# Patient Record
Sex: Male | Born: 1950 | Race: White | Hispanic: No | Marital: Married | Smoking: Never smoker
Health system: Southern US, Community
[De-identification: ages and names within clinical notes are randomized; demographics above are authoritative.]

## PROBLEM LIST (undated history)

## (undated) DIAGNOSIS — Z8719 Personal history of other diseases of the digestive system: Secondary | ICD-10-CM

## (undated) DIAGNOSIS — F329 Major depressive disorder, single episode, unspecified: Secondary | ICD-10-CM

## (undated) DIAGNOSIS — G4730 Sleep apnea, unspecified: Secondary | ICD-10-CM

## (undated) DIAGNOSIS — I10 Essential (primary) hypertension: Secondary | ICD-10-CM

## (undated) DIAGNOSIS — I77810 Thoracic aortic ectasia: Secondary | ICD-10-CM

## (undated) DIAGNOSIS — E785 Hyperlipidemia, unspecified: Secondary | ICD-10-CM

## (undated) DIAGNOSIS — R739 Hyperglycemia, unspecified: Secondary | ICD-10-CM

## (undated) DIAGNOSIS — I219 Acute myocardial infarction, unspecified: Secondary | ICD-10-CM

## (undated) DIAGNOSIS — F419 Anxiety disorder, unspecified: Secondary | ICD-10-CM

## (undated) DIAGNOSIS — R42 Dizziness and giddiness: Secondary | ICD-10-CM

## (undated) DIAGNOSIS — IMO0001 Reserved for inherently not codable concepts without codable children: Secondary | ICD-10-CM

## (undated) DIAGNOSIS — N529 Male erectile dysfunction, unspecified: Secondary | ICD-10-CM

## (undated) DIAGNOSIS — R011 Cardiac murmur, unspecified: Secondary | ICD-10-CM

## (undated) DIAGNOSIS — I712 Thoracic aortic aneurysm, without rupture: Secondary | ICD-10-CM

## (undated) DIAGNOSIS — I2510 Atherosclerotic heart disease of native coronary artery without angina pectoris: Secondary | ICD-10-CM

## (undated) DIAGNOSIS — F32A Depression, unspecified: Secondary | ICD-10-CM

## (undated) DIAGNOSIS — K219 Gastro-esophageal reflux disease without esophagitis: Secondary | ICD-10-CM

## (undated) DIAGNOSIS — E7800 Pure hypercholesterolemia, unspecified: Secondary | ICD-10-CM

## (undated) DIAGNOSIS — Q231 Congenital insufficiency of aortic valve: Secondary | ICD-10-CM

## (undated) DIAGNOSIS — I7121 Aneurysm of the ascending aorta, without rupture: Secondary | ICD-10-CM

## (undated) DIAGNOSIS — I7090 Unspecified atherosclerosis: Secondary | ICD-10-CM

## (undated) DIAGNOSIS — C801 Malignant (primary) neoplasm, unspecified: Secondary | ICD-10-CM

## (undated) HISTORY — DX: Hyperlipidemia, unspecified: E78.5

## (undated) HISTORY — DX: Congenital insufficiency of aortic valve: Q23.1

## (undated) HISTORY — DX: Dizziness and giddiness: R42

## (undated) HISTORY — DX: Unspecified atherosclerosis: I70.90

## (undated) HISTORY — DX: Hyperglycemia, unspecified: R73.9

## (undated) HISTORY — DX: Cardiac murmur, unspecified: R01.1

## (undated) HISTORY — DX: Thoracic aortic ectasia: I77.810

## (undated) HISTORY — DX: Male erectile dysfunction, unspecified: N52.9

## (undated) HISTORY — PX: CORONARY ANGIOPLASTY WITH STENT PLACEMENT: SHX49

## (undated) HISTORY — DX: Essential (primary) hypertension: I10

---

## 1997-12-07 ENCOUNTER — Other Ambulatory Visit: Admission: RE | Admit: 1997-12-07 | Discharge: 1997-12-07 | Payer: Self-pay | Admitting: Internal Medicine

## 1998-01-14 ENCOUNTER — Other Ambulatory Visit: Admission: RE | Admit: 1998-01-14 | Discharge: 1998-01-14 | Payer: Self-pay | Admitting: Gastroenterology

## 2002-06-05 ENCOUNTER — Encounter: Payer: Self-pay | Admitting: Podiatry

## 2002-06-08 ENCOUNTER — Ambulatory Visit (HOSPITAL_COMMUNITY): Admission: RE | Admit: 2002-06-08 | Discharge: 2002-06-08 | Payer: Self-pay | Admitting: Podiatry

## 2005-09-30 ENCOUNTER — Inpatient Hospital Stay (HOSPITAL_COMMUNITY): Admission: AD | Admit: 2005-09-30 | Discharge: 2005-10-03 | Payer: Self-pay | Admitting: Cardiology

## 2013-04-20 ENCOUNTER — Encounter (HOSPITAL_COMMUNITY): Payer: Self-pay | Admitting: Emergency Medicine

## 2013-04-20 ENCOUNTER — Emergency Department (HOSPITAL_COMMUNITY): Payer: BC Managed Care – PPO

## 2013-04-20 ENCOUNTER — Encounter (HOSPITAL_COMMUNITY)
Admission: EM | Disposition: A | Discharge status: Home / Self Care | Payer: BC Managed Care – PPO | Source: Home / Self Care | Attending: Cardiology

## 2013-04-20 ENCOUNTER — Inpatient Hospital Stay (HOSPITAL_COMMUNITY)
Admission: EM | Admit: 2013-04-20 | Discharge: 2013-04-21 | DRG: 246 | Disposition: A | Discharge status: Home / Self Care | Payer: BC Managed Care – PPO | Attending: Cardiology | Admitting: Cardiology

## 2013-04-20 DIAGNOSIS — F329 Major depressive disorder, single episode, unspecified: Secondary | ICD-10-CM | POA: Diagnosis present

## 2013-04-20 DIAGNOSIS — K219 Gastro-esophageal reflux disease without esophagitis: Secondary | ICD-10-CM | POA: Diagnosis present

## 2013-04-20 DIAGNOSIS — I252 Old myocardial infarction: Secondary | ICD-10-CM

## 2013-04-20 DIAGNOSIS — I2510 Atherosclerotic heart disease of native coronary artery without angina pectoris: Secondary | ICD-10-CM | POA: Diagnosis present

## 2013-04-20 DIAGNOSIS — I214 Non-ST elevation (NSTEMI) myocardial infarction: Secondary | ICD-10-CM | POA: Diagnosis present

## 2013-04-20 DIAGNOSIS — I10 Essential (primary) hypertension: Secondary | ICD-10-CM | POA: Diagnosis present

## 2013-04-20 DIAGNOSIS — F411 Generalized anxiety disorder: Secondary | ICD-10-CM | POA: Diagnosis present

## 2013-04-20 DIAGNOSIS — E785 Hyperlipidemia, unspecified: Secondary | ICD-10-CM | POA: Diagnosis present

## 2013-04-20 DIAGNOSIS — Y838 Other surgical procedures as the cause of abnormal reaction of the patient, or of later complication, without mention of misadventure at the time of the procedure: Secondary | ICD-10-CM | POA: Diagnosis present

## 2013-04-20 DIAGNOSIS — E7800 Pure hypercholesterolemia, unspecified: Secondary | ICD-10-CM | POA: Diagnosis present

## 2013-04-20 DIAGNOSIS — T82897A Other specified complication of cardiac prosthetic devices, implants and grafts, initial encounter: Principal | ICD-10-CM | POA: Diagnosis present

## 2013-04-20 DIAGNOSIS — I200 Unstable angina: Secondary | ICD-10-CM

## 2013-04-20 DIAGNOSIS — R739 Hyperglycemia, unspecified: Secondary | ICD-10-CM

## 2013-04-20 DIAGNOSIS — Z7982 Long term (current) use of aspirin: Secondary | ICD-10-CM

## 2013-04-20 DIAGNOSIS — Y92009 Unspecified place in unspecified non-institutional (private) residence as the place of occurrence of the external cause: Secondary | ICD-10-CM

## 2013-04-20 DIAGNOSIS — F3289 Other specified depressive episodes: Secondary | ICD-10-CM | POA: Diagnosis present

## 2013-04-20 HISTORY — DX: Acute myocardial infarction, unspecified: I21.9

## 2013-04-20 HISTORY — DX: Depression, unspecified: F32.A

## 2013-04-20 HISTORY — DX: Major depressive disorder, single episode, unspecified: F32.9

## 2013-04-20 HISTORY — PX: LEFT HEART CATHETERIZATION WITH CORONARY ANGIOGRAM: SHX5451

## 2013-04-20 HISTORY — DX: Reserved for inherently not codable concepts without codable children: IMO0001

## 2013-04-20 HISTORY — DX: Pure hypercholesterolemia, unspecified: E78.00

## 2013-04-20 HISTORY — DX: Anxiety disorder, unspecified: F41.9

## 2013-04-20 HISTORY — DX: Gastro-esophageal reflux disease without esophagitis: K21.9

## 2013-04-20 LAB — CBC WITH DIFFERENTIAL/PLATELET
Basophils Absolute: 0 10*3/uL (ref 0.0–0.1)
Eosinophils Relative: 1 % (ref 0–5)
HCT: 41.1 % (ref 39.0–52.0)
Hemoglobin: 14.6 g/dL (ref 13.0–17.0)
Lymphocytes Relative: 22 % (ref 12–46)
MCV: 95.1 fL (ref 78.0–100.0)
Monocytes Absolute: 0.5 10*3/uL (ref 0.1–1.0)
Monocytes Relative: 9 % (ref 3–12)
Neutro Abs: 4 10*3/uL (ref 1.7–7.7)
RDW: 13.2 % (ref 11.5–15.5)
WBC: 5.8 10*3/uL (ref 4.0–10.5)

## 2013-04-20 LAB — BASIC METABOLIC PANEL
BUN: 19 mg/dL (ref 6–23)
CO2: 24 mEq/L (ref 19–32)
Calcium: 9.1 mg/dL (ref 8.4–10.5)
Chloride: 105 mEq/L (ref 96–112)
Creatinine, Ser: 1.08 mg/dL (ref 0.50–1.35)
GFR calc non Af Amer: 73 mL/min — ABNORMAL LOW (ref >90)
Glucose, Bld: 118 mg/dL — ABNORMAL HIGH (ref 70–99)

## 2013-04-20 LAB — TSH: TSH: 1.893 u[IU]/mL (ref 0.350–4.500)

## 2013-04-20 LAB — CK TOTAL AND CKMB (NOT AT ARMC): Total CK: 130 U/L (ref 7–232)

## 2013-04-20 LAB — HEMOGLOBIN A1C
Hgb A1c MFr Bld: 5.8 % — ABNORMAL HIGH (ref <5.7)
Mean Plasma Glucose: 120 mg/dL — ABNORMAL HIGH (ref <117)

## 2013-04-20 LAB — TROPONIN I
Troponin I: 0.75 ng/mL (ref <0.30)
Troponin I: 2.52 ng/mL (ref <0.30)

## 2013-04-20 LAB — MRSA PCR SCREENING: MRSA by PCR: NEGATIVE

## 2013-04-20 SURGERY — LEFT HEART CATHETERIZATION WITH CORONARY ANGIOGRAM
Anesthesia: LOCAL

## 2013-04-20 MED ORDER — CARVEDILOL 3.125 MG PO TABS
3.1250 mg | ORAL_TABLET | Freq: Two times a day (BID) | ORAL | Status: DC
  Administered 2013-04-20 – 2013-04-21 (×2): 3.125 mg via ORAL
  Filled 2013-04-20 (×5): qty 1

## 2013-04-20 MED ORDER — ASPIRIN EC 81 MG PO TBEC
81.0000 mg | DELAYED_RELEASE_TABLET | Freq: Every day | ORAL | Status: DC
  Administered 2013-04-21: 81 mg via ORAL
  Filled 2013-04-20 (×2): qty 1

## 2013-04-20 MED ORDER — SODIUM CHLORIDE 0.9 % IV SOLN: 1.0000 mL/kg/h | INTRAVENOUS | Status: AC

## 2013-04-20 MED ORDER — PANTOPRAZOLE SODIUM 40 MG PO TBEC
40.0000 mg | DELAYED_RELEASE_TABLET | Freq: Every day | ORAL | Status: DC
  Administered 2013-04-21: 09:00:00 40 mg via ORAL
  Filled 2013-04-20: qty 1

## 2013-04-20 MED ORDER — FENTANYL CITRATE 0.05 MG/ML IJ SOLN: 50.0000 ug | Freq: Four times a day (QID) | INTRAMUSCULAR | Status: DC | PRN

## 2013-04-20 MED ORDER — BIVALIRUDIN 250 MG IV SOLR
INTRAVENOUS | Status: AC
  Filled 2013-04-20: qty 250

## 2013-04-20 MED ORDER — VERAPAMIL HCL 2.5 MG/ML IV SOLN
INTRAVENOUS | Status: AC
  Filled 2013-04-20: qty 2

## 2013-04-20 MED ORDER — HEPARIN BOLUS VIA INFUSION
4000.0000 [IU] | Freq: Once | INTRAVENOUS | Status: AC
  Administered 2013-04-20: 4000 [IU] via INTRAVENOUS

## 2013-04-20 MED ORDER — TICAGRELOR 90 MG PO TABS
ORAL_TABLET | ORAL | Status: AC
  Filled 2013-04-20: qty 2

## 2013-04-20 MED ORDER — ZOLPIDEM TARTRATE 5 MG PO TABS: 5.0000 mg | ORAL_TABLET | Freq: Every evening | ORAL | Status: DC | PRN

## 2013-04-20 MED ORDER — SODIUM CHLORIDE 0.9 % IV BOLUS (SEPSIS)
500.0000 mL | Freq: Once | INTRAVENOUS | Status: AC
  Administered 2013-04-20: 500 mL via INTRAVENOUS

## 2013-04-20 MED ORDER — ENALAPRILAT 1.25 MG/ML IV SOLN
INTRAVENOUS | Status: AC
  Filled 2013-04-20: qty 1

## 2013-04-20 MED ORDER — SODIUM CHLORIDE 0.9 % IV SOLN
INTRAVENOUS | Status: DC
  Administered 2013-04-20: 16:00:00 via INTRAVENOUS

## 2013-04-20 MED ORDER — SERTRALINE HCL 50 MG PO TABS
50.0000 mg | ORAL_TABLET | Freq: Every day | ORAL | Status: DC
  Administered 2013-04-20: 50 mg via ORAL
  Filled 2013-04-20 (×2): qty 1

## 2013-04-20 MED ORDER — SIMVASTATIN 40 MG PO TABS
40.0000 mg | ORAL_TABLET | Freq: Every day | ORAL | Status: DC
  Administered 2013-04-21: 40 mg via ORAL
  Filled 2013-04-20 (×2): qty 1

## 2013-04-20 MED ORDER — NITROGLYCERIN 0.2 MG/ML ON CALL CATH LAB
INTRAVENOUS | Status: AC
  Filled 2013-04-20: qty 1

## 2013-04-20 MED ORDER — MIDAZOLAM HCL 2 MG/2ML IJ SOLN
INTRAMUSCULAR | Status: AC
  Filled 2013-04-20: qty 2

## 2013-04-20 MED ORDER — LIDOCAINE HCL (PF) 1 % IJ SOLN
INTRAMUSCULAR | Status: AC
  Filled 2013-04-20: qty 30

## 2013-04-20 MED ORDER — ONDANSETRON HCL 4 MG/2ML IJ SOLN: 4.0000 mg | Freq: Four times a day (QID) | INTRAMUSCULAR | Status: DC | PRN

## 2013-04-20 MED ORDER — HEPARIN (PORCINE) IN NACL 2-0.9 UNIT/ML-% IJ SOLN
INTRAMUSCULAR | Status: AC
  Filled 2013-04-20: qty 1000

## 2013-04-20 MED ORDER — HYDROMORPHONE HCL PF 2 MG/ML IJ SOLN
INTRAMUSCULAR | Status: AC
  Filled 2013-04-20: qty 1

## 2013-04-20 MED ORDER — PNEUMOCOCCAL VAC POLYVALENT 25 MCG/0.5ML IJ INJ
0.5000 mL | INJECTION | INTRAMUSCULAR | Status: AC
  Administered 2013-04-21: 0.5 mL via INTRAMUSCULAR
  Filled 2013-04-20: qty 0.5

## 2013-04-20 MED ORDER — HEPARIN (PORCINE) IN NACL 100-0.45 UNIT/ML-% IJ SOLN
1100.0000 [IU]/h | INTRAMUSCULAR | Status: DC
  Administered 2013-04-20: 1100 [IU]/h via INTRAVENOUS
  Filled 2013-04-20: qty 250

## 2013-04-20 MED ORDER — ASPIRIN 81 MG PO CHEW
324.0000 mg | CHEWABLE_TABLET | Freq: Once | ORAL | Status: AC
  Administered 2013-04-20: 324 mg via ORAL
  Filled 2013-04-20: qty 4

## 2013-04-20 MED ORDER — SODIUM CHLORIDE 0.9 % IJ SOLN: 3.0000 mL | INTRAMUSCULAR | Status: DC | PRN

## 2013-04-20 MED ORDER — MORPHINE SULFATE 2 MG/ML IJ SOLN
2.0000 mg | Freq: Once | INTRAMUSCULAR | Status: AC
  Administered 2013-04-20: 2 mg via INTRAVENOUS
  Filled 2013-04-20: qty 1

## 2013-04-20 MED ORDER — NITROGLYCERIN 0.4 MG SL SUBL
0.4000 mg | SUBLINGUAL_TABLET | SUBLINGUAL | Status: DC | PRN
  Administered 2013-04-20 (×2): 0.4 mg via SUBLINGUAL
  Filled 2013-04-20: qty 25

## 2013-04-20 MED ORDER — NITROGLYCERIN 2 % TD OINT
1.0000 [in_us] | TOPICAL_OINTMENT | Freq: Once | TRANSDERMAL | Status: AC
  Administered 2013-04-20: 1 [in_us] via TOPICAL
  Filled 2013-04-20: qty 1

## 2013-04-20 MED ORDER — ACETAMINOPHEN 325 MG PO TABS: 650.0000 mg | ORAL_TABLET | ORAL | Status: DC | PRN

## 2013-04-20 MED ORDER — TICAGRELOR 90 MG PO TABS
90.0000 mg | ORAL_TABLET | Freq: Two times a day (BID) | ORAL | Status: DC
  Administered 2013-04-21: 09:00:00 90 mg via ORAL
  Filled 2013-04-20 (×3): qty 1

## 2013-04-20 MED ORDER — ALPRAZOLAM 0.25 MG PO TABS
0.2500 mg | ORAL_TABLET | Freq: Two times a day (BID) | ORAL | Status: DC | PRN
  Administered 2013-04-21: 01:00:00 0.25 mg via ORAL
  Filled 2013-04-20: qty 1

## 2013-04-20 MED ORDER — NITROGLYCERIN IN D5W 200-5 MCG/ML-% IV SOLN
3.0000 ug/min | INTRAVENOUS | Status: DC
  Administered 2013-04-20: 10 ug/min via INTRAVENOUS

## 2013-04-20 MED ORDER — SODIUM CHLORIDE 0.9 % IJ SOLN: 3.0000 mL | Freq: Two times a day (BID) | INTRAMUSCULAR | Status: DC

## 2013-04-20 MED ORDER — SODIUM CHLORIDE 0.9 % IV SOLN: 250.0000 mL | INTRAVENOUS | Status: DC | PRN

## 2013-04-20 NOTE — ED Notes (Signed)
Pt states intermittent center chest pain began ~2 mo ago. States sometimes shoulders and arms will feel different. Pain described as "squeezing". Pt has taken nitro sl x 4 throughout  the night. Pain began again this morning at 0100.

## 2013-04-20 NOTE — CV Procedure (Signed)
Procedure performed:  Coronary angiogram, Selective right and left coronary arteriography. PTCA and stenting of the proximal LAD with implantation of a drug-eluting 4.0 x 28 mm promos Premier stent for late stent thrombosis, PTCA and stenting of the proximal circumflex coronary artery with implantation of a 4.0 x 20 mm promos drug-eluting stent for ulcerated high-grade 90% stenosis.  Indication: Patient is a 62 year-old male with history of hypertension,  hyperlipidemia, known coronary artery disease and he had presented with what appears second non-ST elevation myocardial infarction in 2007 and had undergone angioplasty to his proximal and mid LAD. He had been doing well until 3-4 months ago had started to notice exertional angina pectoris, but last 3 days he's been having rest pain. He was admitted for unstable angina pectoris. He was then brought directly to the coronary angiography suite due to ongoing chest discomfort.   Hemodynamic data: Aortic pressure was 91/73 with a mean of 80 mm mercury.   Left ventricle: Not Performed.  Right coronary artery: The vessel is smooth,  Dominant, gives origin to his RV marginal branch, the RV branch has a high-grade 95% stenosis unchanged from prior coronary angiography in 2007. The RCA has mild luminal irregularity.  Left main coronary artery is large and normal.  Circumflex coronary artery: A large vessel giving origin to a small OM1, a moderate limp 2 and cut this is a large OM 3. The proximal segment has a ulcerated 90% stenosis.  Ramus intermediate: It is a moderate to large caliber vessel, with mild luminal irregularity.  LAD:  LAD gives origin to a large diagonal-1.  The previously placed mid LAD stent is occluded. The D1 has a ostial 30-40% stenosis and proximal mild luminal irregularity. Distal LAD has mild diffuse luminal irregularity.   Impression: I suspect the circumflex coronary artery to be the culprit, however LAD appears to be freshly  occluded and there were no collaterals. Hence we'll proceed with intervention to both LAD and circumflex coronary artery.  Interventional data: Successful PTCA and stenting of the proximal LAD with implantation of a 4.0 x 28 mm promos Premier drug-eluting stent. Stenosis reduced from 100% to 0% with improvement in TIMI flow from TIMI 0 to TIMI 3 flow.  Successful PTCA and stenting of the proximal circumflex coronary artery implantation of a 4.0 x 20 mm promos Premier drug-eluting stent. Stenosis reduced from ulcerated 90% to 0% with improvement in TIMI flow from TIMI 2 to TIMI-3 at the end of the procedure. Will need Dual antiplatelet therapy with BRILINTA and ASA 81 mg for at least one year or more.   Recommendation: Patient has large descending aorta, and this was noted previously also, I had mild difficulty in crossing the LV, and I gave up the attempt to cross the should proceed with coronary intervention on an urgent basis. 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  Technique of diagnostic cardiac catheterization:  Under sterile precautions using a 6 French right radial  arterial access, a 6 French sheath was introduced into the right radial artery. A 5 [place] Tig 4 catheter was advanced into the ascending aorta selective, left coronary artery was cannulated and angiography was performed in multiple views. The catheter was pulled back Out of the body over exchange length J-wire. A 5 French Judkins right 4 diagnostic was utilized in the right coronary artery. Then I attempted to cross the aortic valve with a 5 French pigtail catheter,  this procedure was abandoned.  Catheter exchanged out of the body over J-Wire. NO immediate complications noted.   Technique of intervention:  Using a 6 [place] XB 4 guide catheter the left main  coronary  was selected and cannulated. Using Angiomax for anticoagulation, I utilized a  cougar guidewire and across the occluded LAD coronary artery without any difficulty. I placed the tip of the wire into the distal  coronary artery. Angiography was performed.   Then I utilized a 2.5 x 15 mm splinter Legend balloon , I performed balloon angioplasty at peak of 14 atmospheric pressure for 40 seconds, multiple balloon inflations at 10 4 in 14 were also performed for 40 seconds each. TIMI-3 flow was established in the LAD. I proceeded with implantation of  drug-eluting stent into the proximal LAD coronary artery. The stent was deployed at 11 atmospheric pressure for 55  seconds. The stent was then post dilated with a 4.0 x 20 mm [place]  Trek balloon 14 atmospheric pressure for 45 seconds. Post-balloon angioplasty results were excellent with 0% residual stenoses and TIMI-3 flow was maintained. There was no evidence of edge dissection. The guidewire was withdrawn out of the body and the guide catheter was engaged and pulled out of the body over the J-wire, I could not cannulate and via the circumflex coronary artery with this guide, hence I utilized a  32F Voda Left 4 guide catheter was utilized and using the same cougar wire was able to then cannulate the circumflex coronary artery, I then predilated with a 2.5 x 15 mm splinter Legend at 8 atmospheric pressure for 45 seconds. This was followed by implantation of a drug-eluting 4.0 x 20 mm promos Premier stent which was deployed at 12 atmospheric pressure for 60 seconds followed by angiography. Patient received intracoronary nitroglycerin during the procedure. Excellent result was evident. The wire was withdrawn, angiography repeated guide catheter disengaged and pulled out of the body. This was done over J-wire. There was no immediate complication. Hemostasis achieved with TR band. Patient tolerated the procedure well. A total of 250 cc of contrast was utilized for diagnostic and interventional procedure.

## 2013-04-20 NOTE — ED Notes (Signed)
Pt pale/diaphoretic and c/o dizziness. bp-85/64, hr-45 and increased to 60. edp notifed. iv access obtained and fluid bolus started.pt awake, alert and oriented. nad noted.

## 2013-04-20 NOTE — ED Provider Notes (Signed)
CSN: [id number]     Arrival date & time 04/20/13  1000 History  This chart was scribed for Joya Gaskins, MD by Bennett Scrape, ED Scribe. This patient was seen in room APA18/APA18 and the patient's care was started at 10:27 AM.   Chief Complaint  Patient presents with  . Chest Pain    Patient is a 62 y.o. male presenting with chest pain. The history is provided by the patient. No language interpreter was used.  Chest Pain Pain location:  Substernal area Pain quality comment:  Squeezing  Pain radiates to:  Does not radiate Pain radiates to the back: no   Onset quality:  Gradual Duration:  3 days Timing:  Constant Relieved by:  Nitroglycerin and aspirin Worsened by:  Movement and exertion Associated symptoms: shortness of breath (baseline, no changes) and weakness (one episode that resolved.)   Associated symptoms: no cough, no fever, no nausea and not vomiting     HPI Comments: MITCHELLE GOERNER is a 62 y.o. male who presents to the Emergency Department complaining of 3 days of constant substernal CP. He denies radiation and reports that the pain is waxing and waning. He states that CP is aggravated mildly by exertion and movement. He denies any changes with deep breathing. He states that he has been taking NTG with improvement. He also has been taking 81 mg ASA daily with improvement. He states that he feels SOB at baseline with exertion and denies any changes. He reports one episode of weakness during the initial onset that resolved with rest. He denies any further episodes. He denies any syncope or nausea. He has a h/o prior MI and reports similarities. He denies taking an ASA today. He states that his pain has been controlled for the past few years after MI. He admits that he has traveled around 90 miles in the past week but denies any leg pain or swelling.  Past Medical History  Diagnosis Date  . MI (myocardial infarction)   . Hypercholesterolemia   . Reflux   . Depression    . Anxiety    Past Surgical History  Procedure Laterality Date  . Coronary angioplasty with stent placement     No family history on file. History  Substance Use Topics  . Smoking status: Never Smoker   . Smokeless tobacco: Not on file  . Alcohol Use: No    Review of Systems  Constitutional: Negative for fever.  Respiratory: Positive for shortness of breath (baseline, no changes). Negative for cough.   Cardiovascular: Positive for chest pain.  Gastrointestinal: Negative for nausea and vomiting.  Neurological: Positive for weakness (one episode that resolved.). Negative for syncope.  All other systems reviewed and are negative.    Allergies  Review of patient's allergies indicates no known allergies.  Home Medications   Current Outpatient Rx  Name  Route  Sig  Dispense  Refill  . aspirin EC 81 MG tablet   Oral   Take 81 mg by mouth at bedtime.         . carvedilol (COREG) 6.25 MG tablet   Oral   Take 6.25 mg by mouth 2 (two) times daily with a meal.         . lovastatin (MEVACOR) 40 MG tablet   Oral   Take 40 mg by mouth at bedtime.         Marland Kitchen omeprazole (PRILOSEC) 40 MG capsule   Oral   Take 40 mg by mouth daily.         Marland Kitchen  sertraline (ZOLOFT) 50 MG tablet   Oral   Take 50 mg by mouth at bedtime.           Triage Vitals: BP 123/66  Pulse 70  Temp(Src) 98.1 F (36.7 C) (Oral)  Resp 17  Ht 6\' 1"  (1.854 m)  Wt 250 lb (113.399 kg)  BMI 32.99 kg/m2  SpO2 96% BP 127/86  Pulse 61  Temp(Src) 98.1 F (36.7 C) (Oral)  Resp 18  Ht 6\' 1"  (1.854 m)  Wt 250 lb (113.399 kg)  BMI 32.99 kg/m2  SpO2 96%   Physical Exam  Nursing note and vitals reviewed.  CONSTITUTIONAL: Well developed/well nourished HEAD: Normocephalic/atraumatic EYES: EOMI/PERRL ENMT: Mucous membranes moist NECK: supple no meningeal signs SPINE:entire spine nontender CV: S1/S2 noted, no murmurs/rubs/gallops noted LUNGS: Lungs are clear to auscultation bilaterally, no apparent  distress ABDOMEN: soft, nontender, no rebound or guarding GU:no cva tenderness NEURO: Pt is awake/alert, moves all extremitiesx4 EXTREMITIES: pulses normal, full ROM SKIN: warm, color normal PSYCH: no abnormalities of mood noted   ED Course  Procedures (including critical care time) CRITICAL CARE Performed by: Joya Gaskins Total critical care time: 33 Critical care time was exclusive of separately billable procedures and treating other patients. Critical care was necessary to treat or prevent imminent or life-threatening deterioration. Critical care was time spent personally by me on the following activities: development of treatment plan with patient and/or surrogate as well as nursing, discussions with consultants, evaluation of patient's response to treatment, examination of patient, obtaining history from patient or surrogate, ordering and performing treatments and interventions, ordering and review of laboratory studies, ordering and review of radiographic studies, pulse oximetry and re-evaluation of patient's condition.   Medications  nitroGLYCERIN (NITROSTAT) SL tablet 0.4 mg (0.4 mg Sublingual Given 04/20/13 1055)  heparin bolus via infusion 4,000 Units (0 Units Intravenous Stopped 04/20/13 1247)    Followed by  heparin ADULT infusion 100 units/mL (25000 units/250 mL) (1,100 Units/hr Intravenous New Bag/Given 04/20/13 1243)  morphine 2 MG/ML injection 2 mg (not administered)  aspirin chewable tablet 324 mg (324 mg Oral Given 04/20/13 1049)  sodium chloride 0.9 % bolus 500 mL (0 mLs Intravenous Stopped 04/20/13 1213)    DIAGNOSTIC STUDIES: Oxygen Saturation is 96% on room air, adequate by my interpretation.    COORDINATION OF CARE: 10:32 AM-Discussed treatment plan which includes medications, CXR, CBC panel and BMP with pt at bedside and pt agreed to plan.    1:05 PM Pt now pain free (had brief hypotension following NTG, none now) Given h/o CAD and increasing  symptoms will admit D/w dr Jacinto Halim at [facility] who has managed patient previously Recommend starting heparin Pt denies any recent issues with GI bleed Pt has been given NTG and heparin/ASA Will admit for unstable angina Labs Review Labs Reviewed  BASIC METABOLIC PANEL - Abnormal; Notable for the following:    Glucose, Bld 118 (*)    GFR calc non Af Amer 73 (*)    GFR calc Af Amer 84 (*)    All other components within normal limits  CBC WITH DIFFERENTIAL  HEPARIN LEVEL (UNFRACTIONATED)  POCT I-STAT TROPONIN I   Imaging Review Dg Chest Portable 1 View  04/20/2013   CLINICAL DATA:  Chest pain.  EXAM: PORTABLE CHEST - 1 VIEW  COMPARISON:  10/02/2005.  FINDINGS: Trachea is midline. Heart size stable and accentuated by AP technique. Slight accentuation of the pulmonary markings may be technical in etiology. No pleural fluid.  IMPRESSION: Slight accentuation of the  pulmonary markings may be technical in etiology. No definite acute findings.   Electronically Signed   By: Leanna Battles M.D.   On: 04/20/2013 11:02    EKG Interpretation    Date/Time:    Ventricular Rate:  72 PR Interval:  182 QRS Duration: 84 QT Interval:  400 QTC Calculation: 438 R Axis:     Text Interpretation:  Normal sinus rhythm Nonspecific T wave abnormality Abnormal ECG When compared with ECG of 02-Oct-2005 05:43, Nonspecific T wave abnormality has replaced inverted T waves in Anterior leads            MDM   1. Unstable angina    Nursing notes including past medical history and social history reviewed and considered in documentation ,xrays reviewed and considered Labs/vital reviewed and considered   I personally performed the services described in this documentation, which was scribed in my presence. The recorded information has been reviewed and is accurate.      Joya Gaskins, MD 04/20/13 [phone]

## 2013-04-20 NOTE — Interval H&P Note (Signed)
History and Physical Interval Note:  04/20/2013 5:37 PM  Devon Sanchez  has presented today for surgery, with the diagnosis of [place]  The various methods of treatment have been discussed with the patient and family. After consideration of risks, benefits and other options for treatment, the patient has consented to  Procedure(s): LEFT HEART CATHETERIZATION WITH CORONARY ANGIOGRAM (N/A) and possible PCI as a surgical intervention .  The patient's history has been reviewed, patient examined, no change in status, stable for surgery.  I have reviewed the patient's chart and labs.  Questions were answered to the patient's satisfaction.   Cath Lab Visit (complete for each Cath Lab visit)  Clinical Evaluation Leading to the Procedure:   ACS: yes  Non-ACS:    Anginal Classification: CCS IV  Anti-ischemic medical therapy: Maximal Therapy (2 or more classes of medications)  Non-Invasive Test Results: No non-invasive testing performed  Prior CABG: No previous CABG         [facility]

## 2013-04-20 NOTE — H&P (Signed)
Devon Sanchez is an 62 y.o. male.   Chief Complaint: Chest pain HPI: Patient is a 62 year old Caucasian male with history of hypertension, hyperlipidemia and history of myocardial infarction in 2007, history of coronary angioplasty, details of which amenable to extract from the medical chart, were been doing well until September 2013, started experiencing heaviness in the middle of the chest similar to his angina pectoris. Over the past one month symptoms are worse and more frequent. For the past 3 days he''s been having rest angina and has taken sublingual nitroglycerin with partial relief of chest pain. He presented to emergency room with chest pain 2 and he then hospital day he was then transferred to [facility] for further evaluation.  On IV nitroglycerin and heparin, his chest pain improved from 6-7 down to 1-2. He is also noticed mild dyspnea on exertion. No other associated symptoms. No recent weight changes. Is not a diabetic. He's been taking all his medications as usual.  Past Medical History  Diagnosis Date  . MI (myocardial infarction)   . Hypercholesterolemia   . Reflux   . Depression   . Anxiety     Past Surgical History  Procedure Laterality Date  . Coronary angioplasty with stent placement      No family history on file. Social History:  reports that he has never smoked. He does not have any smokeless tobacco history on file. He reports that he does not drink alcohol. His drug history is not on file.  Allergies: No Known Allergies  Medications Prior to Admission  Medication Sig Dispense Refill  . aspirin EC 81 MG tablet Take 81 mg by mouth at bedtime.      . carvedilol (COREG) 6.25 MG tablet Take 6.25 mg by mouth 2 (two) times daily with a meal.      . lovastatin (MEVACOR) 40 MG tablet Take 40 mg by mouth at bedtime.      Marland Kitchen omeprazole (PRILOSEC) 40 MG capsule Take 40 mg by mouth daily.      . sertraline (ZOLOFT) 50 MG tablet Take 50 mg by mouth at bedtime.           Review of Systems - Negative except Recent chest pain and dyspnea. Denies symptoms of claudication. Denies symptoms of TIA.  Blood pressure 102/74, pulse 66, temperature 97.5 F (36.4 C), temperature source Oral, resp. rate 14, height 6\' 1"  (1.854 m), weight 112.1 kg (247 lb 2.2 oz), SpO2 98.00%. General appearance: alert, cooperative, appears stated age, no distress and mildly obese Eyes: negative findings: lids and lashes normal Neck: no adenopathy, no carotid bruit, no JVD, supple, symmetrical, trachea midline and thyroid not enlarged, symmetric, no tenderness/mass/nodules Neck: JVP - normal, carotids 2+= without bruits Resp: clear to auscultation bilaterally Chest wall: no tenderness Cardio: S1 and S2 is normal there is a 1-2/6 early systolic murmur in the aortic area. There is no gallop. GI: soft, non-tender; bowel sounds normal; no masses,  no organomegaly Extremities: extremities normal, atraumatic, no cyanosis or edema Pulses: 2+ and symmetric Skin: Skin color, texture, turgor normal. No rashes or lesions Neurologic: Grossly normal  Results for orders placed during the hospital encounter of 04/20/13 (from the past 48 hour(s))  CBC WITH DIFFERENTIAL     Status: None   Collection Time    04/20/13 10:43 AM      Result Value Range   WBC 5.8  4.0 - 10.5 K/uL   RBC 4.32  4.22 - 5.81 MIL/uL   Hemoglobin  14.6  13.0 - 17.0 g/dL   HCT 96.0  45.4 - 09.8 %   MCV 95.1  78.0 - 100.0 fL   MCH 33.8  26.0 - 34.0 pg   MCHC 35.5  30.0 - 36.0 g/dL   RDW 11.9  14.7 - 82.9 %   Platelets 204  150 - 400 K/uL   Neutrophils Relative % 68  43 - 77 %   Neutro Abs 4.0  1.7 - 7.7 K/uL   Lymphocytes Relative 22  12 - 46 %   Lymphs Abs 1.3  0.7 - 4.0 K/uL   Monocytes Relative 9  3 - 12 %   Monocytes Absolute 0.5  0.1 - 1.0 K/uL   Eosinophils Relative 1  0 - 5 %   Eosinophils Absolute 0.1  0.0 - 0.7 K/uL   Basophils Relative 0  0 - 1 %   Basophils Absolute 0.0  0.0 - 0.1 K/uL  BASIC  METABOLIC PANEL     Status: Abnormal   Collection Time    04/20/13 10:43 AM      Result Value Range   Sodium 139  135 - 145 mEq/L   Potassium 4.0  3.5 - 5.1 mEq/L   Chloride 105  96 - 112 mEq/L   CO2 24  19 - 32 mEq/L   Glucose, Bld 118 (*) 70 - 99 mg/dL   BUN 19  6 - 23 mg/dL   Creatinine, Ser 5.62  0.50 - 1.35 mg/dL   Calcium 9.1  8.4 - 13.0 mg/dL   GFR calc non Af Amer 73 (*) >90 mL/min   GFR calc Af Amer 84 (*) >90 mL/min   Comment: (NOTE)     The eGFR has been calculated using the CKD EPI equation.     This calculation has not been validated in all clinical situations.     eGFR's persistently <90 mL/min signify possible Chronic Kidney     Disease.  POCT I-STAT TROPONIN I     Status: None   Collection Time    04/20/13 10:47 AM      Result Value Range   Troponin i, poc 0.04  0.00 - 0.08 ng/mL   Comment 3            Comment: Due to the release kinetics of cTnI,     a negative result within the first hours     of the onset of symptoms does not rule out     myocardial infarction with certainty.     If myocardial infarction is still suspected,     repeat the test at appropriate intervals.  MRSA PCR SCREENING     Status: None   Collection Time    04/20/13  2:29 PM      Result Value Range   MRSA by PCR NEGATIVE  NEGATIVE   Comment:            The GeneXpert MRSA Assay (FDA     approved for NASAL specimens     only), is one component of a     comprehensive MRSA colonization     surveillance program. It is not     intended to diagnose MRSA     infection nor to guide or     monitor treatment for     MRSA infections.   Dg Chest Portable 1 View  04/20/2013   CLINICAL DATA:  Chest pain.  EXAM: PORTABLE CHEST - 1 VIEW  COMPARISON:  10/02/2005.  FINDINGS: Trachea is  midline. Heart size stable and accentuated by AP technique. Slight accentuation of the pulmonary markings may be technical in etiology. No pleural fluid.  IMPRESSION: Slight accentuation of the pulmonary markings may  be technical in etiology. No definite acute findings.   Electronically Signed   By: Leanna Battles M.D.   On: 04/20/2013 11:02    Labs:   Lab Results  Component Value Date   WBC 5.8 04/20/2013   HGB 14.6 04/20/2013   HCT 41.1 04/20/2013   MCV 95.1 04/20/2013   PLT 204 04/20/2013    Recent Labs Lab 04/20/13 1043  NA 139  K 4.0  CL 105  CO2 24  BUN 19  CREATININE 1.08  CALCIUM 9.1  GLUCOSE 118*   Cardiac Panel (last 3 results) No results found for this basename: CKTOTAL, CKMB, TROPONINI, RELINDX,  in the last 72 hours  EKG: Normal sinus rhythm at a rate of 72 beats a minute, inferior infarct, old cannot exclude true posterior infarct, old nonspecific T. abnormality, cannot exclude ischemia.  Assessment/Plan 1. Unstable angina pectoris 2. Coronary artery disease and history of myocardial infarction, history of PTCA in 2007. No further cardiac followup. 3. Hypertension 4. Hyperlipidemia  Recommendation: Patient symptoms are very similar to his angina pectoris prior to his coronary angioplasty in 2007. Hence given his ongoing chest discomfort we decided to proceed with coronary angiography. Patient understands to less than 1% risk of that, stroke, MI, need for CABG but not limited to these as major competition. Further conditions will follow following coronary angiography.  Pamella Pert, MD 04/20/2013, 5:30 PM [facility] Pager: [phone] Office: [phone] If no answer: Cell:  [phone]

## 2013-04-20 NOTE — Progress Notes (Signed)
ANTICOAGULATION CONSULT NOTE - Initial Consult  Pharmacy Consult for Heparin Indication: chest pain/ACS  No Known Allergies  Patient Measurements: Height: 6\' 1"  (185.4 cm) Weight: 250 lb (113.399 kg) IBW/kg (Calculated) : 79.9 Heparin Dosing Weight: 105 kg  Vital Signs: Temp: 98.1 F (36.7 C) (11/17 1012) Temp src: Oral (11/17 1012) BP: 113/85 mmHg (11/17 1200) Pulse Rate: 56 (11/17 1200)  Labs:  Recent Labs  04/20/13 1043  HGB 14.6  HCT 41.1  PLT 204  CREATININE 1.08    Estimated Creatinine Clearance: 96 ml/min (by C-G formula based on Cr of 1.08).   Medical History: Past Medical History  Diagnosis Date  . MI (myocardial infarction)   . Hypercholesterolemia   . Reflux   . Depression   . Anxiety     Medications:  Scheduled:    Assessment: 62 yo M with chest pain.  CBC reviewed.   Goal of Therapy:  Heparin level 0.3-0.7 units/ml Monitor platelets by anticoagulation protocol: Yes   Plan:  Give 4000 units bolus x 1 Start heparin infusion at 1100 units/hr Check anti-Xa level in 6 hours and daily while on heparin Continue to monitor H&H and platelets  Cristabel Bicknell, [facility] 04/20/2013,12:26 PM

## 2013-04-21 DIAGNOSIS — I214 Non-ST elevation (NSTEMI) myocardial infarction: Secondary | ICD-10-CM

## 2013-04-21 DIAGNOSIS — R739 Hyperglycemia, unspecified: Secondary | ICD-10-CM

## 2013-04-21 DIAGNOSIS — E785 Hyperlipidemia, unspecified: Secondary | ICD-10-CM

## 2013-04-21 DIAGNOSIS — I2510 Atherosclerotic heart disease of native coronary artery without angina pectoris: Secondary | ICD-10-CM

## 2013-04-21 LAB — BASIC METABOLIC PANEL
BUN: 12 mg/dL (ref 6–23)
CO2: 22 mEq/L (ref 19–32)
Chloride: 104 mEq/L (ref 96–112)
Creatinine, Ser: 1.02 mg/dL (ref 0.50–1.35)
GFR calc non Af Amer: 78 mL/min — ABNORMAL LOW (ref >90)
Potassium: 3.8 mEq/L (ref 3.5–5.1)

## 2013-04-21 LAB — CBC
HCT: 38.9 % — ABNORMAL LOW (ref 39.0–52.0)
MCV: 95.1 fL (ref 78.0–100.0)
Platelets: 179 10*3/uL (ref 150–400)
RBC: 4.09 MIL/uL — ABNORMAL LOW (ref 4.22–5.81)
WBC: 8 10*3/uL (ref 4.0–10.5)

## 2013-04-21 MED ORDER — ATORVASTATIN CALCIUM 40 MG PO TABS: 40.0000 mg | ORAL_TABLET | Freq: Every day | ORAL | Status: DC

## 2013-04-21 MED ORDER — NITROGLYCERIN 0.4 MG SL SUBL: 0.4000 mg | SUBLINGUAL_TABLET | SUBLINGUAL | Status: DC | PRN

## 2013-04-21 MED ORDER — RAMIPRIL 5 MG PO CAPS: 5.0000 mg | ORAL_CAPSULE | Freq: Every day | ORAL | Status: DC

## 2013-04-21 MED ORDER — RAMIPRIL 5 MG PO CAPS
5.0000 mg | ORAL_CAPSULE | Freq: Every day | ORAL | Status: DC
  Administered 2013-04-21: 14:00:00 5 mg via ORAL
  Filled 2013-04-21: qty 1

## 2013-04-21 MED ORDER — TICAGRELOR 90 MG PO TABS: 90.0000 mg | ORAL_TABLET | Freq: Two times a day (BID) | ORAL | Status: DC

## 2013-04-21 MED ORDER — CARVEDILOL 6.25 MG PO TABS
6.2500 mg | ORAL_TABLET | Freq: Two times a day (BID) | ORAL | Status: DC
  Filled 2013-04-21 (×2): qty 1

## 2013-04-21 MED FILL — Sodium Chloride IV Soln 0.9%: INTRAVENOUS | Qty: 50 | Status: AC

## 2013-04-21 NOTE — Progress Notes (Signed)
Utilization Review Completed Raegyn Renda J. Almedia Cordell, RN, BSN, NCM [phone]  

## 2013-04-21 NOTE — Care Management Note (Signed)
    Page 1 of 1   04/21/2013     11:35:58 AM   CARE MANAGEMENT NOTE 04/21/2013  Patient:  Devon Sanchez, Devon Sanchez   Account Number:  [id number]  Date Initiated:  04/21/2013  Documentation initiated by:  Oletta Cohn  Subjective/Objective Assessment:   62 y.o. male.   Chief Complaint: Chest pain// home alone     Action/Plan:   heart cath with PCI//return home, self care; benefits check for Brilinta   Anticipated DC Date:  04/21/2013   Anticipated DC Plan:  HOME/SELF CARE      DC Planning Services  CM consult      Choice offered to / List presented to:             Status of service:   Medicare Important Message given?   (If response is "NO", the following Medicare IM given date fields will be blank) Date Medicare IM given:   Date Additional Medicare IM given:    Discharge Disposition:    Per UR Regulation:    If discussed at Long Length of Stay Meetings, dates discussed:    Comments:  04/17/14 1110 Oletta Cohn, RN, BSN, [employer] [phone] Spoke with pt at bedside regarding benefits check for Brilinta 90mg  BID.  Pt has brochure with 30 day free card and refill assistance card intact.  Pt utilizes [facility] in [place] for prescription needs.  NCM called pharmacy to confirm availability of medication. Information relayed to pt.  Pt verbalizes importance of filling medication upon discharge.

## 2013-04-21 NOTE — Progress Notes (Signed)
TR BAND REMOVAL  LOCATION:    right radial  DEFLATED PER PROTOCOL:    yes  TIME BAND OFF / DRESSING APPLIED:    23:00   SITE UPON ARRIVAL:    Level 0  SITE AFTER BAND REMOVAL:    Level 0  REVERSE ALLEN'S TEST:     positive  CIRCULATION SENSATION AND MOVEMENT:    Within Normal Limits   yes  COMMENTS:    

## 2013-04-21 NOTE — Discharge Summary (Signed)
Physician Discharge Summary  Patient ID: Devon Sanchez MRN: [id number] DOB/AGE: Jan 13, 1951 62 y.o.  Admit date: 04/20/2013 Discharge date: 04/21/2013  Primary Discharge Diagnosis 1. Non-ST elevation myocardial infarction,  PTCA and stenting of the proximal LAD with implantation of a 4.0 x 28 mm promos Premier drug-eluting stent. Successful PTCA and stenting of the proximal circumflex coronary artery implantation of a 4.0 x 20 mm promos Premier drug-eluting stent. 2. Coronary artery disease of the native vessels  Secondary Discharge Diagnosis 3. Hyperglycemia 4. Hyperlipidemia  Significant Diagnostic Studies: 04/20/2013: Left ventricle: Not Performed.  Right coronary artery: The vessel is smooth, Dominant, gives origin to his RV marginal branch, the RV branch has a high-grade 95% stenosis unchanged from prior coronary angiography in 2007. The RCA has mild luminal irregularity.  Left main coronary artery is large and normal.  Circumflex coronary artery: A large vessel giving origin to a small OM1, a moderate limp 2 and cut this is a large OM 3. The proximal segment has a ulcerated 90% stenosis.  Ramus intermediate: It is a moderate to large caliber vessel, with mild luminal irregularity.  LAD: LAD gives origin to a large diagonal-1. The previously placed mid LAD stent is occluded. The D1 has a ostial 30-40% stenosis and proximal mild luminal irregularity. Distal LAD has mild diffuse luminal irregularity.  Impression: I suspect the circumflex coronary artery to be the culprit, however LAD appears to be freshly occluded and there were no collaterals. Hence we'll proceed with intervention to both LAD and circumflex coronary artery.  Interventional data: Successful PTCA and stenting of the proximal LAD with implantation of a 4.0 x 28 mm promos Premier drug-eluting stent. Stenosis reduced from 100% to 0% with improvement in TIMI flow from TIMI 0 to TIMI 3 flow.  Successful PTCA and stenting of the  proximal circumflex coronary artery implantation of a 4.0 x 20 mm promos Premier drug-eluting stent. Stenosis reduced from ulcerated 90% to 0% with improvement in TIMI flow from TIMI 2 to TIMI-3 at the end of the procedure.   Consults:   Hospital Course: Patient is a 62 year old Caucasian male with history of hypertension, hyperlipidemia and history of myocardial infarction in 2007, history of coronary angioplasty, details of which amenable to extract from the medical chart, were been doing well until September 2013, started experiencing heaviness in the middle of the chest similar to his angina pectoris. Over the past one month symptoms are worse and more frequent. He presented with  3 days of rest angina and has taken sublingual nitroglycerin with partial relief of chest pain. He presented to emergency room with chest pain and due to his ongoing chest discomfort he underwent coronary angiography the same day as hospital admission. He was found to have severe 2 vessel coronary artery disease with occluded LAD at the previously stented site in 2007, and a high-grade ulcerated proximal circumflex coronary artery. He underwent successful angioplasty. He did rule in for myocardial infarction. The following morning he was walking with the help of cardiac rehabilitation without any recurrence of chest pain or shortness of breath. He was felt stable for discharge.  Recommendations on discharge: He will need aspirin and BRILINTA for long-term at least for a period of a year. He'll be referred to cardiac rehabilitation.  Discharge Exam: Blood pressure 142/97, pulse 73, temperature 98.5 F (36.9 C), temperature source Oral, resp. rate 20, height 6\' 1"  (1.854 m), weight 112.7 kg (248 lb 7.3 oz), SpO2 97.00%.   General appearance: alert, cooperative, appears  stated age, no distress and mildly obese  Eyes: negative findings: lids and lashes normal  Neck: no adenopathy, no carotid bruit, no JVD, supple,  symmetrical, trachea midline and thyroid not enlarged, symmetric, no tenderness/mass/nodules  Neck: JVP - normal, carotids 2+= without bruits  Resp: clear to auscultation bilaterally  Chest wall: no tenderness  Cardio: S1 and S2 is normal there is a 1-2/6 early systolic murmur in the aortic area. There is no gallop.  GI: soft, non-tender; bowel sounds normal; no masses, no organomegaly  Extremities: extremities normal, atraumatic, no cyanosis or edema  Pulses: 2+ and symmetric, right radial arterial access site without any complication of hematoma with good pulse. Skin: Skin color, texture, turgor normal. No rashes or lesions  Neurologic: Grossly normal   Labs:   Lab Results  Component Value Date   WBC 8.0 04/21/2013   HGB 13.9 04/21/2013   HCT 38.9* 04/21/2013   MCV 95.1 04/21/2013   PLT 179 04/21/2013    Recent Labs Lab 04/21/13 0558  NA 135  K 3.8  CL 104  CO2 22  BUN 12  CREATININE 1.02  CALCIUM 8.5  GLUCOSE 118*    Cardiac Panel (last 3 results)  Recent Labs  04/20/13 1505 04/20/13 1630 04/20/13 2100 04/21/13 0558  CKTOTAL  --  130  --   --   CKMB  --  9.5*  --   --   TROPONINI 0.75*  --  2.52* 4.58*  RELINDX  --  7.3*  --   --    HbA1c 04/20/2013: 5.8%. TSH normal.  EKG: 04/21/2013: Normal sinus rhythm, inferior infarct, old compared to EKG on 04/20/2013, anterior T wave inversion no longer present.   Radiology: Dg Chest Portable 1 View  04/20/2013   CLINICAL DATA:  Chest pain.  EXAM: PORTABLE CHEST - 1 VIEW  COMPARISON:  10/02/2005.  FINDINGS: Trachea is midline. Heart size stable and accentuated by AP technique. Slight accentuation of the pulmonary markings may be technical in etiology. No pleural fluid.  IMPRESSION: Slight accentuation of the pulmonary markings may be technical in etiology. No definite acute findings.   Electronically Signed   By: Leanna Battles M.D.   On: 04/20/2013 11:02    FOLLOW UP PLANS AND APPOINTMENTS Discharge Orders    Future Orders Complete By Expires   Amb Referral to Cardiac Rehabilitation  As directed    Comments:     To [facility]       Medication List    STOP taking these medications       lovastatin 40 MG tablet  Commonly known as:  MEVACOR      TAKE these medications       aspirin EC 81 MG tablet  Take 81 mg by mouth at bedtime.     atorvastatin 40 MG tablet  Commonly known as:  LIPITOR  Take 1 tablet (40 mg total) by mouth daily.     carvedilol 6.25 MG tablet  Commonly known as:  COREG  Take 6.25 mg by mouth 2 (two) times daily with a meal.     nitroGLYCERIN 0.4 MG SL tablet  Commonly known as:  NITROSTAT  Place 1 tablet (0.4 mg total) under the tongue every 5 (five) minutes as needed for chest pain (CP or SOB).     omeprazole 40 MG capsule  Commonly known as:  PRILOSEC  Take 40 mg by mouth daily.     ramipril 5 MG capsule  Commonly known as:  ALTACE  Take 1  capsule (5 mg total) by mouth daily.     sertraline 50 MG tablet  Commonly known as:  ZOLOFT  Take 50 mg by mouth at bedtime.     Ticagrelor 90 MG Tabs tablet  Commonly known as:  BRILINTA  Take 1 tablet (90 mg total) by mouth 2 (two) times daily.           Follow-up Information   Call Pamella Pert, MD. (To be seen in 10 days to 2 weeks)    Specialty:  Cardiology   Contact information:   [address] [place] [address] [phone]      Pamella Pert, MD 04/21/2013, 6:15 PM  Pager: [phone] Office: [phone] If no answer: [phone]

## 2013-04-21 NOTE — Progress Notes (Signed)
CARDIAC REHAB PHASE I   PRE:  Rate/Rhythm: 74 SR    BP: sitting 128/102    SaO2:   MODE:  Ambulation: 1000 ft   POST:  Rate/Rhythm: 91 SR    BP: sitting 150/113, 142/97 15 min later    SaO2:    Pt BP elevated this am. Walked without problems but BP higher after walk. Pt does c/o HA this am, entire time I was with him. Ed completed. Pt c/o one episode of right upper arm aching. Went away after a few minutes. Noted troponin higher this am. Interested in [facility] and will send referral to [facility] [employer] [employer] [facility] where he works. [phone]  Elissa Lovett Mina CES, ACSM 04/21/2013 8:58 AM

## 2014-05-13 ENCOUNTER — Encounter (HOSPITAL_COMMUNITY): Payer: Self-pay | Admitting: Cardiology

## 2014-08-09 ENCOUNTER — Encounter (INDEPENDENT_AMBULATORY_CARE_PROVIDER_SITE_OTHER): Payer: Self-pay | Admitting: *Deleted

## 2014-08-18 ENCOUNTER — Encounter (INDEPENDENT_AMBULATORY_CARE_PROVIDER_SITE_OTHER): Payer: Self-pay | Admitting: *Deleted

## 2014-08-18 ENCOUNTER — Other Ambulatory Visit (INDEPENDENT_AMBULATORY_CARE_PROVIDER_SITE_OTHER): Payer: Self-pay | Admitting: *Deleted

## 2014-08-18 DIAGNOSIS — Z86010 Personal history of colonic polyps: Secondary | ICD-10-CM

## 2014-08-18 DIAGNOSIS — Z1211 Encounter for screening for malignant neoplasm of colon: Secondary | ICD-10-CM

## 2014-08-18 DIAGNOSIS — Z800 Family history of malignant neoplasm of digestive organs: Secondary | ICD-10-CM

## 2014-09-08 ENCOUNTER — Telehealth (INDEPENDENT_AMBULATORY_CARE_PROVIDER_SITE_OTHER): Payer: Self-pay | Admitting: *Deleted

## 2014-09-08 DIAGNOSIS — Z1211 Encounter for screening for malignant neoplasm of colon: Secondary | ICD-10-CM

## 2014-09-08 NOTE — Telephone Encounter (Signed)
Patient needs trilyte 

## 2014-09-09 MED ORDER — PEG 3350-KCL-NA BICARB-NACL 420 G PO SOLR: 4000.0000 mL | Freq: Once | ORAL | Status: DC

## 2014-09-22 ENCOUNTER — Encounter (INDEPENDENT_AMBULATORY_CARE_PROVIDER_SITE_OTHER): Payer: Self-pay | Admitting: *Deleted

## 2014-09-28 ENCOUNTER — Telehealth (INDEPENDENT_AMBULATORY_CARE_PROVIDER_SITE_OTHER): Payer: Self-pay | Admitting: *Deleted

## 2014-09-28 NOTE — Telephone Encounter (Signed)
Referring MD/PCP: hall   Procedure: tcs  Reason/Indication:  Screening, hx polyps, fam hx colon ca  Has patient had this procedure before?  Yes, Devon Sanchez 2012 normal, 2008 normal and 2004 4 adenomatous polyps per patient  If so, when, by whom and where?    Is there a family history of colon cancer?  Yes, uncle  Who?  What age when diagnosed?    Is patient diabetic?   no      Does patient have prosthetic heart valve?  no  Do you have a pacemaker?  no  Has patient ever had endocarditis? no  Has patient had joint replacement within last 12 months?  no  Does patient tend to be constipated or take laxatives? no  Is patient on Coumadin, Plavix and/or Aspirin? yes  Medications: asa 81 mg daily, Crestor 20 mg daily, carvedilol 6.25 mg daily, fish oil, nitro tabs prn, sildenafil 50 mg prn  Allergies: nklda  Medication Adjustment: asa 2 days  Procedure date & time: 10/28/14 at 830

## 2014-09-28 NOTE — Telephone Encounter (Signed)
agree

## 2014-10-11 ENCOUNTER — Ambulatory Visit (HOSPITAL_COMMUNITY)
Admission: RE | Admit: 2014-10-11 | Discharge: 2014-10-11 | Disposition: A | Discharge status: Home / Self Care | Payer: BLUE CROSS/BLUE SHIELD | Source: Ambulatory Visit | Attending: Internal Medicine | Admitting: Internal Medicine

## 2014-10-11 ENCOUNTER — Other Ambulatory Visit (HOSPITAL_COMMUNITY): Payer: Self-pay | Admitting: Internal Medicine

## 2014-10-11 DIAGNOSIS — M25511 Pain in right shoulder: Secondary | ICD-10-CM | POA: Diagnosis not present

## 2014-10-11 DIAGNOSIS — R937 Abnormal findings on diagnostic imaging of other parts of musculoskeletal system: Secondary | ICD-10-CM | POA: Insufficient documentation

## 2014-10-12 ENCOUNTER — Encounter: Payer: Self-pay | Admitting: Orthopedic Surgery

## 2014-10-12 ENCOUNTER — Ambulatory Visit (INDEPENDENT_AMBULATORY_CARE_PROVIDER_SITE_OTHER): Payer: BLUE CROSS/BLUE SHIELD | Admitting: Orthopedic Surgery

## 2014-10-12 VITALS — BP 136/95 | Ht 73.0 in | Wt 243.0 lb

## 2014-10-12 DIAGNOSIS — M25511 Pain in right shoulder: Secondary | ICD-10-CM | POA: Diagnosis not present

## 2014-10-12 DIAGNOSIS — M75102 Unspecified rotator cuff tear or rupture of left shoulder, not specified as traumatic: Secondary | ICD-10-CM

## 2014-10-12 DIAGNOSIS — M25512 Pain in left shoulder: Secondary | ICD-10-CM | POA: Diagnosis not present

## 2014-10-12 DIAGNOSIS — M75101 Unspecified rotator cuff tear or rupture of right shoulder, not specified as traumatic: Secondary | ICD-10-CM | POA: Diagnosis not present

## 2014-10-12 NOTE — Patient Instructions (Signed)
Call [facility] therapy dept to schedule therapy  Joint Injection Care After Refer to this sheet in the next few days. These instructions provide you with information on caring for yourself after you have had a joint injection. Your caregiver also may give you more specific instructions. Your treatment has been planned according to current medical practices, but problems sometimes occur. Call your caregiver if you have any problems or questions after your procedure. After any type of joint injection, it is not uncommon to experience:  Soreness, swelling, or bruising around the injection site.  Mild numbness, tingling, or weakness around the injection site caused by the numbing medicine used before or with the injection. It also is possible to experience the following effects associated with the specific agent after injection:  Iodine-based contrast agents:  Allergic reaction (itching, hives, widespread redness, and swelling beyond the injection site).  Corticosteroids (These effects are rare.):  Allergic reaction.  Increased blood sugar levels (If you have diabetes and you notice that your blood sugar levels have increased, notify your caregiver).  Increased blood pressure levels.  Mood swings.  Hyaluronic acid in the use of viscosupplementation.  Temporary heat or redness.  Temporary rash and itching.  Increased fluid accumulation in the injected joint. These effects all should resolve within a day after your procedure.  HOME CARE INSTRUCTIONS  Limit yourself to light activity the day of your procedure. Avoid lifting heavy objects, bending, stooping, or twisting.  Take prescription or over-the-counter pain medication as directed by your caregiver.  You may apply ice to your injection site to reduce pain and swelling the day of your procedure. Ice may be applied 03-04 times:  Put ice in a plastic bag.  Place a towel between your skin and the bag.  Leave the ice on for no longer  than 15-20 minutes each time. SEEK IMMEDIATE MEDICAL CARE IF:   Pain and swelling get worse rather than better or extend beyond the injection site.  Numbness does not go away.  Blood or fluid continues to leak from the injection site.  You have chest pain.  You have swelling of your face or tongue.  You have trouble breathing or you become dizzy.  You develop a fever, chills, or severe tenderness at the injection site that last longer than 1 day. MAKE SURE YOU:  Understand these instructions.  Watch your condition.  Get help right away if you are not doing well or if you get worse. Document Released: 02/01/2011 Document Revised: 08/13/2011 Document Reviewed: 02/01/2011 [facility] Patient Information 2015 [place], [place]. This information is not intended to replace advice given to you by your health care provider. Make sure you discuss any questions you have with your health care provider.
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
## 2014-10-14 ENCOUNTER — Ambulatory Visit (HOSPITAL_COMMUNITY): Payer: BLUE CROSS/BLUE SHIELD | Attending: Orthopedic Surgery | Admitting: Occupational Therapy

## 2014-10-14 ENCOUNTER — Encounter (HOSPITAL_COMMUNITY): Payer: Self-pay | Admitting: Occupational Therapy

## 2014-10-14 DIAGNOSIS — M25619 Stiffness of unspecified shoulder, not elsewhere classified: Secondary | ICD-10-CM

## 2014-10-14 DIAGNOSIS — M25512 Pain in left shoulder: Secondary | ICD-10-CM | POA: Insufficient documentation

## 2014-10-14 DIAGNOSIS — M25511 Pain in right shoulder: Secondary | ICD-10-CM | POA: Insufficient documentation

## 2014-10-14 DIAGNOSIS — M6289 Other specified disorders of muscle: Secondary | ICD-10-CM | POA: Insufficient documentation

## 2014-10-14 DIAGNOSIS — M6281 Muscle weakness (generalized): Secondary | ICD-10-CM | POA: Insufficient documentation

## 2014-10-14 DIAGNOSIS — M629 Disorder of muscle, unspecified: Secondary | ICD-10-CM

## 2014-10-14 DIAGNOSIS — R531 Weakness: Secondary | ICD-10-CM

## 2014-10-14 NOTE — Patient Instructions (Signed)
SHOULDER: Flexion On Table   Place hands on table, elbows straight. Move hips away from body. Press hands down into table.  __10_ reps per set, _1__ sets per day  Abduction (Passive)   With arm out to side, resting on table, lower head toward arm, keeping trunk away from table.  Repeat __10__ times. Do __2__ sessions per day.  Copyright  VHI. All rights reserved.     Internal Rotation (Assistive)   Seated with elbow bent at right angle and held against side, slide arm on table surface in an inward arc. Repeat __10__ times. Do __2__ sessions per day. Activity: Use this motion to brush crumbs off the table.  Copyright  VHI. All rights reserved.
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
## 2014-10-26 ENCOUNTER — Ambulatory Visit (HOSPITAL_COMMUNITY): Payer: BLUE CROSS/BLUE SHIELD | Admitting: Occupational Therapy

## 2014-10-26 ENCOUNTER — Encounter (HOSPITAL_COMMUNITY): Payer: Self-pay | Admitting: Occupational Therapy

## 2014-10-26 DIAGNOSIS — M629 Disorder of muscle, unspecified: Secondary | ICD-10-CM

## 2014-10-26 DIAGNOSIS — M25511 Pain in right shoulder: Secondary | ICD-10-CM

## 2014-10-26 DIAGNOSIS — R531 Weakness: Secondary | ICD-10-CM

## 2014-10-26 DIAGNOSIS — M25512 Pain in left shoulder: Secondary | ICD-10-CM

## 2014-10-26 DIAGNOSIS — M6289 Other specified disorders of muscle: Secondary | ICD-10-CM

## 2014-10-26 DIAGNOSIS — M25619 Stiffness of unspecified shoulder, not elsewhere classified: Secondary | ICD-10-CM
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
## 2014-10-27 ENCOUNTER — Ambulatory Visit (HOSPITAL_COMMUNITY): Payer: BLUE CROSS/BLUE SHIELD

## 2014-10-27 DIAGNOSIS — M25511 Pain in right shoulder: Secondary | ICD-10-CM | POA: Diagnosis not present

## 2014-10-27 DIAGNOSIS — M629 Disorder of muscle, unspecified: Secondary | ICD-10-CM

## 2014-10-27 DIAGNOSIS — M25512 Pain in left shoulder: Secondary | ICD-10-CM

## 2014-10-27 DIAGNOSIS — M6289 Other specified disorders of muscle: Secondary | ICD-10-CM

## 2014-10-27 DIAGNOSIS — R531 Weakness: Secondary | ICD-10-CM

## 2014-10-27 DIAGNOSIS — M25619 Stiffness of unspecified shoulder, not elsewhere classified: Secondary | ICD-10-CM
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
## 2014-10-27 NOTE — Patient Instructions (Signed)
Complete 12 times. 2-3 times a day.  1) Shoulder Protraction    Begin with elbows by your side, slowly "punch" straight out in front of you keeping arms/elbows straight. Repeat ___times, ____set/day.     2) Shoulder Flexion  Supine:     Standing:         Begin with arms at your side with thumbs pointed up, slowly raise both arms up and forward towards overhead. Repeat___times, ___set/day.      3) Horizontal abduction/adduction  Supine:   Standing:           Begin with arms straight out in front of you, bring out to the side in at "T" shape. Keep arms straight entire time. Repeat ____times, ____sets/day.                 4) Internal & External Rotation    *No band* -Stand with elbows at the side and elbows bent 90 degrees. Move your forearms away from your body, then bring back inward toward the body.  Repeat ___times, ___sets/day    5) Shoulder Abduction  Supine:     Standing:       Lying on your back begin with your arms flat on the table next to your side. Slowly move your arms out to the side so that they go overhead, in a jumping jack or snow angel movement. Repeat ___times, ___sets/day

## 2014-10-28 ENCOUNTER — Encounter (HOSPITAL_COMMUNITY): Payer: Self-pay

## 2014-10-28 ENCOUNTER — Ambulatory Visit (HOSPITAL_COMMUNITY): Payer: BLUE CROSS/BLUE SHIELD

## 2014-10-28 DIAGNOSIS — M629 Disorder of muscle, unspecified: Secondary | ICD-10-CM

## 2014-10-28 DIAGNOSIS — M25511 Pain in right shoulder: Secondary | ICD-10-CM

## 2014-10-28 DIAGNOSIS — R531 Weakness: Secondary | ICD-10-CM

## 2014-10-28 DIAGNOSIS — M25619 Stiffness of unspecified shoulder, not elsewhere classified: Secondary | ICD-10-CM

## 2014-10-28 DIAGNOSIS — M25512 Pain in left shoulder: Secondary | ICD-10-CM

## 2014-10-28 DIAGNOSIS — M6289 Other specified disorders of muscle: Secondary | ICD-10-CM
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
## 2014-11-03 ENCOUNTER — Encounter (HOSPITAL_COMMUNITY): Payer: Self-pay | Admitting: Occupational Therapy

## 2014-11-03 ENCOUNTER — Ambulatory Visit (HOSPITAL_COMMUNITY): Payer: BLUE CROSS/BLUE SHIELD | Attending: Orthopedic Surgery | Admitting: Occupational Therapy

## 2014-11-03 DIAGNOSIS — M25511 Pain in right shoulder: Secondary | ICD-10-CM | POA: Insufficient documentation

## 2014-11-03 DIAGNOSIS — M25512 Pain in left shoulder: Secondary | ICD-10-CM | POA: Diagnosis not present

## 2014-11-03 DIAGNOSIS — M6289 Other specified disorders of muscle: Secondary | ICD-10-CM

## 2014-11-03 DIAGNOSIS — M6281 Muscle weakness (generalized): Secondary | ICD-10-CM | POA: Diagnosis not present

## 2014-11-03 DIAGNOSIS — R531 Weakness: Secondary | ICD-10-CM

## 2014-11-03 DIAGNOSIS — M25619 Stiffness of unspecified shoulder, not elsewhere classified: Secondary | ICD-10-CM

## 2014-11-03 DIAGNOSIS — M629 Disorder of muscle, unspecified: Secondary | ICD-10-CM
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
## 2014-11-04 ENCOUNTER — Ambulatory Visit (HOSPITAL_COMMUNITY)
Admission: RE | Admit: 2014-11-04 | Discharge: 2014-11-04 | Disposition: A | Discharge status: Home / Self Care | Payer: BLUE CROSS/BLUE SHIELD | Source: Ambulatory Visit | Attending: Internal Medicine | Admitting: Internal Medicine

## 2014-11-04 ENCOUNTER — Encounter (HOSPITAL_COMMUNITY): Payer: Self-pay | Admitting: *Deleted

## 2014-11-04 ENCOUNTER — Encounter (HOSPITAL_COMMUNITY)
Admission: RE | Disposition: A | Discharge status: Home / Self Care | Payer: Self-pay | Source: Ambulatory Visit | Attending: Internal Medicine

## 2014-11-04 DIAGNOSIS — F419 Anxiety disorder, unspecified: Secondary | ICD-10-CM | POA: Diagnosis not present

## 2014-11-04 DIAGNOSIS — D123 Benign neoplasm of transverse colon: Secondary | ICD-10-CM | POA: Diagnosis not present

## 2014-11-04 DIAGNOSIS — Z79899 Other long term (current) drug therapy: Secondary | ICD-10-CM | POA: Insufficient documentation

## 2014-11-04 DIAGNOSIS — I252 Old myocardial infarction: Secondary | ICD-10-CM | POA: Diagnosis not present

## 2014-11-04 DIAGNOSIS — K633 Ulcer of intestine: Secondary | ICD-10-CM | POA: Insufficient documentation

## 2014-11-04 DIAGNOSIS — F329 Major depressive disorder, single episode, unspecified: Secondary | ICD-10-CM | POA: Diagnosis not present

## 2014-11-04 DIAGNOSIS — Z955 Presence of coronary angioplasty implant and graft: Secondary | ICD-10-CM | POA: Insufficient documentation

## 2014-11-04 DIAGNOSIS — Z800 Family history of malignant neoplasm of digestive organs: Secondary | ICD-10-CM | POA: Insufficient documentation

## 2014-11-04 DIAGNOSIS — E780 Pure hypercholesterolemia: Secondary | ICD-10-CM | POA: Diagnosis not present

## 2014-11-04 DIAGNOSIS — Z7982 Long term (current) use of aspirin: Secondary | ICD-10-CM | POA: Insufficient documentation

## 2014-11-04 DIAGNOSIS — Z1211 Encounter for screening for malignant neoplasm of colon: Secondary | ICD-10-CM

## 2014-11-04 DIAGNOSIS — Z791 Long term (current) use of non-steroidal anti-inflammatories (NSAID): Secondary | ICD-10-CM | POA: Insufficient documentation

## 2014-11-04 DIAGNOSIS — Z86010 Personal history of colonic polyps: Secondary | ICD-10-CM | POA: Insufficient documentation

## 2014-11-04 DIAGNOSIS — K219 Gastro-esophageal reflux disease without esophagitis: Secondary | ICD-10-CM | POA: Insufficient documentation

## 2014-11-04 DIAGNOSIS — K648 Other hemorrhoids: Secondary | ICD-10-CM | POA: Diagnosis not present

## 2014-11-04 DIAGNOSIS — Z09 Encounter for follow-up examination after completed treatment for conditions other than malignant neoplasm: Secondary | ICD-10-CM | POA: Diagnosis present

## 2014-11-04 HISTORY — PX: COLONOSCOPY: SHX5424

## 2014-11-04 SURGERY — COLONOSCOPY
Anesthesia: Moderate Sedation

## 2014-11-04 MED ORDER — MEPERIDINE HCL 50 MG/ML IJ SOLN
INTRAMUSCULAR | Status: AC
  Filled 2014-11-04: qty 1

## 2014-11-04 MED ORDER — SODIUM CHLORIDE 0.9 % IV SOLN
INTRAVENOUS | Status: DC
  Administered 2014-11-04: 10:00:00 via INTRAVENOUS

## 2014-11-04 MED ORDER — MIDAZOLAM HCL 5 MG/5ML IJ SOLN
INTRAMUSCULAR | Status: AC
  Filled 2014-11-04: qty 10

## 2014-11-04 MED ORDER — SIMETHICONE 40 MG/0.6ML PO SUSP
ORAL | Status: DC | PRN
  Administered 2014-11-04: 10:00:00

## 2014-11-04 MED ORDER — MIDAZOLAM HCL 5 MG/5ML IJ SOLN
INTRAMUSCULAR | Status: DC | PRN
  Administered 2014-11-04: 1 mg via INTRAVENOUS
  Administered 2014-11-04: 3 mg via INTRAVENOUS
  Administered 2014-11-04 (×2): 2 mg via INTRAVENOUS

## 2014-11-04 MED ORDER — MEPERIDINE HCL 50 MG/ML IJ SOLN
INTRAMUSCULAR | Status: DC | PRN
  Administered 2014-11-04 (×2): 25 mg via INTRAVENOUS

## 2014-11-04 NOTE — Discharge Instructions (Signed)
No aspirin or NSAIDs for 1 week. Resume other medications and diet as before. No driving for 24 hours. Physician will call with biopsy results.   Colon Polyps Polyps are lumps of extra tissue growing inside the body. Polyps can grow in the large intestine (colon). Most colon polyps are noncancerous (benign). However, some colon polyps can become cancerous over time. Polyps that are larger than a pea may be harmful. To be safe, caregivers remove and test all polyps. CAUSES  Polyps form when mutations in the genes cause your cells to grow and divide even though no more tissue is needed. RISK FACTORS There are a number of risk factors that can increase your chances of getting colon polyps. They include:  Being older than 50 years.  Family history of colon polyps or colon cancer.  Long-term colon diseases, such as colitis or Crohn disease.  Being overweight.  Smoking.  Being inactive.  Drinking too much alcohol. SYMPTOMS  Most small polyps do not cause symptoms. If symptoms are present, they may include:  Blood in the stool. The stool may look dark red or black.  Constipation or diarrhea that lasts longer than 1 week. DIAGNOSIS People often do not know they have polyps until their caregiver finds them during a regular checkup. Your caregiver can use 4 tests to check for polyps:  Digital rectal exam. The caregiver wears gloves and feels inside the rectum. This test would find polyps only in the rectum.  Barium enema. The caregiver puts a liquid called barium into your rectum before taking X-rays of your colon. Barium makes your colon look white. Polyps are dark, so they are easy to see in the X-ray pictures.  Sigmoidoscopy. A thin, flexible tube (sigmoidoscope) is placed into your rectum. The sigmoidoscope has a light and tiny camera in it. The caregiver uses the sigmoidoscope to look at the last third of your colon.  Colonoscopy. This test is like sigmoidoscopy, but the caregiver  looks at the entire colon. This is the most common method for finding and removing polyps. TREATMENT  Any polyps will be removed during a sigmoidoscopy or colonoscopy. The polyps are then tested for cancer. PREVENTION  To help lower your risk of getting more colon polyps:  Eat plenty of fruits and vegetables. Avoid eating fatty foods.  Do not smoke.  Avoid drinking alcohol.  Exercise every day.  Lose weight if recommended by your caregiver.  Eat plenty of calcium and folate. Foods that are rich in calcium include milk, cheese, and broccoli. Foods that are rich in folate include chickpeas, kidney beans, and spinach. HOME CARE INSTRUCTIONS Keep all follow-up appointments as directed by your caregiver. You may need periodic exams to check for polyps. SEEK MEDICAL CARE IF: You notice bleeding during a bowel movement. Document Released: 02/15/2004 Document Revised: 08/13/2011 Document Reviewed: 07/31/2011 [facility] Patient Information 2015 [place], [place]. This information is not intended to replace advice given to you by your health care provider. Make sure you discuss any questions you have with your health care provider. Colonoscopy, Care After These instructions give you information on caring for yourself after your procedure. Your doctor may also give you more specific instructions. Call your doctor if you have any problems or questions after your procedure. HOME CARE  Do not drive for 24 hours.  Do not sign important papers or use machinery for 24 hours.  You may shower.  You may go back to your usual activities, but go slower for the first 24 hours.  Take rest breaks often during the first 24 hours.  Walk around or use warm packs on your belly (abdomen) if you have belly cramping or gas.  Drink enough fluids to keep your pee (urine) clear or pale yellow.  Resume your normal diet. Avoid heavy or fried foods.  Avoid drinking alcohol for 24 hours or as told by your  doctor.  Only take medicines as told by your doctor. If a tissue sample (biopsy) was taken during the procedure:   Do not take aspirin or blood thinners for 7 days, or as told by your doctor.  Do not drink alcohol for 7 days, or as told by your doctor.  Eat soft foods for the first 24 hours. GET HELP IF: You still have a small amount of blood in your poop (stool) 2-3 days after the procedure. GET HELP RIGHT AWAY IF:  You have more than a small amount of blood in your poop.  You see clumps of tissue (blood clots) in your poop.  Your belly is puffy (swollen).  You feel sick to your stomach (nauseous) or throw up (vomit).  You have a fever.  You have belly pain that gets worse and medicine does not help. MAKE SURE YOU:  Understand these instructions.  Will watch your condition.  Will get help right away if you are not doing well or get worse. Document Released: 06/23/2010 Document Revised: 05/26/2013 Document Reviewed: 01/26/2013 [facility] Patient Information 2015 [place], [place]. This information is not intended to replace advice given to you by your health care provider. Make sure you discuss any questions you have with your health care provider.

## 2014-11-04 NOTE — H&P (Signed)
Devon Sanchez is an 64 y.o. male.   Chief Complaint: Patient is here for colonoscopy. HPI: Patient is 64 year old Caucasian male with history of colonic adenomas and is here for surveillance colonoscopy. He denies abdominal pain diarrhea constipation. He has occasional hematochezia feet 2 many NSAIDs. He has good appetite andbeen stable. Last colonoscopy was in 2012 and no polyps were found. This exam was in [facility]. Family history significant for colon carcinoma in maternal uncle and maternal aunt. Maternal uncle was in his 69s at the time of diagnosis.  Past Medical History  Diagnosis Date  . MI (myocardial infarction)   . Hypercholesterolemia   . Reflux   . Depression   . Anxiety     Past Surgical History  Procedure Laterality Date  . Coronary angioplasty with stent placement    . Left heart catheterization with coronary angiogram N/A 04/20/2013    Procedure: LEFT HEART CATHETERIZATION WITH CORONARY ANGIOGRAM;  Surgeon: Laverda Page, MD;  Location: [facility] CATH LAB;  Service: Cardiovascular;  Laterality: N/A;    History reviewed. No pertinent family history. Social History:  reports that he has never smoked. He does not have any smokeless tobacco history on file. He reports that he does not drink alcohol. His drug history is not on file.  Allergies: No Known Allergies  Medications Prior to Admission  Medication Sig Dispense Refill  . carvedilol (COREG) 6.25 MG tablet Take 6.25 mg by mouth 2 (two) times daily with a meal.    . escitalopram (LEXAPRO) 10 MG tablet Take 5 mg by mouth daily.     . Ibuprofen 200 MG CAPS Take by mouth.    . rosuvastatin (CRESTOR) 20 MG tablet Take 20 mg by mouth daily.    . sildenafil (REVATIO) 20 MG tablet Take 20 mg by mouth daily as needed (erectile dysfunction).     Marland Kitchen aspirin EC 81 MG tablet Take 81 mg by mouth 2 (two) times daily.     . nitroGLYCERIN (NITROSTAT) 0.4 MG SL tablet Place 1 tablet (0.4 mg total) under the tongue every  5 (five) minutes as needed for chest pain (CP or SOB). 30 tablet 12    No results found for this or any previous visit (from the past 48 hour(s)). No results found.  ROS  Blood pressure 117/84, pulse 69, temperature 97.4 F (36.3 C), temperature source Oral, resp. rate 18, SpO2 96 %. Physical Exam  Constitutional: He appears well-developed and well-nourished.  HENT:  Mouth/Throat: Oropharynx is clear and moist.  Eyes: Conjunctivae are normal. No scleral icterus.  Neck: No thyromegaly present.  Cardiovascular: Normal rate, regular rhythm and normal heart sounds.   No murmur heard. Respiratory: Effort normal and breath sounds normal.  GI: Soft. He exhibits no distension and no mass. There is no tenderness.  Musculoskeletal: He exhibits no edema.  Lymphadenopathy:    He has no cervical adenopathy.  Neurological: He is alert.  Skin: Skin is warm and dry.     Assessment/Plan History of colonic adenomas. Family history of colon carcinoma in 2 second-degree relatives. Surveillance colonoscopy.  Galia Rahm U 11/04/2014, 10:27 AM

## 2014-11-04 NOTE — Op Note (Signed)
COLONOSCOPY PROCEDURE REPORT  PATIENT:  Devon Sanchez  MR#:  [id number] Birthdate:  [birth year], 64 y.o., male Endoscopist:  Dr. Rogene Houston, MD Referred By:  Dr. Delphina Cahill, MD Procedure Date: 11/04/2014  Procedure:   Colonoscopy with snare polypectomy  Indications:  Patient is 64 year old Caucasian male was history of colonic adenomas and family history of CRC in 2 second-degree relatives. Last colonoscopy was in 2012 and no polyps were found. He had 4 polyps on his first exam but 16 years ago.  Informed Consent:  The procedure and risks were reviewed with the patient and informed consent was obtained.  Medications:  Demerol 50 mg IV Versed 8 mg IV  Description of procedure:  After a digital rectal exam was performed, that colonoscope was advanced from the anus through the rectum and colon to the area of the cecum, ileocecal valve and appendiceal orifice. The cecum was deeply intubated. These structures were well-seen and photographed for the record. From the level of the cecum and ileocecal valve, the scope was slowly and cautiously withdrawn. The mucosal surfaces were carefully surveyed utilizing scope tip to flexion to facilitate fold flattening as needed. The scope was pulled down into the rectum where a thorough exam including retroflexion was performed.  Findings:   Prep fair to satisfactory. Redundant colon. Patient had to be turned on his right side in order to advance the scope from hepatic flexure to cecum. 1 cm ulcer noted at hepatic flexure. Biopsies taken. Two  polyps hot snare from hepatic flexure and submitted together. One polyp was about 6 mm and the other one was 5 mm.     Therapeutic/Diagnostic Maneuvers Performed:  See above  Complications:  None  Cecal Withdrawal Time:  29 minutes  Impression:  Examination performed to cecum. Redundant colon. 1 cm ulcer noted at hepatic flexure. Biopsy taken. Suspect NSAID induced ulcer. Two polyps hot snared from  hepatic flexure and submitted together. These are 6 and 5 mm in diameter.  Recommendations:  Standard instructions given. No aspirin or NSAIDs for 1 week. I will contact patient with biopsy results and further recommendations.  Bryley Chrisman U  11/04/2014 11:36 AM  CC: Dr. Delphina Cahill, MD & Dr. Rayne Du ref. provider found

## 2014-11-05 ENCOUNTER — Encounter (HOSPITAL_COMMUNITY): Payer: Self-pay | Admitting: Internal Medicine

## 2014-11-08 ENCOUNTER — Ambulatory Visit (HOSPITAL_COMMUNITY): Payer: BLUE CROSS/BLUE SHIELD

## 2014-11-08 ENCOUNTER — Encounter (HOSPITAL_COMMUNITY): Payer: Self-pay

## 2014-11-08 DIAGNOSIS — M629 Disorder of muscle, unspecified: Secondary | ICD-10-CM

## 2014-11-08 DIAGNOSIS — M25511 Pain in right shoulder: Secondary | ICD-10-CM | POA: Diagnosis not present

## 2014-11-08 DIAGNOSIS — M25611 Stiffness of right shoulder, not elsewhere classified: Secondary | ICD-10-CM

## 2014-11-08 DIAGNOSIS — M6289 Other specified disorders of muscle: Secondary | ICD-10-CM

## 2014-11-08 DIAGNOSIS — M25612 Stiffness of left shoulder, not elsewhere classified: Secondary | ICD-10-CM

## 2014-11-08 DIAGNOSIS — R531 Weakness: Secondary | ICD-10-CM
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
## 2014-11-09 ENCOUNTER — Encounter (INDEPENDENT_AMBULATORY_CARE_PROVIDER_SITE_OTHER): Payer: Self-pay | Admitting: *Deleted

## 2014-11-10 ENCOUNTER — Ambulatory Visit (HOSPITAL_COMMUNITY): Payer: BLUE CROSS/BLUE SHIELD | Attending: Orthopedic Surgery

## 2014-11-10 ENCOUNTER — Encounter (HOSPITAL_COMMUNITY): Payer: Self-pay

## 2014-11-10 DIAGNOSIS — M25611 Stiffness of right shoulder, not elsewhere classified: Secondary | ICD-10-CM | POA: Diagnosis not present

## 2014-11-10 DIAGNOSIS — M25612 Stiffness of left shoulder, not elsewhere classified: Secondary | ICD-10-CM | POA: Diagnosis not present

## 2014-11-10 DIAGNOSIS — M629 Disorder of muscle, unspecified: Secondary | ICD-10-CM

## 2014-11-10 DIAGNOSIS — M25512 Pain in left shoulder: Secondary | ICD-10-CM | POA: Diagnosis not present

## 2014-11-10 DIAGNOSIS — M6289 Other specified disorders of muscle: Secondary | ICD-10-CM

## 2014-11-10 DIAGNOSIS — R531 Weakness: Secondary | ICD-10-CM

## 2014-11-10 DIAGNOSIS — M25511 Pain in right shoulder: Secondary | ICD-10-CM

## 2014-11-10 NOTE — Patient Instructions (Signed)
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
## 2014-11-12 ENCOUNTER — Ambulatory Visit (HOSPITAL_COMMUNITY): Payer: BLUE CROSS/BLUE SHIELD

## 2014-11-12 ENCOUNTER — Encounter (HOSPITAL_COMMUNITY): Payer: Self-pay

## 2014-11-12 DIAGNOSIS — M25512 Pain in left shoulder: Secondary | ICD-10-CM | POA: Diagnosis not present

## 2014-11-12 DIAGNOSIS — M25612 Stiffness of left shoulder, not elsewhere classified: Secondary | ICD-10-CM

## 2014-11-12 DIAGNOSIS — M629 Disorder of muscle, unspecified: Secondary | ICD-10-CM

## 2014-11-12 DIAGNOSIS — M6289 Other specified disorders of muscle: Secondary | ICD-10-CM

## 2014-11-12 DIAGNOSIS — M25611 Stiffness of right shoulder, not elsewhere classified: Secondary | ICD-10-CM

## 2014-11-12 DIAGNOSIS — M25511 Pain in right shoulder: Secondary | ICD-10-CM

## 2014-11-12 DIAGNOSIS — R531 Weakness: Secondary | ICD-10-CM
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
## 2014-11-12 NOTE — Patient Instructions (Addendum)
Impingement Syndrome, Rotator Cuff, Bursitis with Rehab Impingement syndrome is a condition that involves inflammation of the tendons of the rotator cuff and the subacromial bursa, that causes pain in the shoulder. The rotator cuff consists of four tendons and muscles that control much of the shoulder and upper arm function. The subacromial bursa is a fluid filled sac that helps reduce friction between the rotator cuff and one of the bones of the shoulder (acromion). Impingement syndrome is usually an overuse injury that causes swelling of the bursa (bursitis), swelling of the tendon (tendonitis), and/or a tear of the tendon (strain). Strains are classified into three categories. Grade 1 strains cause pain, but the tendon is not lengthened. Grade 2 strains include a lengthened ligament, due to the ligament being stretched or partially ruptured. With grade 2 strains there is still function, although the function may be decreased. Grade 3 strains include a complete tear of the tendon or muscle, and function is usually impaired. SYMPTOMS   Pain around the shoulder, often at the outer portion of the upper arm.  Pain that gets worse with shoulder function, especially when reaching overhead or lifting.  Sometimes, aching when not using the arm.  Pain that wakes you up at night.  Sometimes, tenderness, swelling, warmth, or redness over the affected area.  Loss of strength.  Limited motion of the shoulder, especially reaching behind the back (to the back pocket or to unhook bra) or across your body.  Crackling sound (crepitation) when moving the arm.  Biceps tendon pain and inflammation (in the front of the shoulder). Worse when bending the elbow or lifting. CAUSES  Impingement syndrome is often an overuse injury, in which chronic (repetitive) motions cause the tendons or bursa to become inflamed. A strain occurs when a force is paced on the tendon or muscle that is greater than it can withstand.  Common mechanisms of injury include: Stress from sudden increase in duration, frequency, or intensity of training.  Direct hit (trauma) to the shoulder.  Aging, erosion of the tendon with normal use.  Bony bump on shoulder (acromial spur). RISK INCREASES WITH:  Contact sports (football, wrestling, boxing).  Throwing sports (baseball, tennis, volleyball).  Weightlifting and bodybuilding.  Heavy labor.  Previous injury to the rotator cuff, including impingement.  Poor shoulder strength and flexibility.  Failure to warm up properly before activity.  Inadequate protective equipment.  Old age.  Bony bump on shoulder (acromial spur). PREVENTION   Warm up and stretch properly before activity.  Allow for adequate recovery between workouts.  Maintain physical fitness:  Strength, flexibility, and endurance.  Cardiovascular fitness.  Learn and use proper exercise technique. PROGNOSIS  If treated properly, impingement syndrome usually goes away within 6 weeks. Sometimes surgery is required.  RELATED COMPLICATIONS   Longer healing time if not properly treated, or if not given enough time to heal.  Recurring symptoms, that result in a chronic condition.  Shoulder stiffness, frozen shoulder, or loss of motion.  Rotator cuff tendon tear.  Recurring symptoms, especially if activity is resumed too soon, with overuse, with a direct blow, or when using poor technique. TREATMENT  Treatment first involves the use of ice and medicine, to reduce pain and inflammation. The use of strengthening and stretching exercises may help reduce pain with activity. These exercises may be performed at home or with a therapist. If non-surgical treatment is unsuccessful after more than 6 months, surgery may be advised. After surgery and rehabilitation, activity is usually possible in 3 months.  MEDICATION °· If pain medicine is needed, nonsteroidal anti-inflammatory medicines (aspirin and  ibuprofen), or other minor pain relievers (acetaminophen), are often advised. °· Do not take pain medicine for 7 days before surgery. °· Prescription pain relievers may be given, if your caregiver thinks they are needed. Use only as directed and only as much as you need. °· Corticosteroid injections may be given by your caregiver. These injections should be reserved for the most serious cases, because they may only be given a certain number of times. °HEAT AND COLD °· Cold treatment (icing) should be applied for 10 to 15 minutes every 2 to 3 hours for inflammation and pain, and immediately after activity that aggravates your symptoms. Use ice packs or an ice massage. °· Heat treatment may be used before performing stretching and strengthening activities prescribed by your caregiver, physical therapist, or athletic trainer. Use a heat pack or a warm water soak. °SEEK MEDICAL CARE IF:  °· Symptoms get worse or do not improve in 4 to 6 weeks, despite treatment. °· New, unexplained symptoms develop. (Drugs used in treatment may produce side effects.) ° °

## 2014-11-15 ENCOUNTER — Encounter (HOSPITAL_COMMUNITY): Payer: Self-pay

## 2014-11-15 ENCOUNTER — Ambulatory Visit (HOSPITAL_COMMUNITY): Payer: BLUE CROSS/BLUE SHIELD

## 2014-11-15 DIAGNOSIS — M25511 Pain in right shoulder: Secondary | ICD-10-CM

## 2014-11-15 DIAGNOSIS — M25611 Stiffness of right shoulder, not elsewhere classified: Secondary | ICD-10-CM

## 2014-11-15 DIAGNOSIS — M6289 Other specified disorders of muscle: Secondary | ICD-10-CM

## 2014-11-15 DIAGNOSIS — M629 Disorder of muscle, unspecified: Secondary | ICD-10-CM

## 2014-11-15 DIAGNOSIS — M25512 Pain in left shoulder: Secondary | ICD-10-CM | POA: Diagnosis not present

## 2014-11-15 DIAGNOSIS — M25612 Stiffness of left shoulder, not elsewhere classified: Secondary | ICD-10-CM

## 2014-11-15 DIAGNOSIS — R531 Weakness: Secondary | ICD-10-CM
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
## 2014-11-17 ENCOUNTER — Ambulatory Visit (HOSPITAL_COMMUNITY): Payer: BLUE CROSS/BLUE SHIELD | Admitting: Occupational Therapy

## 2014-11-17 ENCOUNTER — Encounter (HOSPITAL_COMMUNITY): Payer: Self-pay | Admitting: Occupational Therapy

## 2014-11-17 DIAGNOSIS — R531 Weakness: Secondary | ICD-10-CM

## 2014-11-17 DIAGNOSIS — M25619 Stiffness of unspecified shoulder, not elsewhere classified: Secondary | ICD-10-CM

## 2014-11-17 DIAGNOSIS — M629 Disorder of muscle, unspecified: Secondary | ICD-10-CM

## 2014-11-17 DIAGNOSIS — M25511 Pain in right shoulder: Secondary | ICD-10-CM

## 2014-11-17 DIAGNOSIS — M25611 Stiffness of right shoulder, not elsewhere classified: Secondary | ICD-10-CM

## 2014-11-17 DIAGNOSIS — M25612 Stiffness of left shoulder, not elsewhere classified: Secondary | ICD-10-CM

## 2014-11-17 DIAGNOSIS — M25512 Pain in left shoulder: Secondary | ICD-10-CM

## 2014-11-17 DIAGNOSIS — M6289 Other specified disorders of muscle: Secondary | ICD-10-CM
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
## 2014-11-18 ENCOUNTER — Encounter (HOSPITAL_COMMUNITY): Payer: Self-pay

## 2014-11-18 ENCOUNTER — Ambulatory Visit (HOSPITAL_COMMUNITY): Payer: BLUE CROSS/BLUE SHIELD

## 2014-11-18 DIAGNOSIS — M6289 Other specified disorders of muscle: Secondary | ICD-10-CM

## 2014-11-18 DIAGNOSIS — M629 Disorder of muscle, unspecified: Secondary | ICD-10-CM

## 2014-11-18 DIAGNOSIS — M25511 Pain in right shoulder: Secondary | ICD-10-CM | POA: Diagnosis not present

## 2014-11-18 DIAGNOSIS — R531 Weakness: Secondary | ICD-10-CM

## 2014-11-18 DIAGNOSIS — M25611 Stiffness of right shoulder, not elsewhere classified: Secondary | ICD-10-CM

## 2014-11-18 DIAGNOSIS — M25612 Stiffness of left shoulder, not elsewhere classified: Secondary | ICD-10-CM
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
## 2014-11-22 ENCOUNTER — Ambulatory Visit (HOSPITAL_COMMUNITY): Payer: BLUE CROSS/BLUE SHIELD

## 2014-11-22 DIAGNOSIS — M25511 Pain in right shoulder: Secondary | ICD-10-CM

## 2014-11-22 DIAGNOSIS — M629 Disorder of muscle, unspecified: Secondary | ICD-10-CM

## 2014-11-22 DIAGNOSIS — R531 Weakness: Secondary | ICD-10-CM

## 2014-11-22 DIAGNOSIS — M25612 Stiffness of left shoulder, not elsewhere classified: Secondary | ICD-10-CM

## 2014-11-22 DIAGNOSIS — M6289 Other specified disorders of muscle: Secondary | ICD-10-CM

## 2014-11-22 DIAGNOSIS — M25611 Stiffness of right shoulder, not elsewhere classified: Secondary | ICD-10-CM
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
## 2014-11-24 ENCOUNTER — Ambulatory Visit (HOSPITAL_COMMUNITY): Payer: BLUE CROSS/BLUE SHIELD | Admitting: Occupational Therapy

## 2014-11-24 ENCOUNTER — Encounter (HOSPITAL_COMMUNITY): Payer: Self-pay | Admitting: Occupational Therapy

## 2014-11-24 DIAGNOSIS — M25611 Stiffness of right shoulder, not elsewhere classified: Secondary | ICD-10-CM

## 2014-11-24 DIAGNOSIS — M25619 Stiffness of unspecified shoulder, not elsewhere classified: Secondary | ICD-10-CM

## 2014-11-24 DIAGNOSIS — M25511 Pain in right shoulder: Secondary | ICD-10-CM

## 2014-11-24 DIAGNOSIS — M6289 Other specified disorders of muscle: Secondary | ICD-10-CM

## 2014-11-24 DIAGNOSIS — M25512 Pain in left shoulder: Secondary | ICD-10-CM

## 2014-11-24 DIAGNOSIS — M629 Disorder of muscle, unspecified: Secondary | ICD-10-CM

## 2014-11-24 DIAGNOSIS — R531 Weakness: Secondary | ICD-10-CM

## 2014-11-24 DIAGNOSIS — M25612 Stiffness of left shoulder, not elsewhere classified: Secondary | ICD-10-CM
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
## 2014-11-25 ENCOUNTER — Encounter (HOSPITAL_COMMUNITY): Payer: Self-pay

## 2014-11-25 ENCOUNTER — Ambulatory Visit (HOSPITAL_COMMUNITY): Payer: BLUE CROSS/BLUE SHIELD

## 2014-11-25 DIAGNOSIS — M25611 Stiffness of right shoulder, not elsewhere classified: Secondary | ICD-10-CM

## 2014-11-25 DIAGNOSIS — M629 Disorder of muscle, unspecified: Secondary | ICD-10-CM

## 2014-11-25 DIAGNOSIS — M25511 Pain in right shoulder: Secondary | ICD-10-CM | POA: Diagnosis not present

## 2014-11-25 DIAGNOSIS — M6289 Other specified disorders of muscle: Secondary | ICD-10-CM

## 2014-11-25 DIAGNOSIS — R531 Weakness: Secondary | ICD-10-CM

## 2014-11-25 DIAGNOSIS — M25612 Stiffness of left shoulder, not elsewhere classified: Secondary | ICD-10-CM
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
## 2014-11-29 ENCOUNTER — Encounter (HOSPITAL_COMMUNITY): Payer: Self-pay

## 2014-11-29 ENCOUNTER — Ambulatory Visit (HOSPITAL_COMMUNITY): Payer: BLUE CROSS/BLUE SHIELD

## 2014-11-29 DIAGNOSIS — M25611 Stiffness of right shoulder, not elsewhere classified: Secondary | ICD-10-CM

## 2014-11-29 DIAGNOSIS — M25511 Pain in right shoulder: Secondary | ICD-10-CM | POA: Diagnosis not present

## 2014-11-29 DIAGNOSIS — R531 Weakness: Secondary | ICD-10-CM

## 2014-11-29 DIAGNOSIS — M6289 Other specified disorders of muscle: Secondary | ICD-10-CM

## 2014-11-29 DIAGNOSIS — M25612 Stiffness of left shoulder, not elsewhere classified: Secondary | ICD-10-CM

## 2014-11-29 DIAGNOSIS — M629 Disorder of muscle, unspecified: Secondary | ICD-10-CM

## 2014-11-29 NOTE — Patient Instructions (Signed)
Closed Chain: Shoulder Flexion / Extension - on Wall   Hands on wall, step backward. Return. Stepping causes shoulder flexion and extension. Hold stretch for 10 seconds. Complete 3 times. .  http://ss.exer.us/264   Copyright  VHI. All rights reserved.   Closed Chain: Shoulder Abduction / Adduction - on Wall   One hand on wall, step to side and return. Step towards wall and hold stretch for 10 seconds. Complete 3 times.  http://ss.exer.us/266   Copyright  VHI. All rights reserved.   ROM: Towel Stretch - with Interior Rotation   Pull left arm up behind back by pulling towel up with other arm. Hold __10__ seconds. Repeat __3__ times per set. Do __1__ sets per session. Do _2___ sessions per day.  http://orth.exer.us/888   Copyright  VHI. All rights reserved.
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
## 2014-11-30 ENCOUNTER — Encounter: Payer: Self-pay | Admitting: Orthopedic Surgery

## 2014-11-30 ENCOUNTER — Ambulatory Visit (INDEPENDENT_AMBULATORY_CARE_PROVIDER_SITE_OTHER): Payer: BLUE CROSS/BLUE SHIELD | Admitting: Orthopedic Surgery

## 2014-11-30 VITALS — BP 114/77 | Ht 73.0 in | Wt 237.0 lb

## 2014-11-30 DIAGNOSIS — M75101 Unspecified rotator cuff tear or rupture of right shoulder, not specified as traumatic: Secondary | ICD-10-CM | POA: Diagnosis not present

## 2014-11-30 DIAGNOSIS — M25512 Pain in left shoulder: Secondary | ICD-10-CM | POA: Diagnosis not present

## 2014-11-30 DIAGNOSIS — M25511 Pain in right shoulder: Secondary | ICD-10-CM

## 2014-11-30 DIAGNOSIS — M75102 Unspecified rotator cuff tear or rupture of left shoulder, not specified as traumatic: Secondary | ICD-10-CM
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
## 2014-12-01 ENCOUNTER — Ambulatory Visit (HOSPITAL_COMMUNITY): Payer: BLUE CROSS/BLUE SHIELD | Admitting: Occupational Therapy

## 2014-12-02 ENCOUNTER — Encounter (HOSPITAL_COMMUNITY): Payer: BLUE CROSS/BLUE SHIELD

## 2014-12-03 ENCOUNTER — Ambulatory Visit (HOSPITAL_COMMUNITY): Payer: No Typology Code available for payment source | Attending: Orthopedic Surgery

## 2014-12-03 ENCOUNTER — Encounter (HOSPITAL_COMMUNITY): Payer: Self-pay

## 2014-12-03 DIAGNOSIS — M25512 Pain in left shoulder: Secondary | ICD-10-CM | POA: Diagnosis present

## 2014-12-03 DIAGNOSIS — M25511 Pain in right shoulder: Secondary | ICD-10-CM | POA: Insufficient documentation

## 2014-12-03 DIAGNOSIS — M7580 Other shoulder lesions, unspecified shoulder: Secondary | ICD-10-CM | POA: Insufficient documentation

## 2014-12-03 DIAGNOSIS — M629 Disorder of muscle, unspecified: Secondary | ICD-10-CM | POA: Insufficient documentation

## 2014-12-03 DIAGNOSIS — M6289 Other specified disorders of muscle: Secondary | ICD-10-CM

## 2014-12-03 DIAGNOSIS — M25611 Stiffness of right shoulder, not elsewhere classified: Secondary | ICD-10-CM | POA: Diagnosis present

## 2014-12-03 DIAGNOSIS — M6281 Muscle weakness (generalized): Secondary | ICD-10-CM | POA: Diagnosis not present

## 2014-12-03 DIAGNOSIS — R531 Weakness: Secondary | ICD-10-CM

## 2014-12-03 DIAGNOSIS — M25612 Stiffness of left shoulder, not elsewhere classified: Secondary | ICD-10-CM | POA: Insufficient documentation
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
## 2014-12-07 ENCOUNTER — Encounter (HOSPITAL_COMMUNITY): Payer: Self-pay

## 2014-12-07 ENCOUNTER — Ambulatory Visit (HOSPITAL_COMMUNITY): Payer: No Typology Code available for payment source

## 2014-12-07 DIAGNOSIS — M6281 Muscle weakness (generalized): Secondary | ICD-10-CM | POA: Diagnosis not present

## 2014-12-07 DIAGNOSIS — R531 Weakness: Secondary | ICD-10-CM

## 2014-12-07 DIAGNOSIS — M25611 Stiffness of right shoulder, not elsewhere classified: Secondary | ICD-10-CM

## 2014-12-07 DIAGNOSIS — M629 Disorder of muscle, unspecified: Secondary | ICD-10-CM

## 2014-12-07 DIAGNOSIS — M6289 Other specified disorders of muscle: Secondary | ICD-10-CM

## 2014-12-07 DIAGNOSIS — M25612 Stiffness of left shoulder, not elsewhere classified: Secondary | ICD-10-CM
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
## 2014-12-08 ENCOUNTER — Ambulatory Visit (HOSPITAL_COMMUNITY): Payer: No Typology Code available for payment source

## 2014-12-08 DIAGNOSIS — M6281 Muscle weakness (generalized): Secondary | ICD-10-CM | POA: Diagnosis not present

## 2014-12-08 DIAGNOSIS — M6289 Other specified disorders of muscle: Secondary | ICD-10-CM

## 2014-12-08 DIAGNOSIS — M629 Disorder of muscle, unspecified: Secondary | ICD-10-CM

## 2014-12-08 DIAGNOSIS — M25611 Stiffness of right shoulder, not elsewhere classified: Secondary | ICD-10-CM

## 2014-12-08 DIAGNOSIS — M25612 Stiffness of left shoulder, not elsewhere classified: Secondary | ICD-10-CM

## 2014-12-08 DIAGNOSIS — R531 Weakness: Secondary | ICD-10-CM
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
## 2014-12-13 ENCOUNTER — Ambulatory Visit (HOSPITAL_COMMUNITY): Payer: No Typology Code available for payment source

## 2014-12-13 DIAGNOSIS — M6281 Muscle weakness (generalized): Secondary | ICD-10-CM | POA: Diagnosis not present

## 2014-12-13 DIAGNOSIS — M25611 Stiffness of right shoulder, not elsewhere classified: Secondary | ICD-10-CM

## 2014-12-13 DIAGNOSIS — R531 Weakness: Secondary | ICD-10-CM

## 2014-12-13 DIAGNOSIS — M6289 Other specified disorders of muscle: Secondary | ICD-10-CM

## 2014-12-13 DIAGNOSIS — M25612 Stiffness of left shoulder, not elsewhere classified: Secondary | ICD-10-CM

## 2014-12-13 DIAGNOSIS — M629 Disorder of muscle, unspecified: Secondary | ICD-10-CM

## 2014-12-13 NOTE — Patient Instructions (Addendum)
Face Down Field Goal  (1 set 15 reps) Lay face down on a table with your head and shoulders over the edge. Let your arms hang to the ground in a relaxed position. Squeeze your shoulder blades together while keeping your arms straight. Bend your elbows 90 degrees and externally rotate them as far as you comfortably can. Then reverse order      Lying Arm Raises  (1 set 15 reps)       Scapular Raise (1 set 15 reps)     Standing External Rotation (1 set 15 reps)    Behind-the-back (1 set 15 reps) Stand with your palm facing forwards holding resistance tubing. Bend elbow and reach behind your back trying to touch the opposite shoulder blade.

## 2014-12-13 NOTE — Therapy (Signed)
[place], [place], [address] Phone: [phone]   Fax:  [phone]  Occupational Therapy Treatment  Patient Details  Name: Devon Sanchez MRN: [id number] Date of Birth: 02/19/1951 Referring Provider:  Carole Civil, MD  Encounter Date: 12/13/2014      OT End of Session - 12/13/14 1635    Visit Number 18   Number of Visits 21   Date for OT Re-Evaluation 01/10/15   Authorization Type BCBS   OT Start Time 1520   OT Stop Time 1615   OT Time Calculation (min) 55 min   Activity Tolerance Patient tolerated treatment well   Behavior During Therapy [facility] for tasks assessed/performed      Past Medical History  Diagnosis Date  . MI (myocardial infarction)   . Hypercholesterolemia   . Reflux   . Depression   . Anxiety     Past Surgical History  Procedure Laterality Date  . Coronary angioplasty with stent placement    . Left heart catheterization with coronary angiogram N/A 04/20/2013    Procedure: LEFT HEART CATHETERIZATION WITH CORONARY ANGIOGRAM;  Surgeon: Laverda Page, MD;  Location: [facility] CATH LAB;  Service: Cardiovascular;  Laterality: N/A;  . Colonoscopy N/A 11/04/2014    Procedure: COLONOSCOPY;  Surgeon: Rogene Houston, MD;  [facility]O [facility];  Service: Endoscopy;  Laterality: N/A;  830 - moved to 6/2 @ 10:30    There were no vitals filed for this visit.  Visit Diagnosis:  Decreased strength - Plan: Ot plan of care cert/re-cert  Tight fascia - Plan: Ot plan of care cert/re-cert  Shoulder joint stiffness, right - Plan: Ot plan of care cert/re-cert  Shoulder joint stiffness, left - Plan: Ot plan of care cert/re-cert      Subjective Assessment - 12/13/14 1527    Subjective  S: I had pain last night when I was trying to sleep. I just can't get comfortable.    Special Tests FOTO score: 69/100    Currently in Pain? No/denies                      OT Treatments/Exercises  (OP) - 12/13/14 1632    Exercises   Exercises Shoulder   Shoulder Exercises: Supine   Protraction PROM;5 reps   Horizontal ABduction PROM;5 reps   External Rotation PROM;5 reps   Internal Rotation PROM;5 reps   Flexion PROM;5 reps   ABduction PROM;5 reps   Shoulder Exercises: Prone   Other Prone Exercises Hughston exercises; 5 positions; 15X with 3#   Other Prone Exercises Prone field goal; 15X; 3#   Shoulder Exercises: Standing   External Rotation Theraband;15 reps   Theraband Level (Shoulder External Rotation) Level 3 (Green)   Internal Rotation Theraband;15 reps;Limitations  behind back   Theraband Level (Shoulder Internal Rotation) Level 1 (Yellow)   Internal Rotation Limitations Limited range of motion   Manual Therapy   Manual Therapy Myofascial release;Muscle Energy Technique   Myofascial Release Myofascial release to right  upper arm, trapezius, and scapularis regions to decrease pain and fascial restrictions and increase joint mobility    Muscle Energy Technique Muscle energy technique to bilateral anterior deltoid to relax tone and muscle spasm and improve range of motion.                 OT Education - 12/13/14 1093    Education provided Yes   Education Details Shoulder strengthening HEP   Person(s)  Educated Patient   Methods Explanation;Demonstration;Handout   Comprehension Returned demonstration;Verbalized understanding          OT Short Term Goals - 11/12/14 1605    OT SHORT TERM GOAL #1   Title Pt will be educated on HEP    Time 6   Period Weeks   Status On-going   OT SHORT TERM GOAL #2   Title Pt will decrease pain to 3/10 or less during daily tasks.    Time 6   Period Weeks   Status On-going   OT SHORT TERM GOAL #3   Title Pt will decrease BUE fascial restrictions to min amounts or less.    Time 6   Period Weeks   Status Partially Met   OT SHORT TERM GOAL #4   Title Pt will increase BUE AROM to WNL to increase ability to perform dressing  tasks.    Time 6   Period Weeks   Status On-going   OT SHORT TERM GOAL #5   Title Pt will increase strength to 4+/5 to increase ability to perform work tasks.    Time 6   Period Weeks   Status On-going                  Plan - 12/13/14 1635    Clinical Impression Statement A: Updated HEP that patient will be using at discharge. Reviewed all exercises. Myofascial release performed to right UE only. Bilateral shoulder stretching completed. Pt reports that right arm felt very stiff and tight today.   Plan P: Follow up on HEP that was given last session. Cont with shoulder strengthening exercises as patient prepares for discharge on Friday.        Problem List Patient Active Problem List   Diagnosis Date Noted  . Coronary atherosclerosis of native coronary artery 04/21/2013  . NSTEMI (non-ST elevated myocardial infarction) 04/21/2013  . Hyperlipidemia 04/21/2013  . Hyperglycemia 04/21/2013    Ailene Ravel, OTR/L,CBIS  [phone]  12/13/2014, 4:42 PM  [place] [address] [place], [place], [address] Phone: [phone]   Fax:  [phone]

## 2014-12-15 ENCOUNTER — Ambulatory Visit (HOSPITAL_COMMUNITY): Payer: No Typology Code available for payment source | Admitting: Occupational Therapy

## 2014-12-15 DIAGNOSIS — M25512 Pain in left shoulder: Secondary | ICD-10-CM

## 2014-12-15 DIAGNOSIS — M25619 Stiffness of unspecified shoulder, not elsewhere classified: Secondary | ICD-10-CM

## 2014-12-15 DIAGNOSIS — M6281 Muscle weakness (generalized): Secondary | ICD-10-CM | POA: Diagnosis not present

## 2014-12-15 DIAGNOSIS — R531 Weakness: Secondary | ICD-10-CM

## 2014-12-15 DIAGNOSIS — M6289 Other specified disorders of muscle: Secondary | ICD-10-CM

## 2014-12-15 DIAGNOSIS — M629 Disorder of muscle, unspecified: Secondary | ICD-10-CM

## 2014-12-15 DIAGNOSIS — M25612 Stiffness of left shoulder, not elsewhere classified: Secondary | ICD-10-CM

## 2014-12-15 DIAGNOSIS — M25611 Stiffness of right shoulder, not elsewhere classified: Secondary | ICD-10-CM

## 2014-12-15 DIAGNOSIS — M25511 Pain in right shoulder: Secondary | ICD-10-CM
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
## 2014-12-15 NOTE — Patient Instructions (Signed)
Face Down Field Goal  Lay face down on a table with your head and shoulders over the edge. Let your arms hang to the ground in a relaxed position. Squeeze your shoulder blades together while keeping your arms straight. Bend your elbows 90 degrees and externally rotate them as far as you comfortably can. Then reverse order.         Lying Arm Raises        Scapular Raises     Standing External Rotation  Stand with your upper arm at your side and bent 90 degrees. Externally rotate (rotate away from your body) your arm while turning your palm upwards.       Behind the Back   Stand with your palm facing forwards holding resistance tubing. Bend elbow and reach behind your back trying to touch the opposite shoulder blade.    v

## 2014-12-17 ENCOUNTER — Encounter (HOSPITAL_COMMUNITY): Payer: Self-pay | Admitting: Occupational Therapy

## 2014-12-17 ENCOUNTER — Ambulatory Visit (HOSPITAL_COMMUNITY): Payer: No Typology Code available for payment source | Attending: Orthopedic Surgery | Admitting: Occupational Therapy

## 2014-12-17 DIAGNOSIS — M25611 Stiffness of right shoulder, not elsewhere classified: Secondary | ICD-10-CM | POA: Insufficient documentation

## 2014-12-17 DIAGNOSIS — M7580 Other shoulder lesions, unspecified shoulder: Secondary | ICD-10-CM | POA: Insufficient documentation

## 2014-12-17 DIAGNOSIS — M6289 Other specified disorders of muscle: Secondary | ICD-10-CM

## 2014-12-17 DIAGNOSIS — M629 Disorder of muscle, unspecified: Secondary | ICD-10-CM | POA: Insufficient documentation

## 2014-12-17 DIAGNOSIS — M25612 Stiffness of left shoulder, not elsewhere classified: Secondary | ICD-10-CM | POA: Insufficient documentation

## 2014-12-17 DIAGNOSIS — M6281 Muscle weakness (generalized): Secondary | ICD-10-CM | POA: Insufficient documentation

## 2014-12-17 DIAGNOSIS — M25511 Pain in right shoulder: Secondary | ICD-10-CM

## 2014-12-17 DIAGNOSIS — R531 Weakness: Secondary | ICD-10-CM

## 2014-12-17 DIAGNOSIS — M25619 Stiffness of unspecified shoulder, not elsewhere classified: Secondary | ICD-10-CM
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
## 2014-12-20 ENCOUNTER — Encounter (HOSPITAL_COMMUNITY): Payer: Self-pay

## 2014-12-22 ENCOUNTER — Encounter (HOSPITAL_COMMUNITY): Payer: Self-pay | Admitting: Occupational Therapy

## 2015-04-11 ENCOUNTER — Institutional Professional Consult (permissible substitution): Payer: PRIVATE HEALTH INSURANCE | Admitting: Neurology

## 2015-06-14 ENCOUNTER — Ambulatory Visit (INDEPENDENT_AMBULATORY_CARE_PROVIDER_SITE_OTHER): Payer: PRIVATE HEALTH INSURANCE | Admitting: Neurology

## 2015-06-14 ENCOUNTER — Encounter: Payer: Self-pay | Admitting: Neurology

## 2015-06-14 VITALS — BP 112/80 | HR 78 | Resp 20 | Ht 73.0 in | Wt 254.0 lb

## 2015-06-14 DIAGNOSIS — G4719 Other hypersomnia: Secondary | ICD-10-CM

## 2015-06-14 DIAGNOSIS — M2619 Other specified anomalies of jaw-cranial base relationship: Secondary | ICD-10-CM | POA: Diagnosis not present

## 2015-06-14 DIAGNOSIS — R0683 Snoring: Secondary | ICD-10-CM

## 2015-06-14 DIAGNOSIS — G4730 Sleep apnea, unspecified: Secondary | ICD-10-CM | POA: Diagnosis not present

## 2015-06-14 DIAGNOSIS — G4710 Hypersomnia, unspecified: Secondary | ICD-10-CM

## 2015-06-14 NOTE — Patient Instructions (Signed)
Hypersomnia Hypersomnia is when you feel extremely tired during the day even though you're getting plenty of sleep at night. You may need to take naps during the day, and you may also be extremely difficult to wake up when you are sleeping.  CAUSES  The cause of your hypersomnia may not be known. Hypersomnia may be caused by:   Medicines.  Sleep disorders, such as narcolepsy.  Trauma or injury to your head or nervous system.  Using drugs or alcohol.  Tumors.  Medical conditions, such as depression or hypothyroidism.  Genetics. SIGNS AND SYMPTOMS  The main symptoms of hypersomnia include:   Feeling extremely tired throughout the day.  Being very difficult to wake up.  Sleeping for longer and longer periods.  Taking naps throughout the day. Other symptoms may include:   Feeling:  Restless.  Annoyed.  Anxious.  Low energy.  Having difficulty:  Remembering.  Speaking.  Thinking.  Losing your appetite.  Experiencing hallucinations. DIAGNOSIS  Hypersomnia may be diagnosed by:  Medical history and physical exam. This will include a sleep history.  Completing sleep logs.  Tests may also be done, such as:  Polysomnography.  Multiple sleep latency test (MSLT). TREATMENT  There is no cure for hypersomnia, but treatment can be very effective in helping manage the condition. Treatment may include:  Lifestyle and sleeping strategies to help cope with the condition.  Stimulant medicines.  Treating any underlying causes of hypersomnia. HOME CARE INSTRUCTIONS  Take medicines only as directed by your health care provider.  Schedule short naps for when you feel sleepiest during the day. Tell your employer or teachers that you have hypersomnia. You may be able to adjust your schedule to include time for naps.  Avoid drinking alcohol or caffeinated beverages.  Do not eat a heavy meal before bedtime. Eat at about the same times every day.  Do not drive or  operate heavy machinery if you are sleepy.  Do not swim or go out on the water without a life jacket.  If possible, adjust your schedule so that you do not have to work or be active at night.  Keep all follow-up visits as directed by your health care provider. This is important. SEEK MEDICAL CARE IF:   You have new symptoms.  Your symptoms get worse. SEEK IMMEDIATE MEDICAL CARE IF:  You have serious thoughts of hurting yourself or someone else.   This information is not intended to replace advice given to you by your health care provider. Make sure you discuss any questions you have with your health care provider.   Document Released: 05/11/2002 Document Revised: 06/11/2014 Document Reviewed: 12/24/2013 Elsevier Interactive Patient Education 2016 [employer]  
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
## 2015-07-25 ENCOUNTER — Ambulatory Visit (INDEPENDENT_AMBULATORY_CARE_PROVIDER_SITE_OTHER): Payer: PRIVATE HEALTH INSURANCE | Admitting: Neurology

## 2015-07-25 DIAGNOSIS — G4733 Obstructive sleep apnea (adult) (pediatric): Secondary | ICD-10-CM | POA: Diagnosis not present

## 2015-07-25 DIAGNOSIS — G4710 Hypersomnia, unspecified: Secondary | ICD-10-CM

## 2015-07-25 DIAGNOSIS — R0683 Snoring: Secondary | ICD-10-CM

## 2015-07-25 DIAGNOSIS — G4719 Other hypersomnia: Secondary | ICD-10-CM

## 2015-07-25 DIAGNOSIS — G4730 Sleep apnea, unspecified: Secondary | ICD-10-CM

## 2015-07-25 DIAGNOSIS — M2619 Other specified anomalies of jaw-cranial base relationship: Secondary | ICD-10-CM

## 2015-07-25 NOTE — Sleep Study (Signed)
Please see the scanned sleep study interpretation located in the Procedure tab within the Chart Review section. 

## 2015-07-28 ENCOUNTER — Telehealth: Payer: Self-pay

## 2015-07-28 DIAGNOSIS — G4733 Obstructive sleep apnea (adult) (pediatric): Secondary | ICD-10-CM

## 2015-07-28 NOTE — Telephone Encounter (Signed)
I spoke to patient and he is aware of results and recommendations. He is willing to do titration study. I will put order in and send copy of report to PCP.

## 2015-08-01 ENCOUNTER — Telehealth: Payer: Self-pay | Admitting: Neurology

## 2015-08-01 NOTE — Telephone Encounter (Signed)
I spoke to pt and went over his sleep study results with him again. Pt has already been scheduled for titration study. Pt verbalized understanding.

## 2015-08-01 NOTE — Telephone Encounter (Signed)
I called the patient to schedule the CPAP titration and he did schedule, however he doesn't remember what you told him about his sleep study results.  He was sick and can't remember the results.  Patient would like a call back from the nurse

## 2015-08-03 ENCOUNTER — Telehealth: Payer: Self-pay | Admitting: Neurology

## 2015-08-03 NOTE — Telephone Encounter (Signed)
This patient has an order for a MSLT.  I see that he had a Split and now a CPAP.  Do I need to schedule a MSLT also?

## 2015-08-08 ENCOUNTER — Encounter (HOSPITAL_COMMUNITY): Payer: Self-pay

## 2015-08-10 ENCOUNTER — Ambulatory Visit (INDEPENDENT_AMBULATORY_CARE_PROVIDER_SITE_OTHER): Payer: PRIVATE HEALTH INSURANCE | Admitting: Neurology

## 2015-08-10 DIAGNOSIS — G4733 Obstructive sleep apnea (adult) (pediatric): Secondary | ICD-10-CM | POA: Diagnosis not present

## 2015-08-10 NOTE — Sleep Study (Signed)
Please see the scanned sleep study interpretation located in the procedure tab in the chart view section.  

## 2015-08-18 ENCOUNTER — Telehealth: Payer: Self-pay

## 2015-08-18 DIAGNOSIS — G4733 Obstructive sleep apnea (adult) (pediatric): Secondary | ICD-10-CM

## 2015-08-18 NOTE — Telephone Encounter (Signed)
I spoke to pt regarding his sleep study results. I advised him that his sleep study showed that cpap was effective and significant improvement was seen with less respiratory events during titration. Dr. Brett Fairy recommends starting a CPAP. Pt is agreeable. Pt was advised to void caffeine containing beverages and chocolate. Pt was advised to use cpap at least four or more hours per night. Pt verbalized understanding. Will send to [facility].

## 2015-08-28 ENCOUNTER — Encounter: Payer: Self-pay | Admitting: *Deleted

## 2015-09-09 ENCOUNTER — Telehealth: Payer: Self-pay

## 2015-09-09 NOTE — Telephone Encounter (Signed)
Devon Sanchez, this patient is having some difficulty with [facility].  He is requesting a call.  [phone]

## 2015-09-12 NOTE — Telephone Encounter (Signed)
I returned pt's call. No answer. Left a message asking him to call me back.

## 2015-09-12 NOTE — Telephone Encounter (Signed)
Called pt back. He stated [facility] called and was telling him about his machine. He wanted to think about copay with insurance and how much it'll cost month to month. He said he called 3-4 times last Friday. He called today. No one has returned his call. He spoke to [place] and LVM for her to call. She has not returned his call. Advised him that Cyril Mourning is gone for the day but I will give her the message so she can look into it tomorrow. He verbalized understanding.

## 2015-09-12 NOTE — Telephone Encounter (Signed)
Patient returned Kristen's call °

## 2015-09-13 NOTE — Telephone Encounter (Signed)
I spoke to pt. He says that at first he was reluctant to start CPAP because of a high deductible that he must meet. Now, he is willing to start CPAP. He has tried several times to reach [facility] and has left several VMs asking them to call him back to discuss payments and getting his cpap but no one has returned his call. I advised him that I would call [facility] and ask them to please give him a call. He has cancelled his follow up with Dr. Brett Fairy because of the late start with his cpap. I advised him to call me back when he starts his cpap to set up a follow up appt with Dr. Brett Fairy. Pt verbalized understanding.  I called [facility] and spoke to [facility] who advised me that she would have [place] call the pt to discuss.

## 2015-10-27 ENCOUNTER — Ambulatory Visit: Payer: Self-pay | Admitting: Neurology

## 2015-11-01 ENCOUNTER — Telehealth: Payer: Self-pay | Admitting: Neurology

## 2015-11-01 NOTE — Telephone Encounter (Signed)
Message For: OFC                  Taken 30-MAY-17 at  2:10PM by BBL ------------------------------------------------------------  Caller  Lenox Ahr              CID  [id number]   Patient  SAME                  Pt's Dr  Beacher May       Area Code  336  Phone#  [phone] [place]  [address] [place] 6/01 APPT @ 1:15 WANTS TO SEE IF HE CAN       GET A LATER TIME ON SAME DAY DUE TO DRIVE             Disp:Y/N  N  If Y = C/B If No Response In 76minutes  ============================================================  Unable to move pt to later time for date of appt. He understood and said he will still be coming

## 2015-11-03 ENCOUNTER — Ambulatory Visit (INDEPENDENT_AMBULATORY_CARE_PROVIDER_SITE_OTHER): Payer: PRIVATE HEALTH INSURANCE | Admitting: Neurology

## 2015-11-03 ENCOUNTER — Encounter: Payer: Self-pay | Admitting: Neurology

## 2015-11-03 VITALS — BP 120/78 | HR 74 | Resp 20 | Ht 73.0 in | Wt 245.0 lb

## 2015-11-03 DIAGNOSIS — G4733 Obstructive sleep apnea (adult) (pediatric): Secondary | ICD-10-CM

## 2015-11-03 DIAGNOSIS — F4310 Post-traumatic stress disorder, unspecified: Secondary | ICD-10-CM | POA: Diagnosis not present

## 2015-11-03 DIAGNOSIS — Z9989 Dependence on other enabling machines and devices: Principal | ICD-10-CM
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
## 2015-11-09 ENCOUNTER — Telehealth: Payer: Self-pay | Admitting: Neurology

## 2015-11-09 NOTE — Telephone Encounter (Signed)
Pt called said he has to fly with his CPAP next. 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 He said [employer] requires a form to be filled out for carry on. Operator gave fax # [phone] Cyril Mourning)

## 2015-11-10 NOTE — Telephone Encounter (Signed)
I spoke to pt. I advised him that his form was filled out and is ready to be picked up. He asked that I fax it back to his work number ([phone]). He cannot drive all the way here to pick it up. Faxed to the number provided, received a receipt of confirmation.

## 2016-11-05 ENCOUNTER — Ambulatory Visit: Payer: PRIVATE HEALTH INSURANCE | Admitting: Neurology

## 2016-12-06 ENCOUNTER — Encounter: Payer: Self-pay | Admitting: Neurology

## 2016-12-06 ENCOUNTER — Ambulatory Visit (INDEPENDENT_AMBULATORY_CARE_PROVIDER_SITE_OTHER): Payer: BLUE CROSS/BLUE SHIELD | Admitting: Neurology

## 2016-12-06 VITALS — BP 118/72 | HR 71 | Ht 73.0 in | Wt 237.0 lb

## 2016-12-06 DIAGNOSIS — F518 Other sleep disorders not due to a substance or known physiological condition: Secondary | ICD-10-CM

## 2016-12-06 DIAGNOSIS — R6889 Other general symptoms and signs: Secondary | ICD-10-CM | POA: Insufficient documentation

## 2016-12-06 DIAGNOSIS — Z9989 Dependence on other enabling machines and devices: Secondary | ICD-10-CM

## 2016-12-06 DIAGNOSIS — G4733 Obstructive sleep apnea (adult) (pediatric): Secondary | ICD-10-CM | POA: Diagnosis not present

## 2016-12-06 NOTE — Patient Instructions (Signed)
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
## 2017-12-10 ENCOUNTER — Ambulatory Visit: Payer: Self-pay | Admitting: Adult Health

## 2018-07-25 ENCOUNTER — Encounter: Payer: Self-pay | Admitting: Cardiology

## 2018-07-25 ENCOUNTER — Ambulatory Visit: Payer: Managed Care, Other (non HMO) | Admitting: Cardiology

## 2018-07-25 VITALS — BP 121/76 | HR 77 | Ht 73.0 in | Wt 253.8 lb

## 2018-07-25 DIAGNOSIS — Q2381 Bicuspid aortic valve: Secondary | ICD-10-CM

## 2018-07-25 DIAGNOSIS — I2510 Atherosclerotic heart disease of native coronary artery without angina pectoris: Secondary | ICD-10-CM | POA: Diagnosis not present

## 2018-07-25 DIAGNOSIS — Z9989 Dependence on other enabling machines and devices: Secondary | ICD-10-CM

## 2018-07-25 DIAGNOSIS — G4733 Obstructive sleep apnea (adult) (pediatric): Secondary | ICD-10-CM

## 2018-07-25 DIAGNOSIS — Q231 Congenital insufficiency of aortic valve: Secondary | ICD-10-CM | POA: Diagnosis not present

## 2018-07-25 DIAGNOSIS — I10 Essential (primary) hypertension: Secondary | ICD-10-CM | POA: Diagnosis not present

## 2018-07-25 DIAGNOSIS — I77810 Thoracic aortic ectasia: Secondary | ICD-10-CM | POA: Diagnosis not present

## 2018-07-25 HISTORY — DX: Congenital insufficiency of aortic valve: Q23.1

## 2018-07-25 HISTORY — DX: Bicuspid aortic valve: Q23.81

## 2018-07-25 MED ORDER — NITROGLYCERIN 0.4 MG SL SUBL: 0.4000 mg | SUBLINGUAL_TABLET | SUBLINGUAL | 3 refills | Status: DC | PRN

## 2018-07-25 NOTE — Progress Notes (Signed)
Subjective:  Primary Physician:  Celene Squibb, MD  Patient ID: Devon Sanchez, male    DOB: 04-27-1951, 68 y.o.   MRN: [id number]  Chief Complaint  Patient presents with  . Follow-up    bicuspid aortic valve/aortic dilation    HPI: Devon Sanchez  is a 68 y.o. male  with CAD diagnosed when he presented in April 2007 with nontransmural myocardial infarction, he had undergone angioplasty to his LAD. due to chest pain, on 04/20/2013 underwent stenting to the proximal and mid LAD and proximal and mid circumflex. He has bicuspid aortic valve with mild stenosis and mild aortic root dilatation. Essentially asymptomatic from cardiac standpoint. Presents for f/u of CAD and valvular heart disease and aortic root dilation. He is presently doing well except has gained his weight back. No chest pain or dyspnea.   Past Medical History:  Diagnosis Date  . Anxiety   . Aortic root dilatation ([place])   . Atherosclerosis   . Bicuspid aortic valve 07/25/2018  . Depression   . Dizziness and giddiness   . Erectile dysfunction   . Hypercholesterolemia   . Hyperglycemia   . Hyperlipidemia   . Hypertension   . MI (myocardial infarction) ([place])   . Midsystolic murmur   . Reflux     Past Surgical History:  Procedure Laterality Date  . COLONOSCOPY N/A 11/04/2014   Procedure: COLONOSCOPY;  Surgeon: Rogene Houston, MD;  [facility];  Service: Endoscopy;  Laterality: N/A;  830 - moved to 6/2 @ 10:30  . CORONARY ANGIOPLASTY WITH STENT PLACEMENT    . LEFT HEART CATHETERIZATION WITH CORONARY ANGIOGRAM N/A 04/20/2013   Procedure: LEFT HEART CATHETERIZATION WITH CORONARY ANGIOGRAM;  Surgeon: Laverda Page, MD;  Location: [facility] CATH LAB;  Service: Cardiovascular;  Laterality: N/A;    Social History   Socioeconomic History  . Marital status: Divorced    Spouse name: Not on file  . Number of children: Not on file  . Years of education: Not on file  . Highest education level: Not on file    Occupational History  . Not on file  Social Needs  . Financial resource strain: Not on file  . Food insecurity:    Worry: Not on file    Inability: Not on file  . Transportation needs:    Medical: Not on file    Non-medical: Not on file  Tobacco Use  . Smoking status: Never Smoker  . Smokeless tobacco: Never Used  Substance and Sexual Activity  . Alcohol use: No  . Drug use: No  . Sexual activity: Never  Lifestyle  . Physical activity:    Days per week: Not on file    Minutes per session: Not on file  . Stress: Not on file  Relationships  . Social connections:    Talks on phone: Not on file    Gets together: Not on file    Attends religious service: Not on file    Active member of club or organization: Not on file    Attends meetings of clubs or organizations: Not on file    Relationship status: Not on file  . Intimate partner violence:    Fear of current or ex partner: Not on file    Emotionally abused: Not on file    Physically abused: Not on file    Forced sexual activity: Not on file  Other Topics Concern  . Not on file  Social History Narrative  Drinks coffee occasionally.    Current Outpatient Medications on File Prior to Visit  Medication Sig Dispense Refill  . acetaminophen (TYLENOL) 500 MG tablet Take 500 mg by mouth every 6 (six) hours as needed.    Marland Kitchen aspirin 81 MG tablet Take 81 mg by mouth daily.    . carvedilol (COREG) 6.25 MG tablet Take 6.25 mg by mouth 2 (two) times daily with a meal.    . ibuprofen (ADVIL,MOTRIN) 400 MG tablet Take 400 mg by mouth every 6 (six) hours as needed.    . Multiple Vitamins-Minerals (CENTRUM SILVER PO) Take by mouth.    . nitroGLYCERIN (NITROSTAT) 0.4 MG SL tablet Place 1 tablet (0.4 mg total) under the tongue every 5 (five) minutes as needed for chest pain (CP or SOB). 30 tablet 12  . rosuvastatin (CRESTOR) 20 MG tablet Take 20 mg by mouth daily.    . sildenafil (REVATIO) 20 MG tablet Take 20 mg by mouth daily as needed  (erectile dysfunction).     Marland Kitchen escitalopram (LEXAPRO) 10 MG tablet Take 5 mg by mouth daily.      No current facility-administered medications on file prior to visit.      Review of Systems  Constitutional: Positive for weight loss. Negative for malaise/fatigue.  Respiratory: Negative for cough, hemoptysis and shortness of breath.   Cardiovascular: Negative for chest pain, palpitations, claudication and leg swelling.  Gastrointestinal: Negative for abdominal pain, blood in stool, constipation, heartburn and vomiting.  Genitourinary: Negative for dysuria.  Musculoskeletal: Negative for joint pain and myalgias.  Neurological: Negative for dizziness, focal weakness and headaches.  Endo/Heme/Allergies: Does not bruise/bleed easily.  Psychiatric/Behavioral: Positive for depression (since retiring but mild). Negative for substance abuse and suicidal ideas. The patient is not nervous/anxious.   All other systems reviewed and are negative.      Objective:  Blood pressure 121/76, pulse 77, height 6\' 1"  (1.854 m), weight 253 lb 12.8 oz (115.1 kg), SpO2 92 %. Body mass index is 33.48 kg/m.  Physical Exam  Constitutional: He appears healthy. No distress.  Eyes: Conjunctivae are normal.  Neck: Neck supple. No JVD present.  Cardiovascular: Regular rhythm, intact distal pulses and normal pulses. Exam reveals no gallop.  Murmur heard.  Medium-pitched mid to late systolic murmur is present with a grade of 3/6 at the upper left sternal border. Pulmonary/Chest: Breath sounds normal. He exhibits no tenderness.  Abdominal: Soft. Bowel sounds are normal.  Musculoskeletal: Normal range of motion.  Neurological: He is alert and oriented to person, place, and time.  Skin: Skin is warm and dry.    CARDIAC STUDIES:   Echocardiogram 03/13/2017:  Echocardiogram 06/24/2018: Left ventricle cavity is normal in size. Moderate concentric hypertrophy of the left ventricle. Normal global wall motion. Doppler  evidence of grade I (impaired) diastolic dysfunction, normal LAP. Calculated EF 62%. Bicuspid aortic valve with fused right and left coronary cusp. Moderate calcification of annulus and leaflets. Moderate-severe aortic valve stenosis. Aortic valve mean gradient of 23 mmHg, Vmax of 3.0 m/s. Calculated aortic valve area by continuity equation is 1.06 cm. DVI marginally above 0.25.  Mild tricuspid regurgitation. Inadequate TR jet to estimate PA systolic pressure. The aortic root is dilated at 4.3 cm, suggesting bicuspid aortopathy. Compared too previous study on 03/13/2017, findings are relatively unchanged.  Assessment & Recommendations:   1. Atherosclerosis of native coronary artery of native heart without angina pectoris Non-ST elevation myocardial infarction 2.  Mid LAD stent 09/30/2005 with 3.5 x 13 mmx2 overlapping stent;  repeat angiography 04/20/2013: Proximal and mid LAD 4.0 x 28 mm stent, proximal and mid circumflex 4.0 x 20 mm Promus Premier drug-eluting stent implantation  - EKG 12-Lead  2. Bicuspid aortic valve  3. Aortic root dilatation ([facility])  4. Essential hypertension  5. OSA on CPAP, compliant  Recommendation: Patient is presently doing well, no recurrence of angina pectoris, blood pressure is well controlled.  I have reviewed the results of the recently performed echocardiogram with the patient, no significant change in the aortic root dilatation of the aortic valve pressure gradients.  He is compliant with CPAP, no changes in the medications were done today.  Again I have discussed with him regarding weight loss.  Since retiring, states that he feels slightly depressed as he does not have a schedule.  Does not appear to be significant however I will make aware of the situation to his PCP.  He has been scheduled for complete physical including labs, I have been prescribing him statins, I would like a CMP, records along with lipid profile.  Otherwise stable from cardiac  standpoint.  He has noticed occasional chest discomfort, describes it as very mild, I have given him a prescription for nitroglycerin, he is aware of the use, aware of PDE 5 inhibitor interaction.  Adrian Prows, MD, [facility] 07/25/2018, 6:57 PM [facility] [place] Pager: [phone] Office: [phone] If no answer Cell [phone]

## 2018-11-15 ENCOUNTER — Other Ambulatory Visit: Payer: Self-pay | Admitting: Cardiology

## 2018-12-24 DIAGNOSIS — Z0000 Encounter for general adult medical examination without abnormal findings: Secondary | ICD-10-CM | POA: Diagnosis not present

## 2019-01-11 ENCOUNTER — Other Ambulatory Visit: Payer: Self-pay | Admitting: Cardiology

## 2019-01-12 NOTE — Telephone Encounter (Signed)
Please fill

## 2019-01-12 NOTE — Telephone Encounter (Signed)
Why dont you fill this??

## 2019-01-29 NOTE — Progress Notes (Signed)
Primary Physician/Referring:  Celene Squibb, MD  Patient ID: Devon Sanchez, male    DOB: February 13, 1951, 68 y.o.   MRN: [id number]  Chief Complaint  Patient presents with  . Coronary Artery Disease  . Bicuspid Aortic Valve  . Follow-up    8mo   HPI:    Devon Sanchez  is a 68 y.o. Caucasian male  with CAD S/P Prox LAD stent in 2007 and again  on 04/20/2013 underwent stenting to the proximal and mid LAD and proximal and mid circumflex.   He has bicuspid aortic valve with mild stenosis and mild aortic root dilatation. Essentially asymptomatic from cardiac standpoint. Presents for f/u of CAD and valvular heart disease and aortic root dilation. He is presently doing well except has gained his weight back. No chest pain or dyspnea. He appears depressed about weight gain. Has not used S/L NTG.   Past Medical History:  Diagnosis Date  . Anxiety   . Aortic root dilatation ([place])   . Atherosclerosis   . Bicuspid aortic valve 07/25/2018  . Depression   . Dizziness and giddiness   . Erectile dysfunction   . Hypercholesterolemia   . Hyperglycemia   . Hyperlipidemia   . Hypertension   . MI (myocardial infarction) ([place])   . Midsystolic murmur   . Reflux    Past Surgical History:  Procedure Laterality Date  . COLONOSCOPY N/A 11/04/2014   Procedure: COLONOSCOPY;  Surgeon: Rogene Houston, MD;  [facility];  Service: Endoscopy;  Laterality: N/A;  830 - moved to 6/2 @ 10:30  . CORONARY ANGIOPLASTY WITH STENT PLACEMENT    . LEFT HEART CATHETERIZATION WITH CORONARY ANGIOGRAM N/A 04/20/2013   Procedure: LEFT HEART CATHETERIZATION WITH CORONARY ANGIOGRAM;  Surgeon: Laverda Page, MD;  Location: [facility] CATH LAB;  Service: Cardiovascular;  Laterality: N/A;   Social History   Socioeconomic History  . Marital status: Married    Spouse name: Not on file  . Number of children: 4  . Years of education: Not on file  . Highest education level: Not on file  Occupational History  . Not on  file  Social Needs  . Financial resource strain: Not on file  . Food insecurity    Worry: Not on file    Inability: Not on file  . Transportation needs    Medical: Not on file    Non-medical: Not on file  Tobacco Use  . Smoking status: Never Smoker  . Smokeless tobacco: Never Used  Substance and Sexual Activity  . Alcohol use: No  . Drug use: No  . Sexual activity: Never  Lifestyle  . Physical activity    Days per week: Not on file    Minutes per session: Not on file  . Stress: Not on file  Relationships  . Social Herbalist on phone: Not on file    Gets together: Not on file    Attends religious service: Not on file    Active member of club or organization: Not on file    Attends meetings of clubs or organizations: Not on file    Relationship status: Not on file  . Intimate partner violence    Fear of current or ex partner: Not on file    Emotionally abused: Not on file    Physically abused: Not on file    Forced sexual activity: Not on file  Other Topics Concern  . Not on file  Social History Narrative  Drinks coffee occasionally.   ROS  Review of Systems  Constitution: Positive for weight gain. Negative for chills, decreased appetite and malaise/fatigue.  Cardiovascular: Negative for dyspnea on exertion, leg swelling and syncope.  Endocrine: Negative for cold intolerance.  Hematologic/Lymphatic: Does not bruise/bleed easily.  Musculoskeletal: Negative for joint swelling.  Gastrointestinal: Negative for abdominal pain, anorexia, change in bowel habit, hematochezia and melena.  Neurological: Negative for headaches and light-headedness.  Psychiatric/Behavioral: Positive for depression. Negative for substance abuse and suicidal ideas.  All other systems reviewed and are negative.  Objective  Blood pressure 129/86, pulse 67, temperature 98.6 F (37 C), height 6\' 1"  (1.854 m), weight 262 lb 8 oz (119.1 kg), SpO2 94 %. Body mass index is 34.63 kg/m.    Physical Exam  Constitutional: No distress.  Well-built and mildly to moderately obese  HENT:  Head: Atraumatic.  Eyes: Conjunctivae are normal.  Neck: Neck supple. No JVD present. No thyromegaly present.  Cardiovascular: Normal rate, regular rhythm, S1 normal, S2 normal, intact distal pulses and normal pulses. Exam reveals no gallop.  Murmur heard.  Harsh midsystolic murmur is present with a grade of 3/6 at the upper right sternal border. Pulses:      Carotid pulses are 2+ on the right side and 2+ on the left side. Pulmonary/Chest: Effort normal and breath sounds normal.  Abdominal: Soft. Bowel sounds are normal.  Musculoskeletal: Normal range of motion.        General: No edema.  Neurological: He is alert.  Skin: Skin is warm and dry.  Psychiatric: He has a normal mood and affect.   Radiology: No results found.  Laboratory examination:   No results for input(s): NA, K, CL, CO2, GLUCOSE, BUN, CREATININE, CALCIUM, GFRNONAA, GFRAA in the last 8760 hours. CMP Latest Ref Rng & Units 04/21/2013 04/20/2013  Glucose 70 - 99 mg/dL 118(H) 118(H)  BUN 6 - 23 mg/dL 12 19  Creatinine 0.50 - 1.35 mg/dL 1.02 1.08  Sodium 135 - 145 mEq/L 135 139  Potassium 3.5 - 5.1 mEq/L 3.8 4.0  Chloride 96 - 112 mEq/L 104 105  CO2 19 - 32 mEq/L 22 24  Calcium 8.4 - 10.5 mg/dL 8.5 9.1   CBC Latest Ref Rng & Units 04/21/2013 04/20/2013  WBC 4.0 - 10.5 K/uL 8.0 5.8  Hemoglobin 13.0 - 17.0 g/dL 13.9 14.6  Hematocrit 39.0 - 52.0 % 38.9(L) 41.1  Platelets 150 - 400 K/uL 179 204   Lipid Panel  No results found for: CHOL, TRIG, HDL, CHOLHDL, VLDL, LDLCALC, LDLDIRECT HEMOGLOBIN A1C Lab Results  Component Value Date   HGBA1C 5.8 (H) 04/20/2013   MPG 120 (H) 04/20/2013   TSH No results for input(s): TSH in the last 8760 hours. Medications   Prior to Admission medications   Medication Sig Start Date End Date Taking? Authorizing Provider  acetaminophen (TYLENOL) 500 MG tablet Take 500 mg by mouth  every 6 (six) hours as needed.    [provider]  aspirin 81 MG tablet Take 81 mg by mouth daily.    [provider]  carvedilol (COREG) 6.25 MG tablet TAKE 1 TABLET BY MOUTH TWO  TIMES DAILY 01/13/19   Adrian Prows, MD  escitalopram (LEXAPRO) 10 MG tablet Take 5 mg by mouth daily.     [provider]  ibuprofen (ADVIL,MOTRIN) 400 MG tablet Take 400 mg by mouth every 6 (six) hours as needed.    [provider]  Multiple Vitamins-Minerals (CENTRUM SILVER PO) Take by mouth.  [provider]  nitroGLYCERIN (NITROSTAT) 0.4 MG SL tablet Place 1 tablet (0.4 mg total) under the tongue every 5 (five) minutes as needed for chest pain (CP or SOB). 04/21/13   Adrian Prows, MD  nitroGLYCERIN (NITROSTAT) 0.4 MG SL tablet Place 1 tablet (0.4 mg total) under the tongue every 5 (five) minutes as needed for up to 25 days for chest pain. 07/25/18 08/19/18  Adrian Prows, MD  rosuvastatin (CRESTOR) 20 MG tablet Take 20 mg by mouth daily.    [provider]  sildenafil (REVATIO) 20 MG tablet Take 20 mg by mouth daily as needed (erectile dysfunction).     [provider]  sildenafil (VIAGRA) 100 MG tablet TAKE 1 TABLET BY MOUTH AS  NEEDED 11/17/18   Adrian Prows, MD     Current Outpatient Medications  Medication Instructions  . acetaminophen (TYLENOL) 500 mg, Oral, Every 6 hours PRN  . aspirin 81 mg, Oral, 2 times daily  . buPROPion (WELLBUTRIN SR) 100 MG 12 hr tablet 1/2 tab daily  . carvedilol (COREG) 6.25 MG tablet TAKE 1 TABLET BY MOUTH TWO  TIMES DAILY  . escitalopram (LEXAPRO) 10 mg, Oral, Daily  . ibuprofen (ADVIL) 400 mg, Oral, Every 6 hours PRN  . losartan (COZAAR) 100 mg, Oral, Daily after supper, Start 1/2 tab daily for 1 week  . Multiple Vitamins-Minerals (CENTRUM SILVER PO) Oral  . nitroGLYCERIN (NITROSTAT) 0.4 mg, Sublingual, Every 5 min PRN  . rosuvastatin (CRESTOR) 20 mg, Oral, Daily  . sildenafil (VIAGRA) 100 MG tablet TAKE 1 TABLET BY MOUTH  AS  NEEDED    Cardiac Studies:   Coronary angiogram 04/20/2013: Non-ST elevation myocardial infarction 2. S/P Proximal and mid LAD 4.0 x 28 mm stent, proximal and mid circumflex 4.0 x 20 mm Promus Premier drug-eluting stent implantation. Mid LAD stent (09/30/2005 with 3.5 x 13 mmx2 overlapping stents) patent.   Echocardiogram 06/24/2018: Left ventricle cavity is normal in size. Moderate concentric hypertrophy of the left ventricle. Normal global wall motion. Doppler evidence of grade I (impaired) diastolic dysfunction, normal LAP. Calculated EF 62%.  Bicuspid aortic valve with fused right and left coronary cusp. Moderate calcification of annulus and leaflets.  Moderate-severe aortic valve stenosis. Aortic valve mean gradient of 23 mmHg, Vmax of 3.0 m/s. Calculated aortic valve area by continuity equation is 1.06 cm. DVI marginally above 0.25.    Mild tricuspid regurgitation. Inadequate TR jet to estimate PA systolic pressure.  The aortic root is dilated at 4.3 cm, suggesting bicuspid aortopathy. Compared too previous study on 03/13/2017, findings are relatively unchanged.  Assessment     ICD-10-CM   1. Atherosclerosis of native coronary artery of native heart without angina pectoris  I25.10 EKG XX123456    Basic metabolic panel  2. Bicuspid aortic valve  Q23.1 EKG 12-Lead    losartan (COZAAR) 100 MG tablet    PCV ECHOCARDIOGRAM COMPLETE  3. Aortic root dilatation (HCC)  I77.810 losartan (COZAAR) 100 MG tablet    PCV ECHOCARDIOGRAM COMPLETE  4. Essential hypertension  I10     EKG 01/30/2019: Sinus rhythm with borderline first-degree AV block at rate of 64 bpm, left atrial enlargement, leftward axis.  Right bundle branch block.  Low-voltage complexes. No significant change from  EKG 07/25/2018:   Recommendations:   Patient presents here for follow-up of coronary artery disease, bicuspid aortic valve and aortic root dilatation.  His main issues lack of weight loss, he is frustrated  about this. I have encouraged him to join [facility]  at [facility]. Bariatric surgery is a good option as well as he has tried and failed weight loss.  Otherwise from cardiac standpoint he remained stable without recurrence of angina pectoris.  He is presently not on ACE inhibitor or  ARB and in view of bicuspid aortic valve, coronary artery disease, aortic root dilatation, I started him on losartan, 100 mg however he'll start with one half tablet daily for a week before increasing it to 100 mg.  Side effects discussed.  He'll obtain a BMP in 2 weeks.  Recently he has had complete physical examination and labs, states that his lipids are normal, he will send me those reports on my chart.  He needs repeat echocardiogram in 6 months for follow-up of aortic root dilatation, I will even consider doing CT angiogram of the chest.  Adrian Prows, MD, [facility] 01/31/2019, 8:54 AM [facility]. [place] Pager: [phone] Office: [phone] If no answer Cell [phone]

## 2019-01-30 ENCOUNTER — Encounter: Payer: Self-pay | Admitting: Cardiology

## 2019-01-30 ENCOUNTER — Other Ambulatory Visit: Payer: Self-pay

## 2019-01-30 ENCOUNTER — Ambulatory Visit (INDEPENDENT_AMBULATORY_CARE_PROVIDER_SITE_OTHER): Payer: Medicare Other | Admitting: Cardiology

## 2019-01-30 VITALS — BP 129/86 | HR 67 | Temp 98.6°F | Ht 73.0 in | Wt 262.5 lb

## 2019-01-30 DIAGNOSIS — I77810 Thoracic aortic ectasia: Secondary | ICD-10-CM

## 2019-01-30 DIAGNOSIS — I2510 Atherosclerotic heart disease of native coronary artery without angina pectoris: Secondary | ICD-10-CM | POA: Diagnosis not present

## 2019-01-30 DIAGNOSIS — I10 Essential (primary) hypertension: Secondary | ICD-10-CM | POA: Diagnosis not present

## 2019-01-30 DIAGNOSIS — Q231 Congenital insufficiency of aortic valve: Secondary | ICD-10-CM | POA: Diagnosis not present

## 2019-01-30 MED ORDER — LOSARTAN POTASSIUM 100 MG PO TABS: 100.0000 mg | ORAL_TABLET | Freq: Every day | ORAL | 2 refills | Status: DC

## 2019-02-20 DIAGNOSIS — I129 Hypertensive chronic kidney disease with stage 1 through stage 4 chronic kidney disease, or unspecified chronic kidney disease: Secondary | ICD-10-CM | POA: Diagnosis not present

## 2019-02-20 DIAGNOSIS — R7303 Prediabetes: Secondary | ICD-10-CM | POA: Diagnosis not present

## 2019-02-20 DIAGNOSIS — E785 Hyperlipidemia, unspecified: Secondary | ICD-10-CM | POA: Diagnosis not present

## 2019-02-20 DIAGNOSIS — Z23 Encounter for immunization: Secondary | ICD-10-CM | POA: Diagnosis not present

## 2019-03-03 DIAGNOSIS — M4602 Spinal enthesopathy, cervical region: Secondary | ICD-10-CM | POA: Diagnosis not present

## 2019-03-03 DIAGNOSIS — M9901 Segmental and somatic dysfunction of cervical region: Secondary | ICD-10-CM | POA: Diagnosis not present

## 2019-03-03 DIAGNOSIS — M542 Cervicalgia: Secondary | ICD-10-CM | POA: Diagnosis not present

## 2019-03-05 DIAGNOSIS — R7303 Prediabetes: Secondary | ICD-10-CM | POA: Diagnosis not present

## 2019-03-05 DIAGNOSIS — E782 Mixed hyperlipidemia: Secondary | ICD-10-CM | POA: Diagnosis not present

## 2019-03-05 DIAGNOSIS — Z23 Encounter for immunization: Secondary | ICD-10-CM | POA: Diagnosis not present

## 2019-03-05 DIAGNOSIS — L239 Allergic contact dermatitis, unspecified cause: Secondary | ICD-10-CM | POA: Diagnosis not present

## 2019-03-06 DIAGNOSIS — M542 Cervicalgia: Secondary | ICD-10-CM | POA: Diagnosis not present

## 2019-03-06 DIAGNOSIS — M4602 Spinal enthesopathy, cervical region: Secondary | ICD-10-CM | POA: Diagnosis not present

## 2019-03-06 DIAGNOSIS — M9901 Segmental and somatic dysfunction of cervical region: Secondary | ICD-10-CM | POA: Diagnosis not present

## 2019-03-09 DIAGNOSIS — M4602 Spinal enthesopathy, cervical region: Secondary | ICD-10-CM | POA: Diagnosis not present

## 2019-03-09 DIAGNOSIS — M542 Cervicalgia: Secondary | ICD-10-CM | POA: Diagnosis not present

## 2019-03-09 DIAGNOSIS — M9901 Segmental and somatic dysfunction of cervical region: Secondary | ICD-10-CM | POA: Diagnosis not present

## 2019-03-12 DIAGNOSIS — M542 Cervicalgia: Secondary | ICD-10-CM | POA: Diagnosis not present

## 2019-03-12 DIAGNOSIS — M9901 Segmental and somatic dysfunction of cervical region: Secondary | ICD-10-CM | POA: Diagnosis not present

## 2019-03-12 DIAGNOSIS — M4602 Spinal enthesopathy, cervical region: Secondary | ICD-10-CM | POA: Diagnosis not present

## 2019-03-26 DIAGNOSIS — D509 Iron deficiency anemia, unspecified: Secondary | ICD-10-CM | POA: Diagnosis not present

## 2019-03-26 DIAGNOSIS — E785 Hyperlipidemia, unspecified: Secondary | ICD-10-CM | POA: Diagnosis not present

## 2019-03-26 DIAGNOSIS — E782 Mixed hyperlipidemia: Secondary | ICD-10-CM | POA: Diagnosis not present

## 2019-03-26 DIAGNOSIS — R7303 Prediabetes: Secondary | ICD-10-CM | POA: Diagnosis not present

## 2019-03-26 DIAGNOSIS — R7301 Impaired fasting glucose: Secondary | ICD-10-CM | POA: Diagnosis not present

## 2019-03-26 DIAGNOSIS — I10 Essential (primary) hypertension: Secondary | ICD-10-CM | POA: Diagnosis not present

## 2019-03-31 DIAGNOSIS — I129 Hypertensive chronic kidney disease with stage 1 through stage 4 chronic kidney disease, or unspecified chronic kidney disease: Secondary | ICD-10-CM | POA: Diagnosis not present

## 2019-03-31 DIAGNOSIS — D509 Iron deficiency anemia, unspecified: Secondary | ICD-10-CM | POA: Diagnosis not present

## 2019-03-31 DIAGNOSIS — E785 Hyperlipidemia, unspecified: Secondary | ICD-10-CM | POA: Diagnosis not present

## 2019-03-31 DIAGNOSIS — N182 Chronic kidney disease, stage 2 (mild): Secondary | ICD-10-CM | POA: Diagnosis not present

## 2019-03-31 DIAGNOSIS — I2510 Atherosclerotic heart disease of native coronary artery without angina pectoris: Secondary | ICD-10-CM | POA: Diagnosis not present

## 2019-04-01 ENCOUNTER — Other Ambulatory Visit: Payer: Self-pay | Admitting: Cardiology

## 2019-04-09 DIAGNOSIS — M4602 Spinal enthesopathy, cervical region: Secondary | ICD-10-CM | POA: Diagnosis not present

## 2019-04-09 DIAGNOSIS — M9901 Segmental and somatic dysfunction of cervical region: Secondary | ICD-10-CM | POA: Diagnosis not present

## 2019-04-09 DIAGNOSIS — M542 Cervicalgia: Secondary | ICD-10-CM | POA: Diagnosis not present

## 2019-04-14 DIAGNOSIS — Z1211 Encounter for screening for malignant neoplasm of colon: Secondary | ICD-10-CM | POA: Diagnosis not present

## 2019-04-16 DIAGNOSIS — M542 Cervicalgia: Secondary | ICD-10-CM | POA: Diagnosis not present

## 2019-04-16 DIAGNOSIS — M4602 Spinal enthesopathy, cervical region: Secondary | ICD-10-CM | POA: Diagnosis not present

## 2019-04-16 DIAGNOSIS — M9901 Segmental and somatic dysfunction of cervical region: Secondary | ICD-10-CM | POA: Diagnosis not present

## 2019-04-21 DIAGNOSIS — M4602 Spinal enthesopathy, cervical region: Secondary | ICD-10-CM | POA: Diagnosis not present

## 2019-04-21 DIAGNOSIS — M542 Cervicalgia: Secondary | ICD-10-CM | POA: Diagnosis not present

## 2019-04-21 DIAGNOSIS — M9901 Segmental and somatic dysfunction of cervical region: Secondary | ICD-10-CM | POA: Diagnosis not present

## 2019-04-28 DIAGNOSIS — M9901 Segmental and somatic dysfunction of cervical region: Secondary | ICD-10-CM | POA: Diagnosis not present

## 2019-04-28 DIAGNOSIS — M4602 Spinal enthesopathy, cervical region: Secondary | ICD-10-CM | POA: Diagnosis not present

## 2019-04-28 DIAGNOSIS — M542 Cervicalgia: Secondary | ICD-10-CM | POA: Diagnosis not present

## 2019-06-09 DIAGNOSIS — R195 Other fecal abnormalities: Secondary | ICD-10-CM | POA: Diagnosis not present

## 2019-06-09 DIAGNOSIS — D509 Iron deficiency anemia, unspecified: Secondary | ICD-10-CM | POA: Diagnosis not present

## 2019-06-09 DIAGNOSIS — Z800 Family history of malignant neoplasm of digestive organs: Secondary | ICD-10-CM | POA: Diagnosis not present

## 2019-06-15 DIAGNOSIS — Z1159 Encounter for screening for other viral diseases: Secondary | ICD-10-CM | POA: Diagnosis not present

## 2019-06-18 DIAGNOSIS — K449 Diaphragmatic hernia without obstruction or gangrene: Secondary | ICD-10-CM | POA: Diagnosis not present

## 2019-06-18 DIAGNOSIS — R195 Other fecal abnormalities: Secondary | ICD-10-CM | POA: Diagnosis not present

## 2019-06-18 DIAGNOSIS — K648 Other hemorrhoids: Secondary | ICD-10-CM | POA: Diagnosis not present

## 2019-06-18 DIAGNOSIS — K2930 Chronic superficial gastritis without bleeding: Secondary | ICD-10-CM | POA: Diagnosis not present

## 2019-06-18 DIAGNOSIS — K635 Polyp of colon: Secondary | ICD-10-CM | POA: Diagnosis not present

## 2019-06-18 DIAGNOSIS — D122 Benign neoplasm of ascending colon: Secondary | ICD-10-CM | POA: Diagnosis not present

## 2019-06-18 DIAGNOSIS — K259 Gastric ulcer, unspecified as acute or chronic, without hemorrhage or perforation: Secondary | ICD-10-CM | POA: Diagnosis not present

## 2019-06-18 DIAGNOSIS — K2970 Gastritis, unspecified, without bleeding: Secondary | ICD-10-CM | POA: Diagnosis not present

## 2019-06-18 DIAGNOSIS — D509 Iron deficiency anemia, unspecified: Secondary | ICD-10-CM | POA: Diagnosis not present

## 2019-06-23 DIAGNOSIS — K635 Polyp of colon: Secondary | ICD-10-CM | POA: Diagnosis not present

## 2019-06-23 DIAGNOSIS — D122 Benign neoplasm of ascending colon: Secondary | ICD-10-CM | POA: Diagnosis not present

## 2019-06-23 DIAGNOSIS — K2930 Chronic superficial gastritis without bleeding: Secondary | ICD-10-CM | POA: Diagnosis not present

## 2019-07-23 ENCOUNTER — Other Ambulatory Visit: Payer: Self-pay | Admitting: Cardiology

## 2019-07-23 DIAGNOSIS — I77810 Thoracic aortic ectasia: Secondary | ICD-10-CM

## 2019-07-23 DIAGNOSIS — Q231 Congenital insufficiency of aortic valve: Secondary | ICD-10-CM

## 2019-07-31 ENCOUNTER — Ambulatory Visit: Payer: Medicare Other

## 2019-07-31 ENCOUNTER — Other Ambulatory Visit: Payer: Self-pay | Admitting: Cardiology

## 2019-07-31 ENCOUNTER — Other Ambulatory Visit: Payer: Self-pay

## 2019-07-31 DIAGNOSIS — I77810 Thoracic aortic ectasia: Secondary | ICD-10-CM | POA: Diagnosis not present

## 2019-07-31 DIAGNOSIS — Q231 Congenital insufficiency of aortic valve: Secondary | ICD-10-CM

## 2019-08-07 ENCOUNTER — Encounter: Payer: Self-pay | Admitting: Cardiology

## 2019-08-07 ENCOUNTER — Ambulatory Visit: Payer: Medicare Other | Admitting: Cardiology

## 2019-08-07 ENCOUNTER — Other Ambulatory Visit: Payer: Self-pay

## 2019-08-07 VITALS — BP 136/94 | HR 68 | Temp 98.4°F | Resp 14 | Ht 73.0 in | Wt 256.4 lb

## 2019-08-07 DIAGNOSIS — I712 Thoracic aortic aneurysm, without rupture, unspecified: Secondary | ICD-10-CM

## 2019-08-07 DIAGNOSIS — R0989 Other specified symptoms and signs involving the circulatory and respiratory systems: Secondary | ICD-10-CM | POA: Diagnosis not present

## 2019-08-07 DIAGNOSIS — Q231 Congenital insufficiency of aortic valve: Secondary | ICD-10-CM | POA: Diagnosis not present

## 2019-08-07 DIAGNOSIS — I2510 Atherosclerotic heart disease of native coronary artery without angina pectoris: Secondary | ICD-10-CM

## 2019-08-07 DIAGNOSIS — I10 Essential (primary) hypertension: Secondary | ICD-10-CM

## 2019-08-07 MED ORDER — LOSARTAN POTASSIUM 50 MG PO TABS: 25.0000 mg | ORAL_TABLET | Freq: Every day | ORAL | 3 refills | Status: DC

## 2019-08-07 NOTE — Progress Notes (Signed)
Primary Physician/Referring:  Celene Squibb, MD  Patient ID: Devon Sanchez, male    DOB: 1951-04-08, 69 y.o.   MRN: [id number]  Chief Complaint  Patient presents with  . Coronary Artery Disease  . Aortic Root Dilation  . Follow-up    6 month   HPI:    Devon Sanchez  is a 69 y.o. Caucasian male  with CAD S/P Prox LAD stent in 2007 and again  on 04/20/2013 underwent stenting to the proximal and mid LAD and proximal and mid circumflex. He has bicuspid aortic valve with mild stenosis and mild aortic root dilatation, hypertension, hyperglycemia, hyperlipidemia, OSA on CPAP.   Essentially asymptomatic from cardiac standpoint.  I have increased his losartan 100 mg daily in view of aortopathy but he felt dizzy and he is now back on 25 mg daily.  Past Medical History:  Diagnosis Date  . Anxiety   . Aortic root dilatation ([place])   . Atherosclerosis   . Bicuspid aortic valve 07/25/2018  . Depression   . Dizziness and giddiness   . Erectile dysfunction   . Hypercholesterolemia   . Hyperglycemia   . Hyperlipidemia   . Hypertension   . MI (myocardial infarction) ([place])   . Midsystolic murmur   . Reflux    Past Surgical History:  Procedure Laterality Date  . COLONOSCOPY N/A 11/04/2014   Procedure: COLONOSCOPY;  Surgeon: Rogene Houston, MD;  [facility];  Service: Endoscopy;  Laterality: N/A;  830 - moved to 6/2 @ 10:30  . CORONARY ANGIOPLASTY WITH STENT PLACEMENT    . LEFT HEART CATHETERIZATION WITH CORONARY ANGIOGRAM N/A 04/20/2013   Procedure: LEFT HEART CATHETERIZATION WITH CORONARY ANGIOGRAM;  Surgeon: Laverda Page, MD;  Location: [facility] CATH LAB;  Service: Cardiovascular;  Laterality: N/A;   Family History  Problem Relation Age of Onset  . Failure to thrive Mother   . Diabetes Mother   . Heart attack Father   . Prostate cancer Father     Social History   Tobacco Use  . Smoking status: Never Smoker  . Smokeless tobacco: Never Used  Substance Use Topics  .  Alcohol use: No   ROS  Review of Systems  Cardiovascular: Negative for chest pain, dyspnea on exertion and leg swelling.  Gastrointestinal: Negative for melena.  Psychiatric/Behavioral: Positive for depression.   Objective  Blood pressure (!) 136/94, pulse 68, temperature 98.4 F (36.9 C), temperature source Temporal, resp. rate 14, height 6\' 1"  (1.854 m), weight 256 lb 6.4 oz (116.3 kg), SpO2 95 %.  Vitals with BMI 08/07/2019 01/30/2019 07/25/2018  Height 6\' 1"  6\' 1"  -  Weight 256 lbs 6 oz 262 lbs 8 oz -  BMI [id number] -  Systolic XX123456 [id number]  Diastolic 94 86 76  Pulse 68 67 77     Physical Exam  Constitutional: No distress.  Well-built and mildly to moderately obese  Cardiovascular: Normal rate, regular rhythm, S1 normal, S2 normal, intact distal pulses and normal pulses. Exam reveals no gallop.  Murmur heard.  Harsh midsystolic murmur is present with a grade of 3/6 at the upper right sternal border radiating to the neck. Pulses:      Carotid pulses are 2+ on the right side with bruit and 2+ on the left side with bruit. There is no leg edema.  No JVD.   Pulmonary/Chest: Effort normal and breath sounds normal.  Abdominal: Soft. Bowel sounds are normal. There is no rebound.   Laboratory  examination:   No results for input(s): NA, K, CL, CO2, GLUCOSE, BUN, CREATININE, CALCIUM, GFRNONAA, GFRAA in the last 8760 hours. CrCl cannot be calculated (Patient's most recent lab result is older than the maximum 21 days allowed.).  CMP Latest Ref Rng & Units 04/21/2013 04/20/2013  Glucose 70 - 99 mg/dL 118(H) 118(H)  BUN 6 - 23 mg/dL 12 19  Creatinine 0.50 - 1.35 mg/dL 1.02 1.08  Sodium 135 - 145 mEq/L 135 139  Potassium 3.5 - 5.1 mEq/L 3.8 4.0  Chloride 96 - 112 mEq/L 104 105  CO2 19 - 32 mEq/L 22 24  Calcium 8.4 - 10.5 mg/dL 8.5 9.1   CBC Latest Ref Rng & Units 04/21/2013 04/20/2013  WBC 4.0 - 10.5 K/uL 8.0 5.8  Hemoglobin 13.0 - 17.0 g/dL 13.9 14.6  Hematocrit 39.0 - 52.0 %  38.9(L) 41.1  Platelets 150 - 400 K/uL 179 204   Lipid Panel  No results found for: CHOL, TRIG, HDL, CHOLHDL, VLDL, LDLCALC, LDLDIRECT HEMOGLOBIN A1C Lab Results  Component Value Date   HGBA1C 5.8 (H) 04/20/2013   MPG 120 (H) 04/20/2013   TSH No results for input(s): TSH in the last 8760 hours.  External labs:   Cholesterol, total 144.000 M 03/26/2019 HDL 37.000 M 03/26/2019 LDL N/D Triglycerides 164.000 M 03/26/2019  A1C 5.800 % 03/26/2019; TSH 3.100 07/17/2016  Creatinine, Serum 1.490 MG/ 03/26/2019 Potassium 4.200 MM 01/10/2016 ALT (SGPT) 24.000 IU/ 03/26/2019  Hemoglobin 11.900 G/ 03/26/2019; Platelets 213.000 X 03/26/2019 Medications and allergies   Allergies  Allergen Reactions  . Bupropion Other (See Comments)    Other Reaction: MADE CHEST HURT  . Naproxen Sodium Other (See Comments)    Other Reaction: GI BLEEDING     Current Outpatient Medications  Medication Instructions  . acetaminophen (TYLENOL) 500 mg, Oral, Every 6 hours PRN  . aspirin 81 mg, Oral, 2 times daily  . carvedilol (COREG) 6.25 MG tablet TAKE 1 TABLET BY MOUTH TWO  TIMES DAILY  . losartan (COZAAR) 25 mg, Oral, Daily  . Multiple Vitamins-Minerals (CENTRUM SILVER PO) Oral  . nitroGLYCERIN (NITROSTAT) 0.4 mg, Sublingual, Every 5 min PRN  . rosuvastatin (CRESTOR) 20 MG tablet TAKE 1 TABLET BY MOUTH  DAILY  . sildenafil (VIAGRA) 100 MG tablet TAKE 1 TABLET BY MOUTH AS  NEEDED    Radiology:   No results found.  Cardiac Studies:   Coronary angiogram 04/20/2013: Non-ST elevation myocardial infarction 2. S/P Proximal and mid LAD 4.0 x 28 mm stent, proximal and mid circumflex 4.0 x 20 mm Promus Premier drug-eluting stent implantation. Mid LAD stent (09/30/2005 with 3.5 x 13 mmx2 overlapping stents) patent.  Echocardiogram 08/04/2019:  Left ventricle cavity is normal in size. Mild concentric hypertrophy of the left ventricle. Normal LV systolic function with EF 64%. Normal global wall motion.  Doppler evidence of grade I (impaired) diastolic dysfunction, normal LAP.  Bicuspid aortic valve with fused left and right coronary cusp. Severe calcification of noncoronary cusp. Moderate aortic stenosis. Trace aortic regurgitation. Aortic valve mean gradient of 25 mmHg, Vmax of 3.2 m/s. Calculated aortic valve area by continuity equation is 1.2 cm.  Mild tricuspid regurgitation. Estimated pulmonary artery systolic pressure is 19 mmHg.  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  Compared to previous study on 06/25/2019, proximal ascending aorta dimension increased from 4.3 cm to 4.5 cm, s/p bicuspid aortopathy.   EKG 08/07/2019: Normal sinus rhythm at rate of 72 bpm, left right axis deviation, left posterior fascicular block, right bundle branch block.  Consider  RVH/pulmonary disease pattern. Compared to 01/30/2019, right axis deviation is new.   Assessment     ICD-10-CM   1. Atherosclerosis of native coronary artery of native heart without angina pectoris  I25.10 EKG 12-Lead  2. Bicuspid aortic valve  Q23.1   3. Bicuspid aortic valve with ascending aorta 4.0 to 4.5 cm in diameter  Q23.1 CT CHEST WO CONTRAST  4. Essential hypertension  I10   5. Thoracic aortic aneurysm without rupture (HCC)  I71.2 CT CHEST WO CONTRAST  6. Bilateral carotid bruits  R09.89 PCV CAROTID DUPLEX (BILATERAL)    Meds ordered this encounter  Medications  . losartan (COZAAR) 50 MG tablet    Sig: Take 0.5 tablets (25 mg total) by mouth daily.    Dispense:  90 tablet    Refill:  3    Medications Discontinued During This Encounter  Medication Reason  . buPROPion (WELLBUTRIN SR) 100 MG 12 hr tablet Error  . escitalopram (LEXAPRO) 10 MG tablet Error  . ibuprofen (ADVIL,MOTRIN) 400 MG tablet Error  . losartan (COZAAR) 100 MG tablet Error  . losartan (COZAAR) 25 MG tablet Reorder    Recommendations:   Devon Sanchez  is a 69 y.o. Caucasian male  with CAD S/P Prox LAD stent in 2007 and again  on 04/20/2013 underwent  stenting to the proximal and mid LAD and proximal and mid circumflex. He has bicuspid aortic valve with mild stenosis and mild aortic root dilatation, hypertension, hyperglycemia, hyperlipidemia, OSA on CPAP.  His blood pressure slightly elevated today in office.  I have increased his losartan back from 25 mg to 50 mg, previously I prescribed him 100 g which he had discontinued due to dizziness.  Advised him to take it in the evening and see if he can uptitrated to 100 mg daily in view of bicuspid aortic valve and aortopathy.  Reviewed his echocardiogram, needs CT scan of the chest to evaluate for aortic size. Schedule for carotid duplex for bruit/follow-up surveillance of carotid stenosis.   Otherwise he remains stable, no angina,  labs reviewed, I will see him back in 1 year or sooner if problems.  He has been compliant with CPAP.  Adrian Prows, MD, [facility] 08/07/2019, 11:19 AM [facility]. [place] Office: [phone]

## 2019-08-13 ENCOUNTER — Ambulatory Visit
Admission: RE | Admit: 2019-08-13 | Discharge: 2019-08-13 | Disposition: A | Discharge status: Home / Self Care | Payer: BLUE CROSS/BLUE SHIELD | Source: Ambulatory Visit | Attending: Cardiology | Admitting: Cardiology

## 2019-08-13 ENCOUNTER — Other Ambulatory Visit: Payer: BLUE CROSS/BLUE SHIELD

## 2019-08-13 ENCOUNTER — Other Ambulatory Visit: Payer: Self-pay

## 2019-08-13 DIAGNOSIS — Q231 Congenital insufficiency of aortic valve: Secondary | ICD-10-CM

## 2019-08-13 DIAGNOSIS — I712 Thoracic aortic aneurysm, without rupture, unspecified: Secondary | ICD-10-CM

## 2019-08-18 DIAGNOSIS — Z961 Presence of intraocular lens: Secondary | ICD-10-CM | POA: Diagnosis not present

## 2019-08-19 ENCOUNTER — Ambulatory Visit: Payer: Medicare Other

## 2019-08-19 ENCOUNTER — Telehealth: Payer: Self-pay

## 2019-08-19 ENCOUNTER — Other Ambulatory Visit: Payer: Self-pay

## 2019-08-19 DIAGNOSIS — R0989 Other specified symptoms and signs involving the circulatory and respiratory systems: Secondary | ICD-10-CM | POA: Diagnosis not present

## 2019-08-19 NOTE — Telephone Encounter (Signed)
I replied on my chart. Change his OV to 6 months.

## 2019-08-19 NOTE — Telephone Encounter (Signed)
-----   Message from [name] sent at 08/19/2019 10:37 AM EDT ----- Regarding: cta results Pt requested the status of his CTA of chest results. Please call pt

## 2019-08-19 NOTE — Telephone Encounter (Signed)
Patient requesting results of CTA chest. Result note says result will be discussed at next appointment. His next f/u visit is 08/08/20.

## 2019-08-20 ENCOUNTER — Telehealth: Payer: Self-pay | Admitting: Cardiology

## 2019-08-20 NOTE — Telephone Encounter (Signed)
Patient needs to scheduled for 6 month follow up.

## 2019-08-20 NOTE — Telephone Encounter (Signed)
This was done earlier.

## 2019-08-23 ENCOUNTER — Other Ambulatory Visit: Payer: Self-pay | Admitting: Cardiology

## 2019-08-23 DIAGNOSIS — R0989 Other specified symptoms and signs involving the circulatory and respiratory systems: Secondary | ICD-10-CM

## 2019-08-23 DIAGNOSIS — I6522 Occlusion and stenosis of left carotid artery: Secondary | ICD-10-CM

## 2019-09-01 ENCOUNTER — Telehealth: Payer: Self-pay

## 2019-09-01 NOTE — Telephone Encounter (Signed)
-----   Message from [name] sent at 08/19/2019 10:37 AM EDT ----- Regarding: cta results Pt requested the status of his CTA of chest results. Please call pt

## 2019-09-01 NOTE — Telephone Encounter (Signed)
Left message to patient regarding aortic aneurysm. Since he has Bicuspid AV, will probably need surgery soon. Please schedule for an OV in 2 months from now.

## 2019-09-01 NOTE — Telephone Encounter (Signed)
Message Contents  Tamala Fothergill [facility]  Pt requested the status of his CTA of chest results. Please call pt   cta results Received: 1 week ago  Please advise.

## 2019-09-25 DIAGNOSIS — D649 Anemia, unspecified: Secondary | ICD-10-CM | POA: Diagnosis not present

## 2019-09-25 DIAGNOSIS — K449 Diaphragmatic hernia without obstruction or gangrene: Secondary | ICD-10-CM | POA: Diagnosis not present

## 2019-09-25 DIAGNOSIS — Z86010 Personal history of colonic polyps: Secondary | ICD-10-CM | POA: Diagnosis not present

## 2019-10-20 ENCOUNTER — Encounter (INDEPENDENT_AMBULATORY_CARE_PROVIDER_SITE_OTHER): Payer: Self-pay | Admitting: *Deleted

## 2019-10-30 ENCOUNTER — Encounter: Payer: Self-pay | Admitting: Cardiology

## 2019-10-30 ENCOUNTER — Ambulatory Visit: Payer: Medicare Other | Admitting: Cardiology

## 2019-10-30 VITALS — BP 112/77 | HR 64 | Resp 15 | Ht 73.0 in | Wt 244.0 lb

## 2019-10-30 DIAGNOSIS — Q231 Congenital insufficiency of aortic valve: Secondary | ICD-10-CM

## 2019-10-30 DIAGNOSIS — I2510 Atherosclerotic heart disease of native coronary artery without angina pectoris: Secondary | ICD-10-CM | POA: Diagnosis not present

## 2019-10-30 DIAGNOSIS — I6522 Occlusion and stenosis of left carotid artery: Secondary | ICD-10-CM | POA: Diagnosis not present

## 2019-10-30 DIAGNOSIS — I10 Essential (primary) hypertension: Secondary | ICD-10-CM

## 2019-10-30 DIAGNOSIS — I350 Nonrheumatic aortic (valve) stenosis: Secondary | ICD-10-CM

## 2019-10-30 DIAGNOSIS — E782 Mixed hyperlipidemia: Secondary | ICD-10-CM

## 2019-10-30 DIAGNOSIS — G4733 Obstructive sleep apnea (adult) (pediatric): Secondary | ICD-10-CM

## 2019-10-30 MED ORDER — LOSARTAN POTASSIUM 50 MG PO TABS: 50.0000 mg | ORAL_TABLET | Freq: Every day | ORAL | 3 refills | Status: DC

## 2019-10-30 NOTE — Progress Notes (Signed)
Primary Physician/Referring:  Celene Squibb, MD  Patient ID: Devon Sanchez, male    DOB: 10-13-1950, 69 y.o.   MRN: [id number]  Chief Complaint  Patient presents with  . Follow-up  . Results   HPI:    Devon Sanchez  is a 69 y.o. Caucasian male  with CAD S/P Prox LAD stent in 2007 and again  on 04/20/2013 underwent stenting to the proximal and mid LAD and proximal and mid circumflex. He has bicuspid aortic valve with mild stenosis and mild aortic root dilatation, hypertension, hyperglycemia, hyperlipidemia, OSA on CPAP.  Patient presents to the office today to review the test results of his most recent echocardiogram, carotid duplex, and CT chest.  Patient is informed that his CTA chest without contrast that the ascending thoracic aorta is dilated up to 5 cm.  He also has aortic valve calcification and coronary artery calcification.  CT scan also noted a small nodule in the left lung base measuring approximately 4 mm.  Patient is a non-smoker but he is recommended to discuss this finding with his PCP and may benefit from re-evaluation with non-contrast chest CT but will defer to primary team at this time.   His most recent echocardiogram notes preserved left ventricular systolic function and bicuspid aortic valve with fused left and right coronary cusp with moderate aortic stenosis and trace AR (per report).  Patient denies any symptoms of angina, congestive heart failure, or syncope.  He also does not have any pain between his shoulder blades.  Patient's blood pressure has improved since last office visit.  His office blood pressure is currently at goal.   Past Medical History:  Diagnosis Date  . Anxiety   . Aortic root dilatation ([place])   . Atherosclerosis   . Bicuspid aortic valve 07/25/2018  . Depression   . Dizziness and giddiness   . Erectile dysfunction   . Hypercholesterolemia   . Hyperglycemia   . Hyperlipidemia   . Hypertension   . MI (myocardial infarction) ([place])   .  Midsystolic murmur   . Reflux    Past Surgical History:  Procedure Laterality Date  . COLONOSCOPY N/A 11/04/2014   Procedure: COLONOSCOPY;  Surgeon: Rogene Houston, MD;  Location: [facility];  Service: Endoscopy;  Laterality: N/A;  830 - moved to 6/2 @ 10:30  . CORONARY ANGIOPLASTY WITH STENT PLACEMENT    . LEFT HEART CATHETERIZATION WITH CORONARY ANGIOGRAM N/A 04/20/2013   Procedure: LEFT HEART CATHETERIZATION WITH CORONARY ANGIOGRAM;  Surgeon: Laverda Page, MD;  Location: [facility] CATH LAB;  Service: Cardiovascular;  Laterality: N/A;   Family History  Problem Relation Age of Onset  . Failure to thrive Mother   . Diabetes Mother   . Heart attack Father   . Prostate cancer Father     Social History   Tobacco Use  . Smoking status: Never Smoker  . Smokeless tobacco: Never Used  Substance Use Topics  . Alcohol use: No   Marital Status: Married ROS  Review of Systems  Cardiovascular: Negative for chest pain, dyspnea on exertion and leg swelling.  Gastrointestinal: Negative for melena.  Psychiatric/Behavioral: Positive for depression.   Objective  Blood pressure 112/77, pulse 64, resp. rate 15, height 6\' 1"  (1.854 m), weight 244 lb (110.7 kg), SpO2 94 %.  Vitals with BMI 10/30/2019 08/07/2019 01/30/2019  Height 6\' 1"  6\' 1"  6\' 1"   Weight 244 lbs 256 lbs 6 oz 262 lbs 8 oz  BMI 32.2 33.84 34.64  Systolic [id number]  Diastolic 77 94 86  Pulse 64 68 67     Physical Exam  Constitutional: No distress.  Well-built and mildly to moderately obese  Cardiovascular: Normal rate, regular rhythm, S1 normal, S2 normal, intact distal pulses and normal pulses. Exam reveals no gallop.  Murmur heard.  Harsh midsystolic murmur is present with a grade of 3/6 at the upper right sternal border radiating to the neck. Pulses:      Carotid pulses are 2+ on the right side with bruit and 2+ on the left side with bruit. There is no leg edema.  No JVD.   Pulmonary/Chest: Effort normal and breath  sounds normal.  Abdominal: Soft. Bowel sounds are normal. There is no rebound.   Laboratory examination:   No results for input(s): NA, K, CL, CO2, GLUCOSE, BUN, CREATININE, CALCIUM, GFRNONAA, GFRAA in the last 8760 hours. CrCl cannot be calculated (Patient's most recent lab result is older than the maximum 21 days allowed.).  CMP Latest Ref Rng & Units 04/21/2013 04/20/2013  Glucose 70 - 99 mg/dL 118(H) 118(H)  BUN 6 - 23 mg/dL 12 19  Creatinine 0.50 - 1.35 mg/dL 1.02 1.08  Sodium 135 - 145 mEq/L 135 139  Potassium 3.5 - 5.1 mEq/L 3.8 4.0  Chloride 96 - 112 mEq/L 104 105  CO2 19 - 32 mEq/L 22 24  Calcium 8.4 - 10.5 mg/dL 8.5 9.1   CBC Latest Ref Rng & Units 04/21/2013 04/20/2013  WBC 4.0 - 10.5 K/uL 8.0 5.8  Hemoglobin 13.0 - 17.0 g/dL 13.9 14.6  Hematocrit 39.0 - 52.0 % 38.9(L) 41.1  Platelets 150 - 400 K/uL 179 204   Lipid Panel  No results found for: CHOL, TRIG, HDL, CHOLHDL, VLDL, LDLCALC, LDLDIRECT HEMOGLOBIN A1C Lab Results  Component Value Date   HGBA1C 5.8 (H) 04/20/2013   MPG 120 (H) 04/20/2013   TSH No results for input(s): TSH in the last 8760 hours.  External labs:   Cholesterol, total 144.000 M 03/26/2019 HDL 37.000 M 03/26/2019 LDL N/D Triglycerides 164.000 M 03/26/2019  A1C 5.800 % 03/26/2019; TSH 3.100 07/17/2016  Creatinine, Serum 1.490 MG/ 03/26/2019 Potassium 4.200 MM 01/10/2016 ALT (SGPT) 24.000 IU/ 03/26/2019  Hemoglobin 11.900 G/ 03/26/2019; Platelets 213.000 X 03/26/2019  Medications and allergies   Allergies  Allergen Reactions  . Bupropion Other (See Comments)    Other Reaction: MADE CHEST HURT  . Naproxen Sodium Other (See Comments)    Other Reaction: GI BLEEDING     Current Outpatient Medications  Medication Instructions  . acetaminophen (TYLENOL) 500 mg, Oral, Every 6 hours PRN  . aspirin 81 mg, Oral, 2 times daily  . carvedilol (COREG) 6.25 MG tablet TAKE 1 TABLET BY MOUTH TWO  TIMES DAILY  . escitalopram (LEXAPRO) 10 mg,  Oral, Daily  . losartan (COZAAR) 50 mg, Oral, Daily  . Multiple Vitamins-Minerals (CENTRUM SILVER PO) Oral  . nitroGLYCERIN (NITROSTAT) 0.4 mg, Sublingual, Every 5 min PRN  . rosuvastatin (CRESTOR) 20 MG tablet TAKE 1 TABLET BY MOUTH  DAILY  . sildenafil (VIAGRA) 100 MG tablet TAKE 1 TABLET BY MOUTH AS  NEEDED    Radiology:  CT Chest 08/13/2019 1. 5 cm dilation of ascending thoracic aorta, likely associated with aortic stenosis due to dense calcifications about the aortic valve. Ascending thoracic aortic aneurysm. Recommend semi-annual imaging followup by CTA or MRA and referral to cardiothoracic surgery if not already obtained. Aortic aneurysm NOS 2. Coronary artery calcifications and signs of previous percutaneous coronary intervention. 3.  4 mm nodule in the left lung base. 4. 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  Aortic Atherosclerosis Aortic aneurysm NOS  Cardiac Studies:   Coronary angiogram 04/20/2013: Non-ST elevation myocardial infarction 2. S/P Proximal and mid LAD 4.0 x 28 mm stent, proximal and mid circumflex 4.0 x 20 mm Promus Premier drug-eluting stent implantation. Mid LAD stent (09/30/2005 with 3.5 x 13 mmx2 overlapping stents) patent.  Echocardiogram 08/04/2019:  Left ventricle cavity is normal in size. Mild concentric hypertrophy of the left ventricle. Normal LV systolic function with EF 64%. Normal global wall motion. Doppler evidence of grade I (impaired) diastolic dysfunction, normal LAP.  Bicuspid aortic valve with fused left and right coronary cusp. Severe calcification of noncoronary cusp. Moderate aortic stenosis. Trace aortic regurgitation. Aortic valve mean gradient of 25 mmHg, Vmax of 3.2 m/s. Calculated aortic valve area by continuity equation is 1.2 cm.  Mild tricuspid regurgitation. Estimated pulmonary artery systolic pressure is 19 mmHg.  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  Compared to previous study on 06/25/2019, proximal ascending  aorta dimension increased from 4.3 cm to 4.5 cm, s/p bicuspid aortopathy.   Carotid artery duplex 08/19/2019:  Duplex suggests stenosis in the right internal carotid artery (1-15%).  Duplex suggests stenosis in the left internal carotid artery (16-49%).  Antegrade right vertebral artery flow. Antegrade left vertebral artery flow.  Follow up in one year is appropriate if clinically indicated. Compared to 05/31/2015, left ICA stenosis new.   EKG: 08/07/2019: Normal sinus rhythm at rate of 72 bpm, left right axis deviation, left posterior fascicular block, right bundle branch block.  Consider RVH/pulmonary disease pattern. Compared to 01/30/2019, right axis deviation is new.   Assessment     ICD-10-CM   1. Bicuspid aortic valve with ascending aorta 5.0 cm in diameter  Q23.1   2. Bicuspid aortic valve  Q23.1   3. Biscuspid aortic stenosis, moderate  I35.0   4. Atherosclerosis of native coronary artery of native heart without angina pectoris  I25.10   5. Asymptomatic stenosis of left carotid artery  I65.22   6. Essential hypertension  I10 losartan (COZAAR) 50 MG tablet  7. OSA on CPAP  G47.33    Z99.89   8. Mixed hyperlipidemia  E78.2     Meds ordered this encounter  Medications  . losartan (COZAAR) 50 MG tablet    Sig: Take 1 tablet (50 mg total) by mouth daily.    Dispense:  90 tablet    Refill:  3    Recommendations:   Devon Sanchez  is a 69 y.o. Caucasian male  with CAD S/P Prox LAD stent in 2007 and again  on 04/20/2013 underwent stenting to the proximal and mid LAD and proximal and mid circumflex. He has bicuspid aortic valve with moderate stenosis and aortic root dilatation of 5cm, hypertension, hyperglycemia, hyperlipidemia, OSA on CPAP.  Ascending aortic dilatation and Moderate asymptomatic bicuspid aortic stenosis:  In setting of bicuspid aortic valve most recent CT chest without contrast noting an ascending aortic dilatation at 5 cm patient is educated on being seen by  cardiothoracic surgery. But prior to that recommend left heart catheterization to evaluate for obstructive coronary disease.  Patient educated on importance of blood pressure management.  Patient states that his home blood pressures are usually 117mmHg.   Patient denies angina, heart failure, or syncope.   Patient is educated on avoiding isometric contractions and/or Valsalva maneuver.  Patient is educated on seeking medical attention sooner at the closest ER  via EMS if he has any chest pain or pain between the shoulder blades.  The left heart catheterization procedure was explained to the patient in detail. The indication, alternatives, risks and benefits were reviewed. Complications including but not limited to bleeding, infection, acute kidney injury, blood transfusion, heart rhythm disturbances, contrast (dye) reaction, damage to the arteries or nerves in the legs or hands, cerebrovascular accident, myocardial infarction, need for emergent bypass surgery, blood clots in the legs, possible need for emergent blood transfusion, and rarely death were reviewed and discussed with the patient. The patient voices understanding and wishes to proceed.   Plan BMP and CBC and Covid 19 screen prior to [facility].   Carotid artery atherosclerosis: Continue aspirin and statin therapy.  Most recent carotid duplex reviewed with the patient.  Benign essential hypertension: Patient's blood pressure at today's office visit is well controlled.  Patient states that her blood pressures are also well controlled.  Continue losartan 50 mg p.o. daily.  CT scan also noted a small nodule in the left lung base measuring approximately 4 mm.  Patient is a non-smoker but he is recommended to discuss this finding with his PCP and may benefit from re-evaluation with non-contrast chest CT but will defer to primary team at this time.   Total encounter time 40 minutes. Had a long discussion with the patient in regards to the findings of  the CT scan, echocardiogram, and carotid duplex.  Complex decision making, coordination of care, and educating the patient regarding the disease process and educating him why left heart catheterization is essential step prior to seeing cardiothoracic surgery in consultation.  We discussed the risks, benefits, and alternatives to left heart catheterization as discussed above.   Patient will follow up with Dr. Einar Gip once his left heart catheterization is complete.  Rex Kras, [place], [facility]  Pager: [phone] Office: [phone]

## 2019-10-30 NOTE — H&P (View-Only) (Signed)
Primary Physician/Referring:  Celene Squibb, MD  Patient ID: Devon Sanchez, male    DOB: 1950/08/12, 69 y.o.   MRN: [id number]  Chief Complaint  Patient presents with  . Follow-up  . Results   HPI:    Devon Sanchez  is a 69 y.o. Caucasian male  with CAD S/P Prox LAD stent in 2007 and again  on 04/20/2013 underwent stenting to the proximal and mid LAD and proximal and mid circumflex. He has bicuspid aortic valve with mild stenosis and mild aortic root dilatation, hypertension, hyperglycemia, hyperlipidemia, OSA on CPAP.  Patient presents to the office today to review the test results of his most recent echocardiogram, carotid duplex, and CT chest.  Patient is informed that his CTA chest without contrast that the ascending thoracic aorta is dilated up to 5 cm.  He also has aortic valve calcification and coronary artery calcification.  CT scan also noted a small nodule in the left lung base measuring approximately 4 mm.  Patient is a non-smoker but he is recommended to discuss this finding with his PCP and may benefit from re-evaluation with non-contrast chest CT but will defer to primary team at this time.   His most recent echocardiogram notes preserved left ventricular systolic function and bicuspid aortic valve with fused left and right coronary cusp with moderate aortic stenosis and trace AR (per report).  Patient denies any symptoms of angina, congestive heart failure, or syncope.  He also does not have any pain between his shoulder blades.  Patient's blood pressure has improved since last office visit.  His office blood pressure is currently at goal.   Past Medical History:  Diagnosis Date  . Anxiety   . Aortic root dilatation ([place])   . Atherosclerosis   . Bicuspid aortic valve 07/25/2018  . Depression   . Dizziness and giddiness   . Erectile dysfunction   . Hypercholesterolemia   . Hyperglycemia   . Hyperlipidemia   . Hypertension   . MI (myocardial infarction) ([place])   .  Midsystolic murmur   . Reflux    Past Surgical History:  Procedure Laterality Date  . COLONOSCOPY N/A 11/04/2014   Procedure: COLONOSCOPY;  Surgeon: Rogene Houston, MD;  Location: [facility];  Service: Endoscopy;  Laterality: N/A;  830 - moved to 6/2 @ 10:30  . CORONARY ANGIOPLASTY WITH STENT PLACEMENT    . LEFT HEART CATHETERIZATION WITH CORONARY ANGIOGRAM N/A 04/20/2013   Procedure: LEFT HEART CATHETERIZATION WITH CORONARY ANGIOGRAM;  Surgeon: Laverda Page, MD;  Location: [facility] CATH LAB;  Service: Cardiovascular;  Laterality: N/A;   Family History  Problem Relation Age of Onset  . Failure to thrive Mother   . Diabetes Mother   . Heart attack Father   . Prostate cancer Father     Social History   Tobacco Use  . Smoking status: Never Smoker  . Smokeless tobacco: Never Used  Substance Use Topics  . Alcohol use: No   Marital Status: Married ROS  Review of Systems  Cardiovascular: Negative for chest pain, dyspnea on exertion and leg swelling.  Gastrointestinal: Negative for melena.  Psychiatric/Behavioral: Positive for depression.   Objective  Blood pressure 112/77, pulse 64, resp. rate 15, height 6\' 1"  (1.854 m), weight 244 lb (110.7 kg), SpO2 94 %.  Vitals with BMI 10/30/2019 08/07/2019 01/30/2019  Height 6\' 1"  6\' 1"  6\' 1"   Weight 244 lbs 256 lbs 6 oz 262 lbs 8 oz  BMI 32.2 33.84 34.64  Systolic [id number]  Diastolic 77 94 86  Pulse 64 68 67     Physical Exam  Constitutional: No distress.  Well-built and mildly to moderately obese  Cardiovascular: Normal rate, regular rhythm, S1 normal, S2 normal, intact distal pulses and normal pulses. Exam reveals no gallop.  Murmur heard.  Harsh midsystolic murmur is present with a grade of 3/6 at the upper right sternal border radiating to the neck. Pulses:      Carotid pulses are 2+ on the right side with bruit and 2+ on the left side with bruit. There is no leg edema.  No JVD.   Pulmonary/Chest: Effort normal and breath  sounds normal.  Abdominal: Soft. Bowel sounds are normal. There is no rebound.   Laboratory examination:   No results for input(s): NA, K, CL, CO2, GLUCOSE, BUN, CREATININE, CALCIUM, GFRNONAA, GFRAA in the last 8760 hours. CrCl cannot be calculated (Patient's most recent lab result is older than the maximum 21 days allowed.).  CMP Latest Ref Rng & Units 04/21/2013 04/20/2013  Glucose 70 - 99 mg/dL 118(H) 118(H)  BUN 6 - 23 mg/dL 12 19  Creatinine 0.50 - 1.35 mg/dL 1.02 1.08  Sodium 135 - 145 mEq/L 135 139  Potassium 3.5 - 5.1 mEq/L 3.8 4.0  Chloride 96 - 112 mEq/L 104 105  CO2 19 - 32 mEq/L 22 24  Calcium 8.4 - 10.5 mg/dL 8.5 9.1   CBC Latest Ref Rng & Units 04/21/2013 04/20/2013  WBC 4.0 - 10.5 K/uL 8.0 5.8  Hemoglobin 13.0 - 17.0 g/dL 13.9 14.6  Hematocrit 39.0 - 52.0 % 38.9(L) 41.1  Platelets 150 - 400 K/uL 179 204   Lipid Panel  No results found for: CHOL, TRIG, HDL, CHOLHDL, VLDL, LDLCALC, LDLDIRECT HEMOGLOBIN A1C Lab Results  Component Value Date   HGBA1C 5.8 (H) 04/20/2013   MPG 120 (H) 04/20/2013   TSH No results for input(s): TSH in the last 8760 hours.  External labs:   Cholesterol, total 144.000 M 03/26/2019 HDL 37.000 M 03/26/2019 LDL N/D Triglycerides 164.000 M 03/26/2019  A1C 5.800 % 03/26/2019; TSH 3.100 07/17/2016  Creatinine, Serum 1.490 MG/ 03/26/2019 Potassium 4.200 MM 01/10/2016 ALT (SGPT) 24.000 IU/ 03/26/2019  Hemoglobin 11.900 G/ 03/26/2019; Platelets 213.000 X 03/26/2019  Medications and allergies   Allergies  Allergen Reactions  . Bupropion Other (See Comments)    Other Reaction: MADE CHEST HURT  . Naproxen Sodium Other (See Comments)    Other Reaction: GI BLEEDING     Current Outpatient Medications  Medication Instructions  . acetaminophen (TYLENOL) 500 mg, Oral, Every 6 hours PRN  . aspirin 81 mg, Oral, 2 times daily  . carvedilol (COREG) 6.25 MG tablet TAKE 1 TABLET BY MOUTH TWO  TIMES DAILY  . escitalopram (LEXAPRO) 10 mg,  Oral, Daily  . losartan (COZAAR) 50 mg, Oral, Daily  . Multiple Vitamins-Minerals (CENTRUM SILVER PO) Oral  . nitroGLYCERIN (NITROSTAT) 0.4 mg, Sublingual, Every 5 min PRN  . rosuvastatin (CRESTOR) 20 MG tablet TAKE 1 TABLET BY MOUTH  DAILY  . sildenafil (VIAGRA) 100 MG tablet TAKE 1 TABLET BY MOUTH AS  NEEDED    Radiology:  CT Chest 08/13/2019 1. 5 cm dilation of ascending thoracic aorta, likely associated with aortic stenosis due to dense calcifications about the aortic valve. Ascending thoracic aortic aneurysm. Recommend semi-annual imaging followup by CTA or MRA and referral to cardiothoracic surgery if not already obtained. Aortic aneurysm NOS 2. Coronary artery calcifications and signs of previous percutaneous coronary intervention. 3.  4 mm nodule in the left lung base. 4. 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  Aortic Atherosclerosis Aortic aneurysm NOS  Cardiac Studies:   Coronary angiogram 04/20/2013: Non-ST elevation myocardial infarction 2. S/P Proximal and mid LAD 4.0 x 28 mm stent, proximal and mid circumflex 4.0 x 20 mm Promus Premier drug-eluting stent implantation. Mid LAD stent (09/30/2005 with 3.5 x 13 mmx2 overlapping stents) patent.  Echocardiogram 08/04/2019:  Left ventricle cavity is normal in size. Mild concentric hypertrophy of the left ventricle. Normal LV systolic function with EF 64%. Normal global wall motion. Doppler evidence of grade I (impaired) diastolic dysfunction, normal LAP.  Bicuspid aortic valve with fused left and right coronary cusp. Severe calcification of noncoronary cusp. Moderate aortic stenosis. Trace aortic regurgitation. Aortic valve mean gradient of 25 mmHg, Vmax of 3.2 m/s. Calculated aortic valve area by continuity equation is 1.2 cm.  Mild tricuspid regurgitation. Estimated pulmonary artery systolic pressure is 19 mmHg.  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  Compared to previous study on 06/25/2019, proximal ascending  aorta dimension increased from 4.3 cm to 4.5 cm, s/p bicuspid aortopathy.   Carotid artery duplex 08/19/2019:  Duplex suggests stenosis in the right internal carotid artery (1-15%).  Duplex suggests stenosis in the left internal carotid artery (16-49%).  Antegrade right vertebral artery flow. Antegrade left vertebral artery flow.  Follow up in one year is appropriate if clinically indicated. Compared to 05/31/2015, left ICA stenosis new.   EKG: 08/07/2019: Normal sinus rhythm at rate of 72 bpm, left right axis deviation, left posterior fascicular block, right bundle branch block.  Consider RVH/pulmonary disease pattern. Compared to 01/30/2019, right axis deviation is new.   Assessment     ICD-10-CM   1. Bicuspid aortic valve with ascending aorta 5.0 cm in diameter  Q23.1   2. Bicuspid aortic valve  Q23.1   3. Biscuspid aortic stenosis, moderate  I35.0   4. Atherosclerosis of native coronary artery of native heart without angina pectoris  I25.10   5. Asymptomatic stenosis of left carotid artery  I65.22   6. Essential hypertension  I10 losartan (COZAAR) 50 MG tablet  7. OSA on CPAP  G47.33    Z99.89   8. Mixed hyperlipidemia  E78.2     Meds ordered this encounter  Medications  . losartan (COZAAR) 50 MG tablet    Sig: Take 1 tablet (50 mg total) by mouth daily.    Dispense:  90 tablet    Refill:  3    Recommendations:   Devon Sanchez  is a 69 y.o. Caucasian male  with CAD S/P Prox LAD stent in 2007 and again  on 04/20/2013 underwent stenting to the proximal and mid LAD and proximal and mid circumflex. He has bicuspid aortic valve with moderate stenosis and aortic root dilatation of 5cm, hypertension, hyperglycemia, hyperlipidemia, OSA on CPAP.  Ascending aortic dilatation and Moderate asymptomatic bicuspid aortic stenosis:  In setting of bicuspid aortic valve most recent CT chest without contrast noting an ascending aortic dilatation at 5 cm patient is educated on being seen by  cardiothoracic surgery. But prior to that recommend left heart catheterization to evaluate for obstructive coronary disease.  Patient educated on importance of blood pressure management.  Patient states that his home blood pressures are usually 12mmHg.   Patient denies angina, heart failure, or syncope.   Patient is educated on avoiding isometric contractions and/or Valsalva maneuver.  Patient is educated on seeking medical attention sooner at the closest ER  via EMS if he has any chest pain or pain between the shoulder blades.  The left heart catheterization procedure was explained to the patient in detail. The indication, alternatives, risks and benefits were reviewed. Complications including but not limited to bleeding, infection, acute kidney injury, blood transfusion, heart rhythm disturbances, contrast (dye) reaction, damage to the arteries or nerves in the legs or hands, cerebrovascular accident, myocardial infarction, need for emergent bypass surgery, blood clots in the legs, possible need for emergent blood transfusion, and rarely death were reviewed and discussed with the patient. The patient voices understanding and wishes to proceed.   Plan BMP and CBC and Covid 19 screen prior to [facility].   Carotid artery atherosclerosis: Continue aspirin and statin therapy.  Most recent carotid duplex reviewed with the patient.  Benign essential hypertension: Patient's blood pressure at today's office visit is well controlled.  Patient states that her blood pressures are also well controlled.  Continue losartan 50 mg p.o. daily.  CT scan also noted a small nodule in the left lung base measuring approximately 4 mm.  Patient is a non-smoker but he is recommended to discuss this finding with his PCP and may benefit from re-evaluation with non-contrast chest CT but will defer to primary team at this time.   Total encounter time 40 minutes. Had a long discussion with the patient in regards to the findings of  the CT scan, echocardiogram, and carotid duplex.  Complex decision making, coordination of care, and educating the patient regarding the disease process and educating him why left heart catheterization is essential step prior to seeing cardiothoracic surgery in consultation.  We discussed the risks, benefits, and alternatives to left heart catheterization as discussed above.   Patient will follow up with Dr. Einar Gip once his left heart catheterization is complete.  Rex Kras, [place], [facility]  Pager: [phone] Office: [phone]

## 2019-11-06 ENCOUNTER — Other Ambulatory Visit (HOSPITAL_COMMUNITY)
Admission: RE | Admit: 2019-11-06 | Discharge: 2019-11-06 | Disposition: A | Discharge status: Home / Self Care | Payer: Medicare Other | Source: Ambulatory Visit | Attending: Cardiology | Admitting: Cardiology

## 2019-11-06 DIAGNOSIS — Z20822 Contact with and (suspected) exposure to covid-19: Secondary | ICD-10-CM | POA: Insufficient documentation

## 2019-11-06 DIAGNOSIS — Z01812 Encounter for preprocedural laboratory examination: Secondary | ICD-10-CM | POA: Insufficient documentation

## 2019-11-07 LAB — SARS CORONAVIRUS 2 (TAT 6-24 HRS): SARS Coronavirus 2: NEGATIVE

## 2019-11-10 ENCOUNTER — Other Ambulatory Visit: Payer: Self-pay

## 2019-11-10 ENCOUNTER — Encounter (HOSPITAL_COMMUNITY)
Admission: RE | Disposition: A | Discharge status: Home / Self Care | Payer: Self-pay | Source: Home / Self Care | Attending: Cardiology

## 2019-11-10 ENCOUNTER — Ambulatory Visit (HOSPITAL_COMMUNITY)
Admission: RE | Admit: 2019-11-10 | Discharge: 2019-11-10 | Disposition: A | Discharge status: Home / Self Care | Payer: Medicare Other | Attending: Cardiology | Admitting: Cardiology

## 2019-11-10 DIAGNOSIS — Q231 Congenital insufficiency of aortic valve: Secondary | ICD-10-CM | POA: Diagnosis not present

## 2019-11-10 DIAGNOSIS — Z888 Allergy status to other drugs, medicaments and biological substances status: Secondary | ICD-10-CM | POA: Insufficient documentation

## 2019-11-10 DIAGNOSIS — I6522 Occlusion and stenosis of left carotid artery: Secondary | ICD-10-CM | POA: Diagnosis not present

## 2019-11-10 DIAGNOSIS — I2510 Atherosclerotic heart disease of native coronary artery without angina pectoris: Secondary | ICD-10-CM | POA: Diagnosis not present

## 2019-11-10 DIAGNOSIS — Z79899 Other long term (current) drug therapy: Secondary | ICD-10-CM | POA: Insufficient documentation

## 2019-11-10 DIAGNOSIS — E782 Mixed hyperlipidemia: Secondary | ICD-10-CM | POA: Insufficient documentation

## 2019-11-10 DIAGNOSIS — I252 Old myocardial infarction: Secondary | ICD-10-CM | POA: Insufficient documentation

## 2019-11-10 DIAGNOSIS — I350 Nonrheumatic aortic (valve) stenosis: Secondary | ICD-10-CM | POA: Diagnosis not present

## 2019-11-10 DIAGNOSIS — G4733 Obstructive sleep apnea (adult) (pediatric): Secondary | ICD-10-CM | POA: Diagnosis not present

## 2019-11-10 DIAGNOSIS — Z8249 Family history of ischemic heart disease and other diseases of the circulatory system: Secondary | ICD-10-CM | POA: Insufficient documentation

## 2019-11-10 DIAGNOSIS — Z955 Presence of coronary angioplasty implant and graft: Secondary | ICD-10-CM | POA: Insufficient documentation

## 2019-11-10 DIAGNOSIS — I10 Essential (primary) hypertension: Secondary | ICD-10-CM | POA: Insufficient documentation

## 2019-11-10 DIAGNOSIS — I77810 Thoracic aortic ectasia: Secondary | ICD-10-CM | POA: Diagnosis not present

## 2019-11-10 DIAGNOSIS — Z886 Allergy status to analgesic agent status: Secondary | ICD-10-CM | POA: Diagnosis not present

## 2019-11-10 DIAGNOSIS — Z7982 Long term (current) use of aspirin: Secondary | ICD-10-CM | POA: Diagnosis not present

## 2019-11-10 DIAGNOSIS — E7800 Pure hypercholesterolemia, unspecified: Secondary | ICD-10-CM | POA: Insufficient documentation

## 2019-11-10 HISTORY — PX: RIGHT/LEFT HEART CATH AND CORONARY ANGIOGRAPHY: CATH118266

## 2019-11-10 LAB — POCT I-STAT EG7
Acid-Base Excess: 2 mmol/L (ref 0.0–2.0)
Bicarbonate: 27 mmol/L (ref 20.0–28.0)
Calcium, Ion: 1.3 mmol/L (ref 1.15–1.40)
HCT: 37 % — ABNORMAL LOW (ref 39.0–52.0)
Hemoglobin: 12.6 g/dL — ABNORMAL LOW (ref 13.0–17.0)
O2 Saturation: 78 %
Potassium: 4.3 mmol/L (ref 3.5–5.1)
Sodium: 142 mmol/L (ref 135–145)
TCO2: 28 mmol/L (ref 22–32)
pCO2, Ven: 44.4 mmHg (ref 44.0–60.0)
pH, Ven: 7.392 (ref 7.250–7.430)
pO2, Ven: 43 mmHg (ref 32.0–45.0)

## 2019-11-10 LAB — BASIC METABOLIC PANEL
Anion gap: 10 (ref 5–15)
BUN: 17 mg/dL (ref 8–23)
CO2: 23 mmol/L (ref 22–32)
Calcium: 8.8 mg/dL — ABNORMAL LOW (ref 8.9–10.3)
Chloride: 103 mmol/L (ref 98–111)
Creatinine, Ser: 1.08 mg/dL (ref 0.61–1.24)
GFR calc Af Amer: >60 mL/min (ref >60)
GFR calc non Af Amer: >60 mL/min (ref >60)
Glucose, Bld: 106 mg/dL — ABNORMAL HIGH (ref 70–99)
Potassium: 4 mmol/L (ref 3.5–5.1)
Sodium: 136 mmol/L (ref 135–145)

## 2019-11-10 LAB — CBC
HCT: 40 % (ref 39.0–52.0)
Hemoglobin: 13.5 g/dL (ref 13.0–17.0)
MCH: 32.6 pg (ref 26.0–34.0)
MCHC: 33.8 g/dL (ref 30.0–36.0)
MCV: 96.6 fL (ref 80.0–100.0)
Platelets: 193 10*3/uL (ref 150–400)
RBC: 4.14 MIL/uL — ABNORMAL LOW (ref 4.22–5.81)
RDW: 13.5 % (ref 11.5–15.5)
WBC: 4.8 10*3/uL (ref 4.0–10.5)
nRBC: 0 % (ref 0.0–0.2)

## 2019-11-10 SURGERY — RIGHT/LEFT HEART CATH AND CORONARY ANGIOGRAPHY
Anesthesia: LOCAL

## 2019-11-10 MED ORDER — ASPIRIN 81 MG PO CHEW: 81.0000 mg | CHEWABLE_TABLET | ORAL | Status: DC

## 2019-11-10 MED ORDER — MIDAZOLAM HCL 2 MG/2ML IJ SOLN
INTRAMUSCULAR | Status: DC | PRN
  Administered 2019-11-10: 1 mg via INTRAVENOUS

## 2019-11-10 MED ORDER — HEPARIN (PORCINE) IN NACL 1000-0.9 UT/500ML-% IV SOLN
INTRAVENOUS | Status: DC | PRN
  Administered 2019-11-10 (×2): 500 mL

## 2019-11-10 MED ORDER — VERAPAMIL HCL 2.5 MG/ML IV SOLN
INTRAVENOUS | Status: DC | PRN
  Administered 2019-11-10: 10 mL via INTRA_ARTERIAL

## 2019-11-10 MED ORDER — LIDOCAINE HCL (PF) 1 % IJ SOLN
INTRAMUSCULAR | Status: DC | PRN
  Administered 2019-11-10 (×2): 2 mL via SUBCUTANEOUS

## 2019-11-10 MED ORDER — MIDAZOLAM HCL 2 MG/2ML IJ SOLN
INTRAMUSCULAR | Status: AC
  Filled 2019-11-10: qty 2

## 2019-11-10 MED ORDER — FENTANYL CITRATE (PF) 100 MCG/2ML IJ SOLN
INTRAMUSCULAR | Status: AC
  Filled 2019-11-10: qty 2

## 2019-11-10 MED ORDER — HEPARIN SODIUM (PORCINE) 1000 UNIT/ML IJ SOLN
INTRAMUSCULAR | Status: AC
  Filled 2019-11-10: qty 1

## 2019-11-10 MED ORDER — FENTANYL CITRATE (PF) 100 MCG/2ML IJ SOLN
INTRAMUSCULAR | Status: DC | PRN
  Administered 2019-11-10: 50 ug via INTRAVENOUS

## 2019-11-10 MED ORDER — SODIUM CHLORIDE 0.9 % WEIGHT BASED INFUSION
3.0000 mL/kg/h | INTRAVENOUS | Status: AC
  Administered 2019-11-10: 3 mL/kg/h via INTRAVENOUS

## 2019-11-10 MED ORDER — HEPARIN SODIUM (PORCINE) 1000 UNIT/ML IJ SOLN
INTRAMUSCULAR | Status: DC | PRN
  Administered 2019-11-10: 5000 [IU] via INTRAVENOUS

## 2019-11-10 MED ORDER — SODIUM CHLORIDE 0.9% FLUSH: 3.0000 mL | INTRAVENOUS | Status: DC | PRN

## 2019-11-10 MED ORDER — LIDOCAINE HCL (PF) 1 % IJ SOLN
INTRAMUSCULAR | Status: AC
  Filled 2019-11-10: qty 30

## 2019-11-10 MED ORDER — HEPARIN (PORCINE) IN NACL 1000-0.9 UT/500ML-% IV SOLN
INTRAVENOUS | Status: AC
  Filled 2019-11-10: qty 1000

## 2019-11-10 MED ORDER — SODIUM CHLORIDE 0.9 % IV SOLN: 250.0000 mL | INTRAVENOUS | Status: DC | PRN

## 2019-11-10 MED ORDER — VERAPAMIL HCL 2.5 MG/ML IV SOLN
INTRAVENOUS | Status: AC
  Filled 2019-11-10: qty 2

## 2019-11-10 MED ORDER — SODIUM CHLORIDE 0.9 % WEIGHT BASED INFUSION: 1.0000 mL/kg/h | INTRAVENOUS | Status: DC

## 2019-11-10 MED ORDER — IOHEXOL 350 MG/ML SOLN
INTRAVENOUS | Status: DC | PRN
  Administered 2019-11-10: 65 mL via INTRA_ARTERIAL

## 2019-11-10 MED ORDER — SODIUM CHLORIDE 0.9% FLUSH: 3.0000 mL | Freq: Two times a day (BID) | INTRAVENOUS | Status: DC

## 2019-11-10 SURGICAL SUPPLY — 16 items
CATH 5FR JL3.5 JR4 ANG PIG MP (CATHETERS) ×1 IMPLANT
CATH BALLN WEDGE 5F 110CM (CATHETERS) ×1 IMPLANT
CATH INFINITI 5 FR AL2 (CATHETERS) ×1 IMPLANT
CATH INFINITI 5FR AL1 (CATHETERS) ×1 IMPLANT
CATH INFINITI 5FR AL3 (CATHETERS) ×1 IMPLANT
CATH INFINITI 5FR JK (CATHETERS) ×1 IMPLANT
CATH INFINITI 5FR JL5 (CATHETERS) ×1 IMPLANT
DEVICE RAD COMP TR BAND LRG (VASCULAR PRODUCTS) ×1 IMPLANT
GLIDESHEATH SLEND A-KIT 6F 22G (SHEATH) ×1 IMPLANT
GUIDEWIRE INQWIRE 1.5J.035X260 (WIRE) IMPLANT
INQWIRE 1.5J .035X260CM (WIRE) ×2
KIT HEART LEFT (KITS) ×2 IMPLANT
PACK CARDIAC CATHETERIZATION (CUSTOM PROCEDURE TRAY) ×2 IMPLANT
SHEATH GLIDE SLENDER 4/5FR (SHEATH) ×1 IMPLANT
TRANSDUCER W/STOPCOCK (MISCELLANEOUS) ×2 IMPLANT
TUBING CIL FLEX 10 FLL-RA (TUBING) ×2 IMPLANT

## 2019-11-10 NOTE — Research (Signed)
[place] Informed Consent   Subject Name: Devon Sanchez  Subject met inclusion and exclusion criteria.  The informed consent form, study requirements and expectations were reviewed with the subject and questions and concerns were addressed prior to the signing of the consent form.  The subject verbalized understanding of the trail requirements.  The subject agreed to participate in the [facility] trial and signed the informed consent.  The informed consent was obtained prior to performance of any protocol-specific procedures for the subject.  A copy of the signed informed consent was given to the subject and a copy was placed in the subject's medical record.  Mena Goes. 11/10/2019, 11:20 AM

## 2019-11-10 NOTE — Interval H&P Note (Signed)
History and Physical Interval Note:  11/10/2019 3:20 PM  Devon Sanchez  has presented today for surgery, with the diagnosis of chest pain.  The various methods of treatment have been discussed with the patient and family. After consideration of risks, benefits and other options for treatment, the patient has consented to  Procedure(s): RIGHT/LEFT HEART CATH AND CORONARY ANGIOGRAPHY (N/A) as a surgical intervention.  The patient's history has been reviewed, patient examined, no change in status, stable for surgery.  I have reviewed the patient's chart and labs.  Questions were answered to the patient's satisfaction.    2012 Appropriate Use Criteria for Diagnostic Catheterization Home / Select Test of Interest Indication for [facility] Valvular Disease Valvular Disease (Right and Left Heart Catheterization or Right Heart Catheterization Alone With or  Valvular Disease  (Right and Left Heart Catheterization or Right Heart Catheterization  Alone With or Without Left Ventriculography and Coronary Angiography) Link Here: [email or web address]fi Indication:  Preoperative assessment before valvular surgery A (7) Indication: 70; Score 7   Serigne Kubicek J Indigo Barbian

## 2019-11-10 NOTE — Discharge Instructions (Signed)
Radial Site Care  This sheet gives you information about how to care for yourself after your procedure. Your health care provider may also give you more specific instructions. If you have problems or questions, contact your health care provider. What can I expect after the procedure? After the procedure, it is common to have:  Bruising and tenderness at the catheter insertion area. Follow these instructions at home: Medicines  Take over-the-counter and prescription medicines only as told by your health care provider. Insertion site care  Follow instructions from your health care provider about how to take care of your insertion site. Make sure you: ? Wash your hands with soap and water before you change your bandage (dressing). If soap and water are not available, use hand sanitizer. ? Change your dressing as told by your health care provider. ? Leave stitches (sutures), skin glue, or adhesive strips in place. These skin closures may need to stay in place for 2 weeks or longer. If adhesive strip edges start to loosen and curl up, you may trim the loose edges. Do not remove adhesive strips completely unless your health care provider tells you to do that.  Check your insertion site every day for signs of infection. Check for: ? Redness, swelling, or pain. ? Fluid or blood. ? Pus or a bad smell. ? Warmth.  Do not take baths, swim, or use a hot tub until your health care provider approves.  You may shower 24-48 hours after the procedure, or as directed by your health care provider. ? Remove the dressing and gently wash the site with plain soap and water. ? Pat the area dry with a clean towel. ? Do not rub the site. That could cause bleeding.  Do not apply powder or lotion to the site. Activity   For 24 hours after the procedure, or as directed by your health care provider: ? Do not flex or bend the affected arm. ? Do not push or pull heavy objects with the affected arm. ? Do not  drive yourself home from the hospital or clinic. You may drive 24 hours after the procedure unless your health care provider tells you not to. ? Do not operate machinery or power tools.  Do not lift anything that is heavier than 10 lb (4.5 kg), or the limit that you are told, until your health care provider says that it is safe.  Ask your health care provider when it is okay to: ? Return to work or school. ? Resume usual physical activities or sports. ? Resume sexual activity. General instructions  If the catheter site starts to bleed, raise your arm and put firm pressure on the site. If the bleeding does not stop, get help right away. This is a medical emergency.  If you went home on the same day as your procedure, a responsible adult should be with you for the first 24 hours after you arrive home.  Keep all follow-up visits as told by your health care provider. This is important. Contact a health care provider if:  You have a fever.  You have redness, swelling, or yellow drainage around your insertion site. Get help right away if:  You have unusual pain at the radial site.  The catheter insertion area swells very fast.  The insertion area is bleeding, and the bleeding does not stop when you hold steady pressure on the area.  Your arm or hand becomes pale, cool, tingly, or numb. These symptoms may represent a serious problem   that is an emergency. Do not wait to see if the symptoms will go away. Get medical help right away. Call your local emergency services (911 in the [place] Do not drive yourself to the hospital. Summary  After the procedure, it is common to have bruising and tenderness at the site.  Follow instructions from your health care provider about how to take care of your radial site wound. Check the wound every day for signs of infection.  Do not lift anything that is heavier than 10 lb (4.5 kg), or the limit that you are told, until your health care provider says  that it is safe. This information is not intended to replace advice given to you by your health care provider. Make sure you discuss any questions you have with your health care provider. Document Revised: 06/26/2017 Document Reviewed: 06/26/2017 Elsevier Patient Education  2020 [employer]  

## 2019-11-30 ENCOUNTER — Other Ambulatory Visit: Payer: Self-pay | Admitting: Cardiology

## 2019-12-11 ENCOUNTER — Encounter: Payer: Self-pay | Admitting: Cardiology

## 2019-12-11 ENCOUNTER — Ambulatory Visit: Payer: Medicare Other | Admitting: Cardiology

## 2019-12-11 ENCOUNTER — Other Ambulatory Visit: Payer: Self-pay

## 2019-12-11 VITALS — BP 117/79 | HR 65 | Resp 16 | Ht 73.0 in | Wt 243.0 lb

## 2019-12-11 DIAGNOSIS — Q231 Congenital insufficiency of aortic valve: Secondary | ICD-10-CM

## 2019-12-11 DIAGNOSIS — I2510 Atherosclerotic heart disease of native coronary artery without angina pectoris: Secondary | ICD-10-CM

## 2019-12-11 DIAGNOSIS — Z9989 Dependence on other enabling machines and devices: Secondary | ICD-10-CM

## 2019-12-11 DIAGNOSIS — G4733 Obstructive sleep apnea (adult) (pediatric): Secondary | ICD-10-CM | POA: Diagnosis not present

## 2019-12-11 NOTE — Progress Notes (Signed)
Primary Physician/Referring:  Celene Squibb, MD  Patient ID: Devon Sanchez, male    DOB: 12-19-1950, 69 y.o.   MRN: [id number]  Chief Complaint  Patient presents with  . Follow-up    6 weeks  . surgical evaluation   HPI:    Devon Sanchez  is a 69 y.o. Caucasian male  with CAD S/P Prox LAD stent in 2007 and again  on 04/20/2013 underwent stenting to the proximal and mid LAD and proximal and mid circumflex. He has bicuspid aortic valve with mild stenosis and mild aortic root dilatation, hypertension, hyperglycemia, hyperlipidemia, OSA on CPAP. 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   Essentially asymptomatic from cardiac standpoint. Underwent cardiac catheterization presents for follow-up. No complications from the procedure.  Past Medical History:  Diagnosis Date  . Anxiety   . Aortic root dilatation ([place])   . Atherosclerosis   . Bicuspid aortic valve 07/25/2018  . Depression   . Dizziness and giddiness   . Erectile dysfunction   . Hypercholesterolemia   . Hyperglycemia   . Hyperlipidemia   . Hypertension   . MI (myocardial infarction) ([place])   . Midsystolic murmur   . Reflux    Past Surgical History:  Procedure Laterality Date  . COLONOSCOPY N/A 11/04/2014   Procedure: COLONOSCOPY;  Surgeon: Rogene Houston, MD;  [facility];  Service: Endoscopy;  Laterality: N/A;  830 - moved to 6/2 @ 10:30  . CORONARY ANGIOPLASTY WITH STENT PLACEMENT    . LEFT HEART CATHETERIZATION WITH CORONARY ANGIOGRAM N/A 04/20/2013   Procedure: LEFT HEART CATHETERIZATION WITH CORONARY ANGIOGRAM;  Surgeon: Laverda Page, MD;  Location: [facility] CATH LAB;  Service: Cardiovascular;  Laterality: N/A;  . RIGHT/LEFT HEART CATH AND CORONARY ANGIOGRAPHY N/A 11/10/2019   Procedure: RIGHT/LEFT HEART CATH AND CORONARY ANGIOGRAPHY;  Surgeon: Nigel Mormon, MD;  Location: [facility] CV LAB;  Service: Cardiovascular;  Laterality:  N/A;   Family History  Problem Relation Age of Onset  . Failure to thrive Mother   . Diabetes Mother   . Heart attack Father   . Prostate cancer Father     Social History   Tobacco Use  . Smoking status: Never Smoker  . Smokeless tobacco: Never Used  Substance Use Topics  . Alcohol use: No   ROS  Review of Systems  Cardiovascular: Negative for chest pain, dyspnea on exertion and leg swelling.  Gastrointestinal: Negative for melena.  Psychiatric/Behavioral: Positive for depression.   Objective  Blood pressure 117/79, pulse 65, resp. rate 16, height 6\' 1"  (1.854 m), weight 243 lb (110.2 kg), SpO2 94 %.  Vitals with BMI 12/11/2019 11/10/2019 11/10/2019  Height 6\' 1"  - -  Weight 243 lbs - -  BMI 76.19 - -  Systolic 509 326 712  Diastolic 79 72 81  Pulse 65 64 64     Physical Exam Constitutional:      General: He is not in acute distress.    Comments: Well-built and mildly to moderately obese  Cardiovascular:     Rate and Rhythm: Normal rate and regular rhythm.     Pulses: Normal pulses and intact distal pulses.          Carotid pulses are 2+ on the right side with bruit and 2+ on the left side with bruit.    Heart sounds: S1 normal and S2 normal. Murmur heard.  Harsh midsystolic murmur is present with a  grade of 3/6 at the upper right sternal border radiating to the neck.  No gallop.      Comments: There is no leg edema.  No JVD.  Pulmonary:     Effort: Pulmonary effort is normal.     Breath sounds: Normal breath sounds.  Abdominal:     General: Bowel sounds are normal.     Palpations: Abdomen is soft.     Tenderness: There is no rebound.    Laboratory examination:   Recent Labs    11/10/19 1017 11/10/19 1539  NA 136 142  K 4.0 4.3  CL 103  --   CO2 23  --   GLUCOSE 106*  --   BUN 17  --   CREATININE 1.08  --   CALCIUM 8.8*  --   GFRNONAA >60  --   GFRAA >60  --    CrCl cannot be calculated (Patient's most recent lab result is older than the maximum 21  days allowed.).  CMP Latest Ref Rng & Units 11/10/2019 11/10/2019 04/21/2013  Glucose 70 - 99 mg/dL - 106(H) 118(H)  BUN 8 - 23 mg/dL - 17 12  Creatinine 0.61 - 1.24 mg/dL - 1.08 1.02  Sodium 135 - 145 mmol/L 142 136 135  Potassium 3.5 - 5.1 mmol/L 4.3 4.0 3.8  Chloride 98 - 111 mmol/L - 103 104  CO2 22 - 32 mmol/L - 23 22  Calcium 8.9 - 10.3 mg/dL - 8.8(L) 8.5   CBC Latest Ref Rng & Units 11/10/2019 11/10/2019 04/21/2013  WBC 4.0 - 10.5 K/uL - 4.8 8.0  Hemoglobin 13.0 - 17.0 g/dL 12.6(L) 13.5 13.9  Hematocrit 39 - 52 % 37.0(L) 40.0 38.9(L)  Platelets 150 - 400 K/uL - 193 179   Lipid Panel  No results found for: CHOL, TRIG, HDL, CHOLHDL, VLDL, LDLCALC, LDLDIRECT HEMOGLOBIN A1C Lab Results  Component Value Date   HGBA1C 5.8 (H) 04/20/2013   MPG 120 (H) 04/20/2013   TSH No results for input(s): TSH in the last 8760 hours.  External labs:   Cholesterol, total 144.000 M 03/26/2019 HDL 37.000 M 03/26/2019 LDL N/D Triglycerides 164.000 M 03/26/2019  A1C 5.800 % 03/26/2019; TSH 3.100 07/17/2016  Creatinine, Serum 1.490 MG/ 03/26/2019 Potassium 4.200 MM 01/10/2016 ALT (SGPT) 24.000 IU/ 03/26/2019  Hemoglobin 11.900 G/ 03/26/2019; Platelets 213.000 X 03/26/2019 Medications and allergies   Allergies  Allergen Reactions  . Bupropion Other (See Comments)    MADE CHEST HURT  . Naproxen Sodium Other (See Comments)    GI BLEEDING     Current Outpatient Medications  Medication Instructions  . acetaminophen (TYLENOL) 500 mg, Oral, Every 6 hours PRN  . aspirin 81 mg, Oral, 2 times daily  . carvedilol (COREG) 6.25 mg, Oral, 2 times daily with meals  . Dialyvite Vitamin D 5000 10,000 Units, Oral, 2 times daily  . escitalopram (LEXAPRO) 10 mg, Oral, Daily  . Fish Oil 1,200 mg, Oral, Every evening  . losartan (COZAAR) 50 mg, Oral, Daily  . Multiple Vitamins-Minerals (CENTRUM SILVER PO) 1 tablet, Oral, Every evening  . nitroGLYCERIN (NITROSTAT) 0.4 mg, Sublingual, Every 5 min PRN  .  pantoprazole (PROTONIX) 40 mg, Oral, Daily  . polyethylene glycol (MIRALAX / GLYCOLAX) 17 g, Oral, Daily  . rosuvastatin (CRESTOR) 20 MG tablet TAKE 1 TABLET BY MOUTH  DAILY  . sildenafil (VIAGRA) 100 MG tablet TAKE 1 TABLET BY MOUTH AS  NEEDED  . Vitamin B-12 2,500 mcg, Sublingual, Daily    Radiology:   CT Chest  08/13/2019 1. 5 cm dilation of ascending thoracic aorta, likely associated with aortic stenosis due to dense calcifications about the aortic valve. Ascending thoracic aortic aneurysm. Recommend semi-annual imaging followup by CTA or MRA and referral to cardiothoracic surgery if not already obtained. Aortic aneurysm NOS 2. Coronary artery calcifications and signs of previous percutaneous coronary intervention. 3. 4 mm nodule in the left lung base. 4. 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  Cardiac Studies:   Echocardiogram 08/04/2019:  Left ventricle cavity is normal in size. Mild concentric hypertrophy of the left ventricle. Normal LV systolic function with EF 64%. Normal global wall motion. Doppler evidence of grade I (impaired) diastolic dysfunction, normal LAP.  Bicuspid aortic valve with fused left and right coronary cusp. Severe calcification of noncoronary cusp. Moderate aortic stenosis. Trace aortic regurgitation. Aortic valve mean gradient of 25 mmHg, Vmax of 3.2 m/s. Calculated aortic valve area by continuity equation is 1.2 cm.  Mild tricuspid regurgitation. Estimated pulmonary artery systolic pressure is 19 mmHg.  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  Compared to previous study on 06/25/2019, proximal ascending aorta dimension increased from 4.3 cm to 4.5 cm, s/p bicuspid aortopathy.   Coronary angiogram 04/20/2013: Non-ST elevation myocardial infarction 2. S/P Proximal and mid LAD 4.0 x 28 mm stent, proximal and mid circumflex 4.0 x 20 mm Promus Premier drug-eluting stent implantation. Mid LAD stent (09/30/2005 with 3.5 x 13 mmx2 overlapping stents)  patent.  Coronary Angiogram 11/11/2019: LM: Normal LAD: Patent prox LAD stent with 20% late lumen loss at proximal edge LCx: Patent prox stent  [place]: Normal RCA: 95% stenosis in small RV marginal branch, unchanged compared to previous angiogram in 2014  Normal RHC. No pulmonary hypertension.  EKG:  EKG 08/07/2019: Normal sinus rhythm at rate of 72 bpm, left right axis deviation, left posterior fascicular block, right bundle branch block.  Consider RVH/pulmonary disease pattern. Compared to 01/30/2019, right axis deviation is new.   Assessment     ICD-10-CM   1. Bicuspid aortic valve with ascending aorta 5.0 cm in diameter  Q23.1 Ambulatory referral to Cardiothoracic Surgery  2. Atherosclerosis of native coronary artery of native heart without angina pectoris  I25.10   3. OSA on CPAP  G47.33    Z99.89     No orders of the defined types were placed in this encounter.   There are no discontinued medications.  Recommendations:   Devon Sanchez  is a 69 y.o. Caucasian male  with CAD S/P Prox LAD stent in 2007 and again  on 04/20/2013 underwent stenting to the proximal and mid LAD and proximal and mid circumflex. He has bicuspid aortic valve with mild stenosis and mild aortic root dilatation, hypertension, hyperglycemia, hyperlipidemia, OSA on CPAP. 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  I reviewed the results of the recently performed cardiac catheterization, all the stents are widely patent. We discussed whether we should wait till the aorta is 5.5 cm versus proceeding with Bentall procedure with patient having moderate aortic stenosis as well. I would like to get an opinion from Dr. Tharon Aquas Trigt. In view of bicuspid aortopathy, I have a low threshold to proceeding with surgical intervention.  Otherwise he remains stable, no angina,  labs reviewed, I will see him back in 3 months or after surgery. He has been compliant with CPAP.   Adrian Prows, MD, [facility] 12/11/2019, 10:08 AM Office: [phone]

## 2019-12-23 ENCOUNTER — Encounter: Payer: Self-pay | Admitting: Cardiothoracic Surgery

## 2019-12-23 ENCOUNTER — Institutional Professional Consult (permissible substitution): Payer: Medicare Other | Admitting: Cardiothoracic Surgery

## 2019-12-23 ENCOUNTER — Other Ambulatory Visit: Payer: Self-pay

## 2019-12-23 DIAGNOSIS — Q230 Congenital stenosis of aortic valve: Secondary | ICD-10-CM | POA: Diagnosis not present

## 2019-12-23 DIAGNOSIS — I350 Nonrheumatic aortic (valve) stenosis: Secondary | ICD-10-CM | POA: Insufficient documentation

## 2019-12-23 DIAGNOSIS — Q231 Congenital insufficiency of aortic valve: Secondary | ICD-10-CM | POA: Diagnosis not present

## 2019-12-23 NOTE — Progress Notes (Signed)
PCP is Celene Squibb, MD Referring Provider is Adrian Prows, MD  Chief Complaint  Patient presents with  . Aortic Stenosis    with BICUSPID VALVE per ECHO 07/31/19, CATH 11/10/19    HPI: 69 year old hypertensive nondiabetic with known bicuspid aortic valve presents for evaluation treatment of recently diagnosed moderate aortic stenosis by echo with a mean gradient 25 mmHg associated with increasing dyspnea on exertion and fatigue.  The patient also has a documented increase in size of an ascending aortic aneurysm from 4.5 to 5.0 cm.  The patient denies chest pain.  Patient also has history of coronary disease status post an MI with LAD stent in 2009 with restenting in 2014.  Recent cardiac catheterization demonstrates patent LAD stent, no significant other disease other than proximal stenosis of a small nondominant RV marginal branch of the right coronary.  The patient's right heart cath demonstrates CVP of 8 PA pressure 23/8 cardiac output 7 L/min  There is no family history of thoracic or abdominal aortic disease.  No family history of cardiac valve disease or cardiac valve surgery.  The patient first noted to have a murmur at the time of his MI in 2009.  Past Medical History:  Diagnosis Date  . Anxiety   . Aortic root dilatation ([place])   . Atherosclerosis   . Bicuspid aortic valve 07/25/2018  . Depression   . Dizziness and giddiness   . Erectile dysfunction   . Hypercholesterolemia   . Hyperglycemia   . Hyperlipidemia   . Hypertension   . MI (myocardial infarction) ([place])   . Midsystolic murmur   . Reflux     Past Surgical History:  Procedure Laterality Date  . COLONOSCOPY N/A 11/04/2014   Procedure: COLONOSCOPY;  Surgeon: Rogene Houston, MD;  [facility];  Service: Endoscopy;  Laterality: N/A;  830 - moved to 6/2 @ 10:30  . CORONARY ANGIOPLASTY WITH STENT PLACEMENT    . LEFT HEART CATHETERIZATION WITH CORONARY ANGIOGRAM N/A 04/20/2013   Procedure: LEFT HEART  CATHETERIZATION WITH CORONARY ANGIOGRAM;  Surgeon: Laverda Page, MD;  Location: [facility] CATH LAB;  Service: Cardiovascular;  Laterality: N/A;  . RIGHT/LEFT HEART CATH AND CORONARY ANGIOGRAPHY N/A 11/10/2019   Procedure: RIGHT/LEFT HEART CATH AND CORONARY ANGIOGRAPHY;  Surgeon: Nigel Mormon, MD;  Location: [place] CV LAB;  Service: Cardiovascular;  Laterality: N/A;    Family History  Problem Relation Age of Onset  . Failure to thrive Mother   . Diabetes Mother   . Heart attack Father   . Prostate cancer Father     Social History Social History   Tobacco Use  . Smoking status: Never Smoker  . Smokeless tobacco: Never Used  Vaping Use  . Vaping Use: Never used  Substance Use Topics  . Alcohol use: No  . Drug use: No    Current Outpatient Medications  Medication Sig Dispense Refill  . acetaminophen (TYLENOL) 500 MG tablet Take 500 mg by mouth every 6 (six) hours as needed for moderate pain or headache.     Marland Kitchen aspirin 81 MG tablet Take 81 mg by mouth 2 (two) times daily.     . carvedilol (COREG) 6.25 MG tablet Take 1 tablet (6.25 mg total) by mouth 2 (two) times daily with a meal. 180 tablet 1  . Cyanocobalamin (VITAMIN B-12) 2500 MCG SUBL Place 2,500 mcg under the tongue daily.    Marland Kitchen escitalopram (LEXAPRO) 10 MG tablet Take 10 mg by mouth daily.    Marland Kitchen  losartan (COZAAR) 50 MG tablet Take 1 tablet (50 mg total) by mouth daily. (Patient taking differently: Take 50 mg by mouth every evening. ) 90 tablet 3  . Multiple Vitamins-Minerals (CENTRUM SILVER PO) Take 1 tablet by mouth every evening.     . nitroGLYCERIN (NITROSTAT) 0.4 MG SL tablet Place 1 tablet (0.4 mg total) under the tongue every 5 (five) minutes as needed for chest pain (CP or SOB). 30 tablet 12  . Omega-3 Fatty Acids (FISH OIL) 1200 MG CAPS Take 1,200 mg by mouth every evening.    . pantoprazole (PROTONIX) 40 MG tablet Take 40 mg by mouth daily.    . polyethylene glycol (MIRALAX / GLYCOLAX) 17 g packet Take 17 g by  mouth daily.    . rosuvastatin (CRESTOR) 20 MG tablet TAKE 1 TABLET BY MOUTH  DAILY (Patient taking differently: Take 20 mg by mouth daily. ) 90 tablet 3  . sildenafil (VIAGRA) 100 MG tablet TAKE 1 TABLET BY MOUTH AS  NEEDED 12 tablet 2   No current facility-administered medications for this visit.    Allergies  Allergen Reactions  . Bupropion Other (See Comments)    MADE CHEST HURT  . Naproxen Sodium Other (See Comments)    GI BLEEDING    Review of Systems                     Review of Systems :  [ y ] = yes, [  ] = no        General :  Weight gain [   ]    Weight loss  [   ]  Fatigue [  ]  Fever [  ]  Chills  [  ]                                          HEENT    Headache [  ]  Dizziness [  ]  Blurred vision [  ] Glaucoma  [  ]                          Nosebleeds [  ] Painful or loose teeth [  ]        Cardiac :  Chest pain/ pressure [  ]  Resting SOB [  ] exertional SOB [ y ] + fatigue                        Orthopnea [  ]  Pedal edema  [  ]  Palpitations [  ] Syncope/presyncope [ ]                         Paroxysmal nocturnal dyspnea [  ]         Pulmonary : cough [  ]  wheezing [  ]  Hemoptysis [  ] Sputum [  ] Snoring [  ]                              Pneumothorax [  ]  Sleep apnea Blue.Reese  ] uses CPAP       GI : Vomiting [  ]  Dysphagia [  ]  Melena  [  ]  Abdominal pain [  ]  BRBPR [  ]              Heart burn [  ]  Constipation [  ] Diarrhea  [  ] Colonoscopy [   ]        GU : Hematuria [  ]  Dysuria [  ]  Nocturia [  ] UTI's [  ]        Vascular : Claudication [  ]  Rest pain [  ]  DVT [  ] Vein stripping [  ] leg ulcers [  ]                          TIA [  ] Stroke [  ]  Varicose veins [  ]        NEURO :  Headaches  [  ] Seizures [  ] Vision changes [  ] Paresthesias [  ]                                               Musculoskeletal :  Arthritis [  ] Gout  [  ]  Back pain [  ]  Joint pain [  ]        Skin :  Rash [  ]  Melanoma [  ] Sores [  ]        Heme :  Bleeding problems [  ]Clotting Disorders [  ] Anemia [  ]Blood Transfusion [ ]  Bruises easily on fish oil       Endocrine : Diabetes [  ] Heat or Cold intolerance [  ] Polyuria [  ]excessive thirst [ ]         Psych : Depression [  ]  Anxiety Blue.Reese  ]  Psych hospitalizations [  ] Memory change [  ]                                                                            BP 107/68 (BP Location: Right Arm, Patient Position: Sitting, Cuff Size: Normal)   Pulse 81   Temp 97.9 F (36.6 C) (Temporal)   Resp 20   Ht 6\' 1"  (1.854 m)   Wt 251 lb (113.9 kg)   SpO2 95% Comment: RA  BMI 33.12 kg/m  Physical Exam       Exam    General- alert and comfortable, overweight middle-aged male    Neck- no JVD, no cervical adenopathy palpable, no carotid bruit   Lungs- clear without rales, wheezes   Cor- regular rate and rhythm, high-pitched 4/6 aortic stenosis murmur , no  gallop   Abdomen- soft, non-tender   Extremities - warm, non-tender, minimal edema   Neuro- oriented, appropriate, no focal weakness   Diagnostic Tests: Images of cardiac cath, CT scan, and echo report from [place] [facility] personally reviewed and discussed with patient and wife  Impression: Symptomatic moderate aortic stenosis with bicuspid aortic valve and a mean gradient of 25.5 mmHg, area calculated 1.1 cm and valve not crossed at the time  of cardiac catheterization  Enlarging ascending aortic aneurysm from 4.5 up to 5.0 cm, asymptomatic  History of CAD/MI with patent LAD stent   Plan: Patient appears to be appropriate candidate for Bentall procedure to replace the aortic root and graft the ascending aorta.  Would recommend a biologic valve at age 54.  The patient will need his dentist to clear his dental status for valve surgery and he will need to be off his fish oil prior to surgery.  I plan on seeing the patient back in 2 weeks to review the situation and to set a date for surgery.   Len Childs, MD [facility] [phone]

## 2020-01-12 ENCOUNTER — Other Ambulatory Visit: Payer: Self-pay | Admitting: Cardiothoracic Surgery

## 2020-01-12 ENCOUNTER — Encounter: Payer: Self-pay | Admitting: *Deleted

## 2020-01-12 DIAGNOSIS — Q230 Congenital stenosis of aortic valve: Secondary | ICD-10-CM

## 2020-01-13 ENCOUNTER — Other Ambulatory Visit: Payer: Self-pay

## 2020-01-13 ENCOUNTER — Ambulatory Visit
Admission: RE | Admit: 2020-01-13 | Discharge: 2020-01-13 | Disposition: A | Discharge status: Home / Self Care | Payer: Medicare Other | Source: Ambulatory Visit | Attending: Cardiothoracic Surgery | Admitting: Cardiothoracic Surgery

## 2020-01-13 ENCOUNTER — Encounter: Payer: Self-pay | Admitting: Cardiothoracic Surgery

## 2020-01-13 ENCOUNTER — Ambulatory Visit: Payer: Medicare Other | Admitting: Cardiothoracic Surgery

## 2020-01-13 VITALS — BP 117/77 | HR 82 | Temp 97.7°F | Resp 20 | Ht 73.0 in | Wt 251.0 lb

## 2020-01-13 DIAGNOSIS — I7121 Aneurysm of the ascending aorta, without rupture: Secondary | ICD-10-CM

## 2020-01-13 DIAGNOSIS — Q230 Congenital stenosis of aortic valve: Secondary | ICD-10-CM | POA: Diagnosis not present

## 2020-01-13 DIAGNOSIS — Z955 Presence of coronary angioplasty implant and graft: Secondary | ICD-10-CM | POA: Diagnosis not present

## 2020-01-13 DIAGNOSIS — Z01810 Encounter for preprocedural cardiovascular examination: Secondary | ICD-10-CM | POA: Diagnosis not present

## 2020-01-13 DIAGNOSIS — Q231 Congenital insufficiency of aortic valve: Secondary | ICD-10-CM | POA: Diagnosis not present

## 2020-01-13 DIAGNOSIS — K449 Diaphragmatic hernia without obstruction or gangrene: Secondary | ICD-10-CM | POA: Diagnosis not present

## 2020-01-13 DIAGNOSIS — I712 Thoracic aortic aneurysm, without rupture: Secondary | ICD-10-CM

## 2020-01-13 NOTE — Progress Notes (Signed)
PCP is Celene Squibb, MD Referring Provider is Adrian Prows, MD  Chief Complaint  Patient presents with  . Aortic Stenosis    Further discuss scheduling surgery  . Thoracic Aortic Aneurysm    HPI: Patient returns for further discussion of recent diagnosis of moderate aortic stenosis and moderate fusiform aortic aneurysm.  He is having some dyspnea on exertion.  He is 6 foot 1 and weighs 260 pounds.  His coronary disease has been treated with PCI and medications and last catheterization by Dr. Einar Gip shows no significant coronary stenosis. The patient denies any increase in symptoms.  He is able to do normal activities without difficulty.    Past Medical History:  Diagnosis Date  . Anxiety   . Aortic root dilatation ([place])   . Atherosclerosis   . Bicuspid aortic valve 07/25/2018  . Depression   . Dizziness and giddiness   . Erectile dysfunction   . Hypercholesterolemia   . Hyperglycemia   . Hyperlipidemia   . Hypertension   . MI (myocardial infarction) ([place])   . Midsystolic murmur   . Reflux     Past Surgical History:  Procedure Laterality Date  . COLONOSCOPY N/A 11/04/2014   Procedure: COLONOSCOPY;  Surgeon: Rogene Houston, MD;  [facility];  Service: Endoscopy;  Laterality: N/A;  830 - moved to 6/2 @ 10:30  . CORONARY ANGIOPLASTY WITH STENT PLACEMENT    . LEFT HEART CATHETERIZATION WITH CORONARY ANGIOGRAM N/A 04/20/2013   Procedure: LEFT HEART CATHETERIZATION WITH CORONARY ANGIOGRAM;  Surgeon: Laverda Page, MD;  Location: [facility] CATH LAB;  Service: Cardiovascular;  Laterality: N/A;  . RIGHT/LEFT HEART CATH AND CORONARY ANGIOGRAPHY N/A 11/10/2019   Procedure: RIGHT/LEFT HEART CATH AND CORONARY ANGIOGRAPHY;  Surgeon: Nigel Mormon, MD;  Location: [place] CV LAB;  Service: Cardiovascular;  Laterality: N/A;    Family History  Problem Relation Age of Onset  . Failure to thrive Mother   . Diabetes Mother   . Heart attack Father   . Prostate cancer Father      Social History Social History   Tobacco Use  . Smoking status: Never Smoker  . Smokeless tobacco: Never Used  Vaping Use  . Vaping Use: Never used  Substance Use Topics  . Alcohol use: No  . Drug use: No    Current Outpatient Medications  Medication Sig Dispense Refill  . acetaminophen (TYLENOL) 500 MG tablet Take 500 mg by mouth every 6 (six) hours as needed for moderate pain or headache.     Marland Kitchen aspirin 81 MG tablet Take 81 mg by mouth daily.     . carvedilol (COREG) 6.25 MG tablet Take 1 tablet (6.25 mg total) by mouth 2 (two) times daily with a meal. 180 tablet 1  . Cyanocobalamin (VITAMIN B-12) 2500 MCG SUBL Place 2,500 mcg under the tongue daily.    Marland Kitchen escitalopram (LEXAPRO) 10 MG tablet Take 10 mg by mouth daily.    Marland Kitchen losartan (COZAAR) 50 MG tablet Take 1 tablet (50 mg total) by mouth daily. (Patient taking differently: Take 50 mg by mouth every evening. ) 90 tablet 3  . Multiple Vitamins-Minerals (CENTRUM SILVER PO) Take 1 tablet by mouth every evening.     . nitroGLYCERIN (NITROSTAT) 0.4 MG SL tablet Place 1 tablet (0.4 mg total) under the tongue every 5 (five) minutes as needed for chest pain (CP or SOB). 30 tablet 12  . pantoprazole (PROTONIX) 40 MG tablet Take 40 mg by mouth daily.    Marland Kitchen  polyethylene glycol (MIRALAX / GLYCOLAX) 17 g packet Take 17 g by mouth daily.    . rosuvastatin (CRESTOR) 20 MG tablet TAKE 1 TABLET BY MOUTH  DAILY (Patient taking differently: Take 20 mg by mouth daily. ) 90 tablet 3  . sildenafil (VIAGRA) 100 MG tablet TAKE 1 TABLET BY MOUTH AS  NEEDED 12 tablet 2   No current facility-administered medications for this visit.    Allergies  Allergen Reactions  . Bupropion Other (See Comments)    MADE CHEST HURT  . Naproxen Sodium Other (See Comments)    GI BLEEDING    Review of Systems  Weight stable No symptoms of Covid pneumonitis Has been previously fully vaccinated No edema  BP 117/77   Pulse 82   Temp 97.7 F (36.5 C) (Skin)    Resp 20   Ht 6\' 1"  (1.854 m)   Wt 251 lb (113.9 kg)   SpO2 98% Comment: RA  BMI 33.12 kg/m  Physical Exam      Exam    General- alert and comfortable    Neck- no JVD, no cervical adenopathy palpable, no carotid bruit   Lungs- clear without rales, wheezes   Cor- regular rate and rhythm, 2/6 aortic stenosis murmur , no gallop   Abdomen- soft, non-tender   Extremities - warm, non-tender, minimal edema   Neuro- oriented, appropriate, no focal weakness   Diagnostic Tests: No new tests  Impression: Patient is ready for elective AVR with combined replacement of the aortic root and ascending aorta.  The cardiac intensive care unit however is with extremely high acuity and several Covid positive patients are being treated with ECMO and mechanical support.  Nursing staffing is stretched thin.  This is not a optimal time for him to have elective heart surgery during the spike in Covid hospitalizations.  I will talk to the patient with a phone call in 2 weeks to review the situation regarding scheduling surgery.  He understands the issues as outlined above and agrees.  Plan: Phone call visit in 2 weeks to discuss scheduling surgery  Len Childs, MD [facility] [phone]

## 2020-01-27 ENCOUNTER — Telehealth: Payer: Self-pay | Admitting: Cardiothoracic Surgery

## 2020-01-27 ENCOUNTER — Telehealth: Payer: Medicare Other | Admitting: Cardiothoracic Surgery

## 2020-01-27 ENCOUNTER — Other Ambulatory Visit: Payer: Self-pay

## 2020-01-27 NOTE — Telephone Encounter (Signed)
[address]       ,[place] [address]             [phone]     CARDIOTHORACIC SURGERY TELEPHONE VIRTUAL OFFICE NOTE  Referring Provider is No ref. provider found Primary Cardiologist is No primary care provider on file. PCP is Celene Squibb, MD   HPI:  I spoke with Devon Sanchez (DOB Oct 05, 1950 ) via telephone on 01/27/2020 at 3:18 PM and verified that I was speaking with the correct person using more than one form of identification.  We discussed the reason(s) for conducting our visit virtually instead of in-person.  The patient expressed understanding the circumstances and agreed to proceed as described.  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  Currently the hospital has almost 80 COVID-19 patients with several in the CVICU.  The patient has had 2 vaccinations[Pfizer] he would be at risk of exposure to the extremely contagious delta variant as his immune system would be depressed following major aortic root surgery we will hold off on scheduling this procedure which at this point is semielective because of his very stable symptoms.  He would be eligible for a booster vaccine in November 2021.  He denies chest pain shortness of breath edema palpitations presyncope dizziness.  He denies fever cough new shortness of breath or new loss of taste or smell.  Current Outpatient Medications  Medication Sig Dispense Refill  . acetaminophen (TYLENOL) 500 MG tablet Take 500 mg by mouth every 6 (six) hours as needed for moderate pain or headache.     Marland Kitchen aspirin 81 MG tablet Take 81 mg by mouth daily.     . carvedilol (COREG) 6.25 MG tablet Take 1 tablet (6.25 mg total) by mouth 2 (two) times daily with a meal. 180 tablet 1  . Cyanocobalamin (VITAMIN B-12) 2500 MCG SUBL Place  2,500 mcg under the tongue daily.    Marland Kitchen escitalopram (LEXAPRO) 10 MG tablet Take 10 mg by mouth daily.    Marland Kitchen losartan (COZAAR) 50 MG tablet Take 1 tablet (50 mg total) by mouth daily. (Patient taking differently: Take 50 mg by mouth every evening. ) 90 tablet 3  . Multiple Vitamins-Minerals (CENTRUM SILVER PO) Take 1 tablet by mouth every evening.     . nitroGLYCERIN (NITROSTAT) 0.4 MG SL tablet Place 1 tablet (0.4 mg total) under the tongue every 5 (five) minutes as needed for chest pain (CP or SOB). 30 tablet 12  . pantoprazole (PROTONIX) 40 MG tablet Take 40 mg by mouth daily.    . polyethylene glycol (MIRALAX / GLYCOLAX) 17 g packet Take 17 g by mouth daily.    . rosuvastatin (CRESTOR) 20 MG tablet TAKE 1 TABLET BY MOUTH  DAILY (Patient taking differently: Take 20 mg by mouth daily. ) 90 tablet 3  . sildenafil (VIAGRA) 100 MG tablet TAKE 1 TABLET BY MOUTH AS  NEEDED 12 tablet 2   No current facility-administered medications for this visit.     Diagnostic Tests:    Impression:  Patient mains stable with a dilated aortic root, 5.2 cm, with bicuspid aortic valve and 2+ mild AS/AI with no significant coronary disease  Plan:  Only emergency, urgent cardiac surgery meets criteria for hospitalization at this time due to the COVID-19 delta  variant spike and stretched resources in the CVICU.  Patient will contact me if he has any change in symptoms.  I will call the patient in September to review the situation for scheduling surgery.    I discussed limitations of evaluation and management via telephone.  The patient was advised to call back for repeat telephone consultation or to seek an in-person evaluation if questions arise or the patient's clinical condition changes in any significant manner.  I spent in excess of 12 minutes of non-face-to-face time during the conduct of this telephone virtual office consultation.  Level 1  (99441)             5-10 minutes Level 2  (99442)             11-20 minutes Level 3  (99443)            21-30 minutes    01/27/2020 3:18 PM

## 2020-02-15 ENCOUNTER — Ambulatory Visit: Payer: Medicare Other | Admitting: Cardiology

## 2020-02-23 ENCOUNTER — Other Ambulatory Visit: Payer: Self-pay | Admitting: Cardiology

## 2020-02-24 ENCOUNTER — Telehealth: Payer: Medicare Other | Admitting: Cardiothoracic Surgery

## 2020-03-09 ENCOUNTER — Telehealth: Payer: Self-pay | Admitting: Cardiothoracic Surgery

## 2020-03-09 ENCOUNTER — Other Ambulatory Visit: Payer: Self-pay

## 2020-03-09 ENCOUNTER — Telehealth: Payer: Medicare Other | Admitting: Cardiothoracic Surgery

## 2020-03-09 NOTE — Telephone Encounter (Signed)
[address]       [place] [address]             [phone]     CARDIOTHORACIC SURGERY TELEPHONE VIRTUAL OFFICE NOTE  Referring Provider is No ref. provider found Primary Cardiologist is No primary care provider on file. PCP is Celene Squibb, MD   HPI:  I spoke with Devon Sanchez (DOB 1950-08-16 ) via telephone on 03/09/2020 at 3:08 PM and verified that I was speaking with the correct person using more than one form of identification.  We discussed the reason(s) for conducting our visit virtually instead of in-person.  The patient expressed understanding the circumstances and agreed to proceed as described.  I discussed the patient's diagnosis of moderate aortic stenosis with a bicuspid valve and a fusiform ascending aneurysm diameter 4.7 cm with preserved LV function and no significant CAD.  The patient has had stable symptoms without chest tightness presyncope palpitations or orthopnea and mild dyspnea on exertion.  Surgery has been postponed because of COVID-19 risk of hospitalization and shortage of ICU bed and problems with ICU nursing staffing.  The Covid spike in September is now receding.  The patient is considering a Covid booster and an influenza vaccine before surgery.  I think this is a good idea.  He will be coming for a in person office visit in early November to discuss a date for surgery.  He will let us know if he has any change in symptoms.  He knows importance of compliance with medications and keeping his blood pressure checked with a goal of keeping the top number less than 283 systolic   Current Outpatient Medications  Medication Sig Dispense Refill  . acetaminophen (TYLENOL) 500 MG tablet Take 500 mg by mouth every 6 (six) hours as needed for moderate pain or headache.     Marland Kitchen aspirin 81 MG tablet Take 81 mg by mouth daily.     . carvedilol (COREG) 6.25 MG tablet Take 1 tablet (6.25 mg total) by mouth 2 (two) times daily with a meal. 180 tablet 1   . Cyanocobalamin (VITAMIN B-12) 2500 MCG SUBL Place 2,500 mcg under the tongue daily.    Marland Kitchen escitalopram (LEXAPRO) 10 MG tablet Take 10 mg by mouth daily.    Marland Kitchen losartan (COZAAR) 50 MG tablet Take 1 tablet (50 mg total) by mouth daily. (Patient taking differently: Take 50 mg by mouth every evening. ) 90 tablet 3  . Multiple Vitamins-Minerals (CENTRUM SILVER PO) Take 1 tablet by mouth every evening.     . nitroGLYCERIN (NITROSTAT) 0.4 MG SL tablet Place 1 tablet (0.4 mg total) under the tongue every 5 (five) minutes as needed for chest pain (CP or SOB). 30 tablet 12  . pantoprazole (PROTONIX) 40 MG tablet Take 40 mg by mouth daily.    . polyethylene glycol (MIRALAX / GLYCOLAX) 17 g packet Take 17 g by mouth daily.    . rosuvastatin (CRESTOR) 20 MG tablet TAKE 1 TABLET BY MOUTH  DAILY 90 tablet 3  . sildenafil (VIAGRA) 100 MG tablet TAKE 1 TABLET BY MOUTH AS  NEEDED 12 tablet 2   No current facility-administered medications for this visit.     Diagnostic Tests:     Impression:  Moderate aortic stenosis, bicuspid aortic valve, 4.8-5.0 cm fusiform ascending aneurysm  Plan:  Office visit in early November to discuss surgery Patient will consider obtaining a COVID-19 booster shot soon.    I discussed  limitations of evaluation and management via telephone.  The patient was advised to call back for repeat telephone consultation or to seek an in-person evaluation if questions arise or the patient's clinical condition changes in any significant manner.  I spent in excess of 5 minutes of non-face-to-face time during the conduct of this telephone virtual office consultation.  Level 1  (99441)             5-10 minutes Level 2  (99442)            11-20 minutes Level 3  (99443)            21-30 minutes    03/09/2020 3:08 PM

## 2020-03-14 ENCOUNTER — Ambulatory Visit: Payer: Medicare Other | Admitting: Cardiology

## 2020-03-24 ENCOUNTER — Other Ambulatory Visit: Payer: Self-pay

## 2020-03-24 ENCOUNTER — Ambulatory Visit: Payer: Medicare Other | Admitting: Cardiology

## 2020-03-24 ENCOUNTER — Encounter: Payer: Self-pay | Admitting: Cardiology

## 2020-03-24 VITALS — BP 109/81 | HR 79 | Ht 73.0 in | Wt 246.8 lb

## 2020-03-24 DIAGNOSIS — I712 Thoracic aortic aneurysm, without rupture, unspecified: Secondary | ICD-10-CM

## 2020-03-24 DIAGNOSIS — I2510 Atherosclerotic heart disease of native coronary artery without angina pectoris: Secondary | ICD-10-CM

## 2020-03-24 DIAGNOSIS — E782 Mixed hyperlipidemia: Secondary | ICD-10-CM

## 2020-03-24 DIAGNOSIS — I10 Essential (primary) hypertension: Secondary | ICD-10-CM | POA: Diagnosis not present

## 2020-03-24 DIAGNOSIS — I6522 Occlusion and stenosis of left carotid artery: Secondary | ICD-10-CM

## 2020-03-24 DIAGNOSIS — Q231 Congenital insufficiency of aortic valve: Secondary | ICD-10-CM

## 2020-03-24 NOTE — Progress Notes (Signed)
Primary Physician/Referring:  Celene Squibb, MD  Patient ID: MONG NEAL, male    DOB: [birth year], 69 y.o.   MRN: [id number]  Chief Complaint  Patient presents with  . Coronary Artery Disease  . Aortic Aneurysum  . Aortic Stenosis  . Follow-up    3 months   HPI:    ELEAZAR KIMMEY  is a 69 y.o. Caucasian male  with CAD S/P Prox LAD stent in 2007 and again  on 04/20/2013 underwent stenting to the proximal and mid LAD and proximal and mid circumflex. 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    Essentially asymptomatic from cardiac standpoint.  He is awaiting surgical date for aortic aneurysm repair.  He has not had any chest pain or dyspnea.  Past Medical History:  Diagnosis Date  . Anxiety   . Aortic root dilatation ([place])   . Atherosclerosis   . Bicuspid aortic valve 07/25/2018  . Depression   . Dizziness and giddiness   . Erectile dysfunction   . Hypercholesterolemia   . Hyperglycemia   . Hyperlipidemia   . Hypertension   . MI (myocardial infarction) ([place])   . Midsystolic murmur   . Reflux    Past Surgical History:  Procedure Laterality Date  . COLONOSCOPY N/A 11/04/2014   Procedure: COLONOSCOPY;  Surgeon: Rogene Houston, MD;  Location: [facility];  Service: Endoscopy;  Laterality: N/A;  830 - moved to 6/2 @ 10:30  . CORONARY ANGIOPLASTY WITH STENT PLACEMENT    . LEFT HEART CATHETERIZATION WITH CORONARY ANGIOGRAM N/A 04/20/2013   Procedure: LEFT HEART CATHETERIZATION WITH CORONARY ANGIOGRAM;  Surgeon: Laverda Page, MD;  Location: [facility] CATH LAB;  Service: Cardiovascular;  Laterality: N/A;  . RIGHT/LEFT HEART CATH AND CORONARY ANGIOGRAPHY N/A 11/10/2019   Procedure: RIGHT/LEFT HEART CATH AND CORONARY ANGIOGRAPHY;  Surgeon: Nigel Mormon, MD;  Location: [facility] CV LAB;  Service:  Cardiovascular;  Laterality: N/A;   Family History  Problem Relation Age of Onset  . Failure to thrive Mother   . Diabetes Mother   . Heart attack Father   . Prostate cancer Father     Social History   Tobacco Use  . Smoking status: Never Smoker  . Smokeless tobacco: Never Used  Substance Use Topics  . Alcohol use: No   ROS  Review of Systems  Cardiovascular: Negative for chest pain, dyspnea on exertion and leg swelling.  Gastrointestinal: Negative for melena.  Psychiatric/Behavioral: Positive for depression.   Objective  Blood pressure 109/81, pulse 79, height 6\' 1"  (1.854 m), weight 246 lb 12.8 oz (111.9 kg), SpO2 98 %.  Vitals with BMI 03/24/2020 01/13/2020 12/23/2019  Height 6\' 1"  6\' 1"  6\' 1"   Weight 246 lbs 13 oz 251 lbs 251 lbs  BMI 32.57 94.80 16.55  Systolic 374 827 078  Diastolic 81 77 68  Pulse 79 82 81     Physical Exam Constitutional:      General: He is not in acute distress.    Comments: Well-built and mildly to moderately obese  Cardiovascular:     Rate and Rhythm: Normal rate and regular rhythm.     Pulses: Normal pulses and intact distal pulses.          Carotid pulses are 2+ on the right side with bruit and 2+ on the left  side with bruit.    Heart sounds: S1 normal and S2 normal. Murmur heard.  Harsh midsystolic murmur is present with a grade of 3/6 at the upper right sternal border radiating to the neck.  No gallop.      Comments: There is no leg edema.  No JVD.  Pulmonary:     Effort: Pulmonary effort is normal.     Breath sounds: Normal breath sounds.  Abdominal:     General: Bowel sounds are normal.     Palpations: Abdomen is soft.     Tenderness: There is no rebound.    Laboratory examination:   Recent Labs    11/10/19 1017 11/10/19 1539  NA 136 142  K 4.0 4.3  CL 103  --   CO2 23  --   GLUCOSE 106*  --   BUN 17  --   CREATININE 1.08  --   CALCIUM 8.8*  --   GFRNONAA >60  --   GFRAA >60  --    CrCl cannot be calculated  (Patient's most recent lab result is older than the maximum 21 days allowed.).  CMP Latest Ref Rng & Units 11/10/2019 11/10/2019 04/21/2013  Glucose 70 - 99 mg/dL - 106(H) 118(H)  BUN 8 - 23 mg/dL - 17 12  Creatinine 0.61 - 1.24 mg/dL - 1.08 1.02  Sodium 135 - 145 mmol/L 142 136 135  Potassium 3.5 - 5.1 mmol/L 4.3 4.0 3.8  Chloride 98 - 111 mmol/L - 103 104  CO2 22 - 32 mmol/L - 23 22  Calcium 8.9 - 10.3 mg/dL - 8.8(L) 8.5   CBC Latest Ref Rng & Units 11/10/2019 11/10/2019 04/21/2013  WBC 4.0 - 10.5 K/uL - 4.8 8.0  Hemoglobin 13.0 - 17.0 g/dL 12.6(L) 13.5 13.9  Hematocrit 39 - 52 % 37.0(L) 40.0 38.9(L)  Platelets 150 - 400 K/uL - 193 179   External labs:   Cholesterol, total 144.000 M 03/26/2019 HDL 37.000 MG 03/26/2019 LDL-C 70.000 MG 09/22/2018 Triglycerides 164.000 M 03/26/2019  A1C 5.800 % 03/26/2019; TSH 3.100 07/17/2016  Creatinine, Serum 1.490 MG/ 03/26/2019 Potassium 4.200 MM 01/10/2016 ALT (SGPT) 24.000 IU/ 03/26/2019  Hemoglobin 11.900 G/ 03/26/2019; Platelets 213.000 X 03/26/2019 Medications and allergies   Allergies  Allergen Reactions  . Bupropion Other (See Comments)    MADE CHEST HURT  . Naproxen Sodium Other (See Comments)    GI BLEEDING    Current Outpatient Medications on File Prior to Visit  Medication Sig Dispense Refill  . acetaminophen (TYLENOL) 500 MG tablet Take 500 mg by mouth every 6 (six) hours as needed for moderate pain or headache.     Marland Kitchen aspirin 81 MG tablet Take 81 mg by mouth daily.     . carvedilol (COREG) 6.25 MG tablet Take 1 tablet (6.25 mg total) by mouth 2 (two) times daily with a meal. 180 tablet 1  . escitalopram (LEXAPRO) 10 MG tablet Take 10 mg by mouth daily.    Marland Kitchen losartan (COZAAR) 50 MG tablet Take 1 tablet (50 mg total) by mouth daily. (Patient taking differently: Take 50 mg by mouth every evening. ) 90 tablet 3  . Multiple Vitamins-Minerals (CENTRUM SILVER PO) Take 1 tablet by mouth every evening.     . nitroGLYCERIN (NITROSTAT) 0.4 MG  SL tablet Place 1 tablet (0.4 mg total) under the tongue every 5 (five) minutes as needed for chest pain (CP or SOB). 30 tablet 12  . pantoprazole (PROTONIX) 40 MG tablet Take 40 mg by mouth daily.    Marland Kitchen  polyethylene glycol (MIRALAX / GLYCOLAX) 17 g packet Take 17 g by mouth daily.    . rosuvastatin (CRESTOR) 20 MG tablet TAKE 1 TABLET BY MOUTH  DAILY 90 tablet 3  . sildenafil (VIAGRA) 100 MG tablet TAKE 1 TABLET BY MOUTH AS  NEEDED 12 tablet 2   No current facility-administered medications on file prior to visit.   Radiology:   CT Chest 08/13/2019 1. A 5.0 cm dilation of ascending thoracic aorta, likely associated with aortic stenosis due to dense calcifications about the aortic valve. Ascending thoracic aortic aneurysm. Recommend semi-annual imaging followup by CTA or MRA and referral to cardiothoracic surgery if not already obtained.   2. Coronary artery calcifications and signs of previous percutaneous coronary intervention. 3. 4 mm nodule in the left lung base. 4. 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  Cardiac Studies:   Echocardiogram 08/04/2019:  Left ventricle cavity is normal in size. Mild concentric hypertrophy of the left ventricle. Normal LV systolic function with EF 64%. Normal global wall motion. Doppler evidence of grade I (impaired) diastolic dysfunction, normal LAP.  Bicuspid aortic valve with fused left and right coronary cusp. Severe calcification of noncoronary cusp. Moderate aortic stenosis. Trace aortic regurgitation. Aortic valve mean gradient of 25 mmHg, Vmax of 3.2 m/s. Calculated aortic valve area by continuity equation is 1.2 cm.  Mild tricuspid regurgitation. Estimated pulmonary artery systolic pressure is 19 mmHg.  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  Compared to previous study on 06/25/2019, proximal ascending aorta dimension increased from 4.3 cm to 4.5 cm, s/p bicuspid aortopathy.   Coronary angiogram 04/20/2013: Non-ST elevation  myocardial infarction 2. S/P Proximal and mid LAD 4.0 x 28 mm stent, proximal and mid circumflex 4.0 x 20 mm Promus Premier drug-eluting stent implantation. Mid LAD stent (09/30/2005 with 3.5 x 13 mmx2 overlapping stents) patent.  Coronary Angiogram 11/11/2019: LM: Normal LAD: Patent prox LAD stent with 20% late lumen loss at proximal edge LCx: Patent prox stent  [place]: Normal RCA: 95% stenosis in small RV marginal branch, unchanged compared to previous angiogram in 2014  Normal RHC. No pulmonary hypertension.  Carotid artery duplex  08/19/2019: Duplex suggests stenosis in the right internal carotid artery (1-15%). Duplex suggests stenosis in the left internal carotid artery (16-49%). Antegrade right vertebral artery flow. Antegrade left vertebral artery flow. Follow up in one year is appropriate if clinically indicated. Compared to 05/31/2015, left ICA stenosis new.  EKG:    EKG 03/24/2020: Normal sinus rhythm at rate of 70 bpm, rightward axis, RVH (RBBB).  Poor R wave progression, cannot exclude anteroseptal infarct old.  Low-voltage complexes.  Pulmonary disease pattern.  Nonspecific T abnormality. No significant change from EKG 08/07/2019   Assessment     ICD-10-CM   1. Bicuspid aortic valve with ascending aorta 4.0 to 4.5 cm in diameter  Q23.1 EKG 12-Lead  2. Thoracic aortic aneurysm without rupture (HCC)  I71.2   3. Atherosclerosis of native coronary artery of native heart without angina pectoris  I25.10   4. Essential hypertension  I10   5. Mixed hyperlipidemia  E78.2   6. Asymptomatic stenosis of left carotid artery  I65.22     No orders of the defined types were placed in this encounter.   Medications Discontinued During This Encounter  Medication Reason  . Cyanocobalamin (VITAMIN B-12) 2500 MCG SUBL Patient Preference  . omeprazole (PRILOSEC) 20 MG capsule Change in therapy    Recommendations:   SKEETER SHEARD  is a 69 y.o. Caucasian  male  with CAD S/P Prox LAD  stent in 2007 and again  on 04/20/2013 underwent stenting to the proximal and mid LAD and proximal and mid circumflex. 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   He has now been evaluated by Dr. Dahlia Byes and has an upcoming appointment to schedule surgery.  Blood pressure is well controlled, otherwise he remains stable, no angina,  labs reviewed, lipids are under excellent control, he has mild carotid disease and has surveillance Doppler scheduled in 6 months.  I will see him back in 2 months or after surgery. He has been compliant with CPAP.   Adrian Prows, MD, [facility] 03/24/2020, 11:24 AM Office: [phone]

## 2020-04-05 ENCOUNTER — Other Ambulatory Visit: Payer: Self-pay | Admitting: Cardiothoracic Surgery

## 2020-04-05 DIAGNOSIS — Q231 Congenital insufficiency of aortic valve: Secondary | ICD-10-CM

## 2020-04-06 ENCOUNTER — Encounter: Payer: Self-pay | Admitting: Cardiothoracic Surgery

## 2020-04-06 ENCOUNTER — Other Ambulatory Visit: Payer: Self-pay

## 2020-04-06 ENCOUNTER — Other Ambulatory Visit: Payer: Self-pay | Admitting: Cardiothoracic Surgery

## 2020-04-06 ENCOUNTER — Ambulatory Visit: Payer: Medicare Other | Admitting: Cardiothoracic Surgery

## 2020-04-06 ENCOUNTER — Telehealth (HOSPITAL_COMMUNITY): Payer: Self-pay | Admitting: Emergency Medicine

## 2020-04-06 ENCOUNTER — Ambulatory Visit
Admission: RE | Admit: 2020-04-06 | Discharge: 2020-04-06 | Disposition: A | Discharge status: Home / Self Care | Payer: Medicare Other | Source: Ambulatory Visit | Attending: Cardiothoracic Surgery | Admitting: Cardiothoracic Surgery

## 2020-04-06 VITALS — BP 127/89 | HR 66 | Temp 97.7°F | Resp 20 | Ht 73.0 in | Wt 249.0 lb

## 2020-04-06 DIAGNOSIS — R0602 Shortness of breath: Secondary | ICD-10-CM | POA: Diagnosis not present

## 2020-04-06 DIAGNOSIS — I7121 Aneurysm of the ascending aorta, without rupture: Secondary | ICD-10-CM

## 2020-04-06 DIAGNOSIS — Q231 Congenital insufficiency of aortic valve: Secondary | ICD-10-CM

## 2020-04-06 DIAGNOSIS — Q230 Congenital stenosis of aortic valve: Secondary | ICD-10-CM

## 2020-04-06 DIAGNOSIS — I712 Thoracic aortic aneurysm, without rupture: Secondary | ICD-10-CM | POA: Diagnosis not present

## 2020-04-06 NOTE — Telephone Encounter (Signed)
Reaching out to patient to offer assistance regarding upcoming cardiac imaging study; pt verbalizes understanding of appt date/time, parking situation and where to check in, pre-test NPO status and medications ordered, and verified current allergies; name and call back number provided for further questions should they arise Shirlee Whitmire RN Navigator Cardiac Imaging [facility] [phone] office [phone] cell 

## 2020-04-06 NOTE — Progress Notes (Signed)
PCP is Celene Squibb, MD Referring Provider is Adrian Prows, MD  Chief Complaint  Patient presents with  . 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  He returns for discussion of surgery for his bicuspid aortic stenosis and ascending thoracic aneurysm.  He remains asymptomatic but does some shortness of breath with exercise.  He had a cardiac catheterization in June 2021 and an echocardiogram in February 2021.  LV function reported as mildly reduced.  Aortic stenosis estimated at moderate.  Stents in the proximal LAD and circumflex were patent however the flow through the proximal LAD appears to be somewhat reduced.  The patient recently had his Covid booster shot as well as an influenza and he feels comfortable now with admission to the hospital for cardiac surgery.  He denies angina weight change cough fever difficulty swallowing.  He had dental exam within the past 6 months that showed no active dental disease.  He stopped taking fish oil tablets several months ago.  The patient meets criteria for AVR and replacement of ascending aorta.  Prior to surgery I will schedule a gated cardiac CT scan to document there is adequate flow through the proximal LAD stent and for a current assessment of the status of his aortic root since it has been 8 months since his CTA.  Past Medical History:  Diagnosis Date  . Anxiety   . 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 Atherosclerosis   . Bicuspid aortic valve 07/25/2018  . Depression   . Dizziness and giddiness   . Erectile dysfunction   . Hypercholesterolemia   . Hyperglycemia   . Hyperlipidemia   . Hypertension   . MI (myocardial infarction) ([place])   . Midsystolic murmur   . Reflux     Past Surgical History:  Procedure Laterality Date  . COLONOSCOPY  N/A 11/04/2014   Procedure: COLONOSCOPY;  Surgeon: Rogene Houston, MD;  Location: [facility];  Service: Endoscopy;  Laterality: N/A;  830 - moved to 6/2 @ 10:30  . CORONARY ANGIOPLASTY WITH STENT PLACEMENT    . LEFT HEART CATHETERIZATION WITH CORONARY ANGIOGRAM N/A 04/20/2013   Procedure: LEFT HEART CATHETERIZATION WITH CORONARY ANGIOGRAM;  Surgeon: Laverda Page, MD;  Location: [facility] CATH LAB;  Service: Cardiovascular;  Laterality: N/A;  . RIGHT/LEFT HEART CATH AND CORONARY ANGIOGRAPHY N/A 11/10/2019   Procedure: RIGHT/LEFT HEART CATH AND CORONARY ANGIOGRAPHY;  Surgeon: Nigel Mormon, MD;  Location: [facility] LAB;  Service: Cardiovascular;  Laterality: N/A;    Family History  Problem Relation Age of Onset  . Failure to thrive Mother   . Diabetes Mother   . Heart attack Father   . Prostate cancer Father     Social History Social History   Tobacco Use  . Smoking status: Never Smoker  . Smokeless tobacco: Never Used  Vaping Use  . Vaping Use: Never used  Substance Use Topics  . Alcohol use: No  . Drug use: No    Current Outpatient Medications  Medication Sig Dispense Refill  . acetaminophen (TYLENOL) 500 MG tablet Take 500 mg by mouth every 6 (six) hours as needed for moderate pain or headache.     Marland Kitchen aspirin 81 MG tablet Take 81 mg by mouth in the morning and at bedtime.     Marland Kitchen  carvedilol (COREG) 6.25 MG tablet Take 1 tablet (6.25 mg total) by mouth 2 (two) times daily with a meal. 180 tablet 1  . escitalopram (LEXAPRO) 10 MG tablet Take 10 mg by mouth daily.    Marland Kitchen losartan (COZAAR) 50 MG tablet Take 1 tablet (50 mg total) by mouth daily. (Patient taking differently: Take 50 mg by mouth every evening. ) 90 tablet 3  . Multiple Vitamins-Minerals (CENTRUM SILVER PO) Take 1 tablet by mouth every evening.     . nitroGLYCERIN (NITROSTAT) 0.4 MG SL tablet Place 1 tablet (0.4 mg total) under the tongue every 5 (five) minutes as needed for chest pain (CP or SOB). 30 tablet 12   . pantoprazole (PROTONIX) 40 MG tablet Take 40 mg by mouth daily.    . polyethylene glycol (MIRALAX / GLYCOLAX) 17 g packet Take 17 g by mouth daily.    . rosuvastatin (CRESTOR) 20 MG tablet TAKE 1 TABLET BY MOUTH  DAILY 90 tablet 3  . sildenafil (VIAGRA) 100 MG tablet TAKE 1 TABLET BY MOUTH AS  NEEDED 12 tablet 2   No current facility-administered medications for this visit.    Allergies  Allergen Reactions  . Bupropion Other (See Comments)    MADE CHEST HURT  . Naproxen Sodium Other (See Comments)    GI BLEEDING    Review of Systems  No symptoms of chest pain shortness of breath productive cough reduction in taste or smell No headache change in vision presyncope dizziness Dental complaints difficulty swallowing sore throat No falls no bruising no difficulty walking No change in Appetite or bowel habits No hematuria nocturia polyuria Lower extremity edema claudication No bleeding problems Right-hand-dominant BP 127/89   Pulse 66   Temp 97.7 F (36.5 C) (Skin)   Resp 20   Ht 6\' 1"  (1.854 m)   Wt 249 lb (112.9 kg)   SpO2 93% Comment: RA  BMI 32.85 kg/m       Exam    General- alert and comfortable    Neck- no JVD, no cervical adenopathy palpable, no carotid bruit   Lungs- clear without rales, wheezes   Cor- regular rate and rhythm, 2/6 SEM of aortic stenosis, no gallop present   Abdomen- soft, non-tender   Extremities - warm, non-tender, minimal edema   Neuro- oriented, appropriate, no focal weakness   Diagnostic Tests: Most recent coronary arteriogram and CTA personally reviewed.  Most recent echo report February 2021 in [place] heart viewed.  Impression: Patient meets criteria for AVR and replacement of ascending aorta.  His LV function appears adequate.  The patient has had previous coronary PCI for severe calcific coronary disease.  These are several years old.  To confirm that the flow through the LAD stent is adequate prior to surgery he will be scheduled  for a gated cardiac CT scan.  Plan: Return after gated cardiac CT scan to schedule date for surgery and discuss the results of the scan.   Len Childs, MD [facility] [phone]

## 2020-04-07 DIAGNOSIS — Q230 Congenital stenosis of aortic valve: Secondary | ICD-10-CM | POA: Diagnosis not present

## 2020-04-07 DIAGNOSIS — Q231 Congenital insufficiency of aortic valve: Secondary | ICD-10-CM | POA: Diagnosis not present

## 2020-04-07 LAB — BASIC METABOLIC PANEL
BUN/Creatinine Ratio: 28 (calc) — ABNORMAL HIGH (ref 6–22)
BUN: 30 mg/dL — ABNORMAL HIGH (ref 7–25)
CO2: 24 mmol/L (ref 20–32)
Calcium: 9.5 mg/dL (ref 8.6–10.3)
Chloride: 105 mmol/L (ref 98–110)
Creat: 1.07 mg/dL (ref 0.70–1.25)
Glucose, Bld: 109 mg/dL (ref 65–139)
Potassium: 4.6 mmol/L (ref 3.5–5.3)
Sodium: 137 mmol/L (ref 135–146)

## 2020-04-08 ENCOUNTER — Encounter (HOSPITAL_COMMUNITY): Payer: Self-pay

## 2020-04-08 ENCOUNTER — Other Ambulatory Visit: Payer: Self-pay

## 2020-04-08 ENCOUNTER — Ambulatory Visit (HOSPITAL_COMMUNITY)
Admission: RE | Admit: 2020-04-08 | Discharge: 2020-04-08 | Disposition: A | Discharge status: Home / Self Care | Payer: Medicare Other | Source: Ambulatory Visit | Attending: Cardiothoracic Surgery | Admitting: Cardiothoracic Surgery

## 2020-04-08 DIAGNOSIS — I350 Nonrheumatic aortic (valve) stenosis: Secondary | ICD-10-CM | POA: Diagnosis not present

## 2020-04-08 DIAGNOSIS — Q230 Congenital stenosis of aortic valve: Secondary | ICD-10-CM | POA: Diagnosis not present

## 2020-04-08 DIAGNOSIS — Q231 Congenital insufficiency of aortic valve: Secondary | ICD-10-CM | POA: Diagnosis not present

## 2020-04-08 MED ORDER — ACETAMINOPHEN 325 MG PO TABS
650.0000 mg | ORAL_TABLET | Freq: Once | ORAL | Status: AC
  Administered 2020-04-08: 650 mg via ORAL
  Filled 2020-04-08: qty 2

## 2020-04-08 MED ORDER — NITROGLYCERIN 0.4 MG SL SUBL: 0.8000 mg | SUBLINGUAL_TABLET | Freq: Once | SUBLINGUAL | Status: AC

## 2020-04-08 MED ORDER — ACETAMINOPHEN 325 MG PO TABS
ORAL_TABLET | ORAL | Status: AC
  Filled 2020-04-08: qty 2

## 2020-04-08 MED ORDER — METOPROLOL TARTRATE 5 MG/5ML IV SOLN: 10.0000 mg | INTRAVENOUS | Status: DC | PRN

## 2020-04-08 MED ORDER — IOHEXOL 350 MG/ML SOLN
100.0000 mL | Freq: Once | INTRAVENOUS | Status: AC | PRN
  Administered 2020-04-08: 100 mL via INTRAVENOUS

## 2020-04-08 MED ORDER — METOPROLOL TARTRATE 5 MG/5ML IV SOLN
INTRAVENOUS | Status: AC
  Filled 2020-04-08: qty 10

## 2020-04-08 MED ORDER — NITROGLYCERIN 0.4 MG SL SUBL
SUBLINGUAL_TABLET | SUBLINGUAL | Status: AC
  Administered 2020-04-08: 0.8 mg via SUBLINGUAL
  Filled 2020-04-08: qty 2

## 2020-04-08 NOTE — Discharge Instructions (Signed)
Cardiac CT Angiogram A cardiac CT angiogram is a procedure to look at the heart and the area around the heart. It may be done to help find the cause of chest pains or other symptoms of heart disease. During this procedure, a substance called contrast dye is injected into the blood vessels in the area to be checked. A large X-ray machine, called a CT scanner, then takes detailed pictures of the heart and the surrounding area. The procedure is also sometimes called a coronary CT angiogram, coronary artery scanning, or CTA. A cardiac CT angiogram allows the health care provider to see how well blood is flowing to and from the heart. The health care provider will be able to see if there are any problems, such as:  Blockage or narrowing of the coronary arteries in the heart.  Fluid around the heart.  Signs of weakness or disease in the muscles, valves, and tissues of the heart. Tell a health care provider about:  Any allergies you have. This is especially important if you have had a previous allergic reaction to contrast dye.  All medicines you are taking, including vitamins, herbs, eye drops, creams, and over-the-counter medicines.  Any blood disorders you have.  Any surgeries you have had.  Any medical conditions you have.  Whether you are pregnant or may be pregnant.  Any anxiety disorders, chronic pain, or other conditions you have that may increase your stress or prevent you from lying still. What are the risks? Generally, this is a safe procedure. However, problems may occur, including:  Bleeding.  Infection.  Allergic reactions to medicines or dyes.  Damage to other structures or organs.  Kidney damage from the contrast dye that is used.  Increased risk of cancer from radiation exposure. This risk is low. Talk with your health care provider about: ? The risks and benefits of testing. ? How you can receive the lowest dose of radiation. What happens before the  procedure?  Wear comfortable clothing and remove any jewelry, glasses, dentures, and hearing aids.  Follow instructions from your health care provider about eating and drinking. This may include: ? For 12 hours before the procedure -- avoid caffeine. This includes tea, coffee, soda, energy drinks, and diet pills. Drink plenty of water or other fluids that do not have caffeine in them. Being well hydrated can prevent complications. ? For 4-6 hours before the procedure -- stop eating and drinking. The contrast dye can cause nausea, but this is less likely if your stomach is empty.  Ask your health care provider about changing or stopping your regular medicines. This is especially important if you are taking diabetes medicines, blood thinners, or medicines to treat problems with erections (erectile dysfunction). What happens during the procedure?   Hair on your chest may need to be removed so that small sticky patches called electrodes can be placed on your chest. These will transmit information that helps to monitor your heart during the procedure.  An IV will be inserted into one of your veins.  You might be given a medicine to control your heart rate during the procedure. This will help to ensure that good images are obtained.  You will be asked to lie on an exam table. This table will slide in and out of the CT machine during the procedure.  Contrast dye will be injected into the IV. You might feel warm, or you may get a metallic taste in your mouth.  You will be given a medicine called   nitroglycerin. This will relax or dilate the arteries in your heart.  The table that you are lying on will move into the CT machine tunnel for the scan.  The person running the machine will give you instructions while the scans are being done. You may be asked to: ? Keep your arms above your head. ? Hold your breath. ? Stay very still, even if the table is moving.  When the scanning is complete, you  will be moved out of the machine.  The IV will be removed. The procedure may vary among health care providers and hospitals. What can I expect after the procedure? After your procedure, it is common to have:  A metallic taste in your mouth from the contrast dye.  A feeling of warmth.  A headache from the nitroglycerin. Follow these instructions at home:  Take over-the-counter and prescription medicines only as told by your health care provider.  If you are told, drink enough fluid to keep your urine pale yellow. This will help to flush the contrast dye out of your body.  Most people can return to their normal activities right after the procedure. Ask your health care provider what activities are safe for you.  It is up to you to get the results of your procedure. Ask your health care provider, or the department that is doing the procedure, when your results will be ready.  Keep all follow-up visits as told by your health care provider. This is important. Contact a health care provider if:  You have any symptoms of allergy to the contrast dye. These include: ? Shortness of breath. ? Rash or hives. ? A racing heartbeat. Summary  A cardiac CT angiogram is a procedure to look at the heart and the area around the heart. It may be done to help find the cause of chest pains or other symptoms of heart disease.  During this procedure, a large X-ray machine, called a CT scanner, takes detailed pictures of the heart and the surrounding area after a contrast dye has been injected into blood vessels in the area.  Ask your health care provider about changing or stopping your regular medicines before the procedure. This is especially important if you are taking diabetes medicines, blood thinners, or medicines to treat erectile dysfunction.  If you are told, drink enough fluid to keep your urine pale yellow. This will help to flush the contrast dye out of your body. This information is not  intended to replace advice given to you by your health care provider. Make sure you discuss any questions you have with your health care provider. Document Revised: 01/14/2019 Document Reviewed: 01/14/2019 Elsevier Patient Education  2020 Else[employer] Testing With IV Contrast Material IV contrast material is a fluid that is used with some imaging tests. It is injected into your body through a vein. Contrast material is used when your health care providers need a detailed look at organs, tissues, or blood vessels that may not show up with the standard test. The material may be used when an X-ray, an MRI, a CT scan, or an ultrasound is done. IV contrast material may be used for imaging tests that check:  Muscles, skin, and fat.  Breasts.  Brain.  Digestive tract.  Heart.  Organs such as the liver, kidneys, lungs, bladder, and many others.  Arteries and veins. Tell a health care provider about:  Any allergies you have, especially an allergy to contrast material.  All medicines you are taking, including   metformin, beta blockers, NSAIDs (such as ibuprofen), interleukin-2, vitamins, herbs, eye drops, creams, and over-the-counter medicines.  Any problems you or family members have had with the use of contrast material.  Any blood disorders you have, such as sickle cell anemia.  Any surgeries you have had.  Any medical conditions you have or have had, especially alcohol abuse, dehydration, asthma, or kidney, liver, or heart problems.  Whether you are pregnant or may be pregnant.  Whether you are breastfeeding. Most contrast materials are safe for use in breastfeeding women. What are the risks? Generally, this is a safe procedure. However, problems may occur, including:  Headache.  Itching, skin rash, and hives.  Nausea and vomiting.  Allergic reactions.  Wheezing or difficulty breathing.  Abnormal heart rate.  Changes in blood pressure.  Throat swelling.  Kidney  damage. What happens before the procedure? Medicines Ask your health care provider about:  Changing or stopping your regular medicines. This is especially important if you are taking diabetes medicines or blood thinners.  Taking medicines such as aspirin and ibuprofen. These medicines can thin your blood. Do not take these medicines unless your health care provider tells you to take them.  Taking over-the-counter medicines, vitamins, herbs, and supplements. If you are at risk of having a reaction to the IV contrast material, you may be asked to take medicine before the procedure to prevent a reaction. General instructions  Follow instructions from your health care provider about eating or drinking restrictions.  You may have an exam or lab tests to make sure that you can safely get IV contrast material.  Ask if you will be given a medicine to help you relax (sedative) during the procedure. If so, plan to have someone take you home from the hospital or clinic. What happens during the procedure?  You may be given a sedative to help you relax.  An IV will be inserted into one of your veins.  Contrast material will be injected into your IV.  You may feel warmth or flushing as the contrast material enters your bloodstream.  You may have a metallic taste in your mouth for a few minutes.  The needle may cause some discomfort and bruising.  After the contrast material is in your body, the imaging test will be done. The procedure may vary among health care providers and hospitals. What can I expect after the procedure?  The IV will be removed.  You may be taken to a recovery area if sedation medicines were used. Your blood pressure, heart rate, breathing rate, and blood oxygen level will be monitored until you leave the hospital or clinic. Follow these instructions at home:   Take over-the-counter and prescription medicines only as told by your health care provider. ? Your health  care provider may tell you to not take certain medicines for a couple of days after the procedure. This is especially important if you are taking diabetes medicines.  If you are told, drink enough fluid to keep your urine pale yellow. This will help to remove the contrast material out of your body.  Do not drive for 24 hours if you were given a sedative during your procedure.  It is up to you to get the results of your procedure. Ask your health care provider, or the department that is doing the procedure, when your results will be ready.  Keep all follow-up visits as told by your health care provider. This is important. Contact a health care provider if:    You have redness, swelling, or pain near your IV site. Get help right away if:  You have an abnormal heart rhythm.  You have trouble breathing.  You have: ? Chest pain. ? Pain in your back, neck, arm, jaw, or stomach. ? Nausea or sweating. ? Hives or a rash.  You start shaking and cannot stop. These symptoms may represent a serious problem that is an emergency. Do not wait to see if the symptoms will go away. Get medical help right away. Call your local emergency services (911 in the [place] Do not drive yourself to the hospital. Summary  IV contrast material may be used for imaging tests to help your health care providers see your organs and tissues more clearly.  Tell your health care provider if you are pregnant or may be pregnant.  During the procedure, you may feel warmth or flushing as the contrast material enters your bloodstream.  After the procedure, drink enough fluid to keep your urine pale yellow. This information is not intended to replace advice given to you by your health care provider. Make sure you discuss any questions you have with your health care provider. Document Revised: 08/07/2018 Document Reviewed: 08/07/2018 Elsevier Patient Education  2020 Elsevier Inc.  

## 2020-04-12 ENCOUNTER — Telehealth: Payer: Self-pay

## 2020-04-12 DIAGNOSIS — M5432 Sciatica, left side: Secondary | ICD-10-CM | POA: Diagnosis not present

## 2020-04-12 NOTE — Telephone Encounter (Signed)
Patient contacted the office requesting advise.  He stated that he has his chest CTA Friday and ever since, he has had back pain, to the point that he was unsure if he was able to make it to his appointment with Dr. Prescott Gum tomorrow.  Advised that he needs to contact his PCP, Dr. Nevada Crane for possible medication management of back pain.  He acknowledged receipt and advised that he would call the office back tomorrow if he was unable to make his appointment.

## 2020-04-13 ENCOUNTER — Encounter: Payer: Medicare Other | Admitting: Cardiothoracic Surgery

## 2020-04-13 ENCOUNTER — Telehealth: Payer: Self-pay | Admitting: *Deleted

## 2020-04-13 NOTE — Progress Notes (Signed)
  This encounter was created in error - please disregard/ patient canceled appt.

## 2020-04-13 NOTE — Telephone Encounter (Signed)
Mr. Devon Sanchez called stating he is experiencing back pain and is unable to get out of bed for his appt today with Dr. Prescott Gum. Dr. Prescott Gum notified of this. Per Dr. Prescott Gum, pt told that his CT looked fine. Pt rescheduled to come into the office for visit next Wednesday, November 17.

## 2020-04-15 ENCOUNTER — Ambulatory Visit (HOSPITAL_COMMUNITY)
Admission: RE | Admit: 2020-04-15 | Discharge: 2020-04-15 | Disposition: A | Discharge status: Home / Self Care | Payer: Medicare Other | Source: Ambulatory Visit | Attending: Internal Medicine | Admitting: Internal Medicine

## 2020-04-15 ENCOUNTER — Other Ambulatory Visit: Payer: Self-pay

## 2020-04-15 ENCOUNTER — Other Ambulatory Visit (HOSPITAL_COMMUNITY): Payer: Self-pay | Admitting: Internal Medicine

## 2020-04-15 DIAGNOSIS — M545 Low back pain, unspecified: Secondary | ICD-10-CM | POA: Insufficient documentation

## 2020-04-15 DIAGNOSIS — M5432 Sciatica, left side: Secondary | ICD-10-CM | POA: Diagnosis not present

## 2020-04-20 ENCOUNTER — Encounter: Payer: Self-pay | Admitting: Cardiothoracic Surgery

## 2020-04-20 ENCOUNTER — Other Ambulatory Visit: Payer: Self-pay

## 2020-04-20 ENCOUNTER — Other Ambulatory Visit: Payer: Self-pay | Admitting: *Deleted

## 2020-04-20 ENCOUNTER — Ambulatory Visit: Payer: Medicare Other | Admitting: Cardiothoracic Surgery

## 2020-04-20 ENCOUNTER — Encounter: Payer: Self-pay | Admitting: *Deleted

## 2020-04-20 DIAGNOSIS — I7121 Aneurysm of the ascending aorta, without rupture: Secondary | ICD-10-CM

## 2020-04-20 DIAGNOSIS — I712 Thoracic aortic aneurysm, without rupture: Secondary | ICD-10-CM

## 2020-04-20 DIAGNOSIS — I350 Nonrheumatic aortic (valve) stenosis: Secondary | ICD-10-CM

## 2020-04-20 DIAGNOSIS — M314 Aortic arch syndrome [Takayasu]: Secondary | ICD-10-CM

## 2020-04-20 NOTE — Progress Notes (Signed)
PCP is Celene Squibb, MD Referring Provider is Adrian Prows, MD  Chief Complaint  Patient presents with  . 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  In preparation for surgery he recently underwent a cardiac CT scan which showed both the proximal LAD and circumflex stents to be widely patent.  The bicuspid aortic valve stenosis moderate with a mean gradient 25 mmHg.  No other valvular disease.  Previous left heart cath late summer showed no other significant coronary disease but the images through the stents were not conclusive.   Patient recently had low back pain from arthritis and completed a course of oral prednisone with significant improvement.  He is now ambulating without difficulty.  The patient is finally ready to be scheduled for aortic valve replacement and replacement of ascending aorta.  He has an appointment to be seen by a orthopedist on December 2.  If everything works out he will be scheduled for surgery on December 9 at [facility].  The patient understands that because I am retiring in January that I will ask one of my T CTS partners, Dr. Orvan Seen, to be the main surgeon whom I will assist.   Past Medical History:  Diagnosis Date  . Anxiety   . Aortic root dilatation ([place])   . Atherosclerosis   . Bicuspid aortic valve 07/25/2018  . Depression   . Dizziness and giddiness   . Erectile dysfunction   . Hypercholesterolemia   . Hyperglycemia   . Hyperlipidemia   . Hypertension   . MI (myocardial infarction) ([place])   . Midsystolic murmur   . Reflux     Past Surgical History:  Procedure Laterality Date  . COLONOSCOPY N/A 11/04/2014   Procedure: COLONOSCOPY;  Surgeon: Rogene Houston, MD;  [facility];  Service: Endoscopy;  Laterality: N/A;  830  - moved to 6/2 @ 10:30  . CORONARY ANGIOPLASTY WITH STENT PLACEMENT    . LEFT HEART CATHETERIZATION WITH CORONARY ANGIOGRAM N/A 04/20/2013   Procedure: LEFT HEART CATHETERIZATION WITH CORONARY ANGIOGRAM;  Surgeon: Laverda Page, MD;  Location: [facility] CATH LAB;  Service: Cardiovascular;  Laterality: N/A;  . RIGHT/LEFT HEART CATH AND CORONARY ANGIOGRAPHY N/A 11/10/2019   Procedure: RIGHT/LEFT HEART CATH AND CORONARY ANGIOGRAPHY;  Surgeon: Nigel Mormon, MD;  Location: [place] CV LAB;  Service: Cardiovascular;  Laterality: N/A;    Family History  Problem Relation Age of Onset  . Failure to thrive Mother   . Diabetes Mother   . Heart attack Father   . Prostate cancer Father     Social History Social History   Tobacco Use  . Smoking status: Never Smoker  . Smokeless tobacco: Never Used  Vaping Use  . Vaping Use: Never used  Substance Use Topics  . Alcohol use: No  . Drug use: No    Current Outpatient Medications  Medication Sig Dispense Refill  . acetaminophen (TYLENOL) 500 MG tablet Take 500 mg by mouth every 6 (six) hours as needed for moderate pain or headache.     Marland Kitchen aspirin 81 MG tablet Take 81 mg by mouth in the morning and at bedtime.     . carvedilol (COREG) 6.25 MG tablet Take 1 tablet (6.25 mg total) by mouth 2 (two) times daily  with a meal. 180 tablet 1  . escitalopram (LEXAPRO) 10 MG tablet Take 10 mg by mouth daily.    Marland Kitchen gabapentin (NEURONTIN) 100 MG capsule Take 100 mg by mouth 2 (two) times daily.    Marland Kitchen losartan (COZAAR) 50 MG tablet Take 1 tablet (50 mg total) by mouth daily. (Patient taking differently: Take 50 mg by mouth every evening. ) 90 tablet 3  . methocarbamol (ROBAXIN) 750 MG tablet Take 750 mg by mouth 4 (four) times daily as needed.    . Multiple Vitamins-Minerals (CENTRUM SILVER PO) Take 1 tablet by mouth every evening.     . nitroGLYCERIN (NITROSTAT) 0.4 MG SL tablet Place 1 tablet (0.4 mg total) under the tongue every 5 (five) minutes as needed  for chest pain (CP or SOB). 30 tablet 12  . pantoprazole (PROTONIX) 40 MG tablet Take 40 mg by mouth daily.    . polyethylene glycol (MIRALAX / GLYCOLAX) 17 g packet Take 17 g by mouth daily.    . rosuvastatin (CRESTOR) 20 MG tablet TAKE 1 TABLET BY MOUTH  DAILY 90 tablet 3  . sildenafil (VIAGRA) 100 MG tablet TAKE 1 TABLET BY MOUTH AS  NEEDED 12 tablet 2   No current facility-administered medications for this visit.    Allergies  Allergen Reactions  . Bupropion Other (See Comments)    MADE CHEST HURT  . Naproxen Sodium Other (See Comments)    GI BLEEDING    Review of Systems  No change since previous visit other than low back pain now much improved on Robaxin with a short course of oral prednisone. X-rays of the back showed some chronic degeneration. BP 118/82 (BP Location: Left Arm, Patient Position: Sitting, Cuff Size: Normal)   Pulse 94   Resp 20   Ht 6\' 2"  (1.88 m)   Wt 251 lb 3.2 oz (113.9 kg)   SpO2 94% Comment: RA  BMI 32.25 kg/m  Physical Exam      Physical Exam  General: Overweight but healthy-appearing 69 year old male no acute distress HEENT: Normocephalic pupils equal , dentition adequate Neck: Supple without JVD, adenopathy, or bruit Chest: Clear to auscultation, symmetrical breath sounds, no rhonchi, no tenderness             or deformity Cardiovascular: Regular rate and rhythm, no murmur, no gallop, peripheral pulses             palpable in all extremities Abdomen:  Soft, nontender, no palpable mass or organomegaly Extremities: Warm, well-perfused, no clubbing cyanosis edema or tenderness,              no venous stasis changes of the legs Rectal/GU: Deferred Neuro: Grossly non--focal and symmetrical throughout Skin: Clean and dry without rash or ulceration  Diagnostic Tests: Cardiac CT scan images personally reviewed and results discussed with patient and wife, results as noted above  Impression: Asymptomatic but expanding fusiform ascending aneurysm  now 5.2 cm Bicuspid aortic valve with heavy calcification and moderate aortic stenosis   Plan: Patient will be scheduled for surgery at [facility] on December 9 for aortic valve replacement with a biologic tissue valve and replacement of ascending aorta with a graft.  I discussed the procedure in detail with the patient including the expected benefits alternatives and risks.  He agrees and agrees to proceed with surgery.  He understands that the operation will be performed both by myself and my partner Dr. Orvan Seen.   Len Childs, MD [facility] [phone])  [phone] 

## 2020-05-05 DIAGNOSIS — M5136 Other intervertebral disc degeneration, lumbar region: Secondary | ICD-10-CM | POA: Diagnosis not present

## 2020-05-06 NOTE — Progress Notes (Signed)
[facility] - [place], [place] - [address] [place] [address] Phone: [phone] Fax: [phone]  [place], [place], [address] [place], [place] [address] Phone: [phone] Fax: [place], [place] [place] [address] Phone: [phone] Fax: [phone]      Your procedure is scheduled on 05/12/2020.  Report to [facility] Main Entrance "A" at 05:30 A.M., and check in at the Admitting office.  Call this number if you have problems the morning of surgery:  [phone]  Call [phone] if you have any questions prior to your surgery date Monday-Friday 8am-4pm    Remember:  Do not eat or drink after midnight the night before your surgery    Take these medicines the morning of surgery with A SIP OF WATER  carvedilol (COREG)  escitalopram (LEXAPRO) gabapentin (NEURONTIN)   pantoprazole (PROTONIX) rosuvastatin (CRESTOR)  Take the following the morning of surgery if needed.  acetaminophen (TYLenol) Robaxin nitroGLYCERIN (NITROSTAT)  As of today, STOP taking any Aspirin,Aleve, Naproxen, Ibuprofen, Motrin, Advil, Goody's, BC's, all herbal medications, fish oil, and all vitamins.  Also stop taking Plavix and NSAIDS 7 days prior to your surgery. Stop taking these as of 05/05/2020.                      Do not wear jewelry, make up, or nail polish            Do not wear lotions, powders, perfumes/colognes, or deodorant.            Men may shave face and neck.            Do not bring valuables to the hospital.            [facility] is not responsible for any belongings or valuables.  Do NOT Smoke (Tobacco/Vaping) or drink Alcohol 24 hours prior to your procedure If you use a CPAP at night, you may bring all equipment for your  overnight stay.   Contacts, glasses, dentures or bridgework may not be worn into surgery.      For patients admitted to the hospital, discharge time will be determined by your treatment team.   Patients discharged the day of surgery will not be allowed to drive home, and someone needs to stay with them for 24 hours.    Special instructions:   [place]- Preparing For Surgery  Before surgery, you can play an important role. Because skin is not sterile, your skin needs to be as free of germs as possible. You can reduce the number of germs on your skin by washing with CHG (chlorahexidine gluconate) Soap before surgery.  CHG is an antiseptic cleaner which kills germs and bonds with the skin to continue killing germs even after washing.    Oral Hygiene is also important to reduce your risk of infection.  Remember - BRUSH YOUR TEETH THE MORNING OF SURGERY WITH YOUR REGULAR TOOTHPASTE  Please do not use if you have an allergy to CHG or antibacterial soaps. If your skin becomes reddened/irritated stop using the CHG.  Do not shave (including legs and underarms) for at least 48 hours prior to first CHG shower. It is OK to shave your face.  Please follow these instructions carefully.  1. Shower the NIGHT BEFORE SURGERY and the MORNING OF SURGERY with CHG Soap.   2. If you chose to wash your hair, wash your hair first as usual with your normal shampoo.  3. After you shampoo, rinse your hair and body thoroughly to remove the shampoo.  4. Use CHG as you would any other liquid soap. You can apply CHG directly to the skin and wash gently with a scrungie or a clean washcloth.   5. Apply the CHG Soap to your body ONLY FROM THE NECK DOWN.  Do not use on open wounds or open sores. Avoid contact with your eyes, ears, mouth and genitals (private parts). Wash Face and genitals (private parts)  with your normal soap.   6. Wash thoroughly, paying special attention to the area where your surgery will be  performed.  7. Thoroughly rinse your body with warm water from the neck down.  8. DO NOT shower/wash with your normal soap after using and rinsing off the CHG Soap.  9. Pat yourself dry with a CLEAN TOWEL.  10. Wear CLEAN PAJAMAS to bed the night before surgery  11. Place CLEAN SHEETS on your bed the night of your first shower and DO NOT SLEEP WITH PETS.   Day of Surgery: Wear Clean/Comfortable clothing the morning of surgery Do not apply any deodorants/lotions.   Remember to brush your teeth WITH YOUR REGULAR TOOTHPASTE.   Please read over the following fact sheets that you were given.

## 2020-05-09 ENCOUNTER — Encounter (HOSPITAL_COMMUNITY)
Admission: RE | Admit: 2020-05-09 | Discharge: 2020-05-09 | Disposition: A | Discharge status: Home / Self Care | Payer: Medicare Other | Source: Ambulatory Visit | Attending: Cardiothoracic Surgery | Admitting: Cardiothoracic Surgery

## 2020-05-09 ENCOUNTER — Other Ambulatory Visit (HOSPITAL_COMMUNITY)
Admission: RE | Admit: 2020-05-09 | Discharge: 2020-05-09 | Disposition: A | Discharge status: Home / Self Care | Payer: Medicare Other | Source: Ambulatory Visit | Attending: Cardiothoracic Surgery | Admitting: Cardiothoracic Surgery

## 2020-05-09 ENCOUNTER — Other Ambulatory Visit: Payer: Self-pay

## 2020-05-09 ENCOUNTER — Encounter: Payer: Self-pay | Admitting: Cardiothoracic Surgery

## 2020-05-09 ENCOUNTER — Ambulatory Visit (HOSPITAL_BASED_OUTPATIENT_CLINIC_OR_DEPARTMENT_OTHER)
Admission: RE | Admit: 2020-05-09 | Discharge: 2020-05-09 | Disposition: A | Discharge status: Home / Self Care | Payer: Medicare Other | Source: Ambulatory Visit | Attending: Cardiothoracic Surgery | Admitting: Cardiothoracic Surgery

## 2020-05-09 ENCOUNTER — Ambulatory Visit: Payer: Medicare Other | Admitting: Cardiothoracic Surgery

## 2020-05-09 ENCOUNTER — Encounter (HOSPITAL_COMMUNITY): Payer: Self-pay

## 2020-05-09 ENCOUNTER — Ambulatory Visit (HOSPITAL_COMMUNITY)
Admission: RE | Admit: 2020-05-09 | Discharge: 2020-05-09 | Disposition: A | Discharge status: Home / Self Care | Payer: Medicare Other | Source: Ambulatory Visit | Attending: Cardiothoracic Surgery | Admitting: Cardiothoracic Surgery

## 2020-05-09 VITALS — BP 105/69 | HR 85 | Resp 18 | Ht 74.0 in

## 2020-05-09 DIAGNOSIS — I350 Nonrheumatic aortic (valve) stenosis: Secondary | ICD-10-CM | POA: Insufficient documentation

## 2020-05-09 DIAGNOSIS — G4733 Obstructive sleep apnea (adult) (pediatric): Secondary | ICD-10-CM | POA: Diagnosis not present

## 2020-05-09 DIAGNOSIS — Q231 Congenital insufficiency of aortic valve: Secondary | ICD-10-CM | POA: Diagnosis not present

## 2020-05-09 DIAGNOSIS — I214 Non-ST elevation (NSTEMI) myocardial infarction: Secondary | ICD-10-CM | POA: Diagnosis not present

## 2020-05-09 DIAGNOSIS — M314 Aortic arch syndrome [Takayasu]: Secondary | ICD-10-CM

## 2020-05-09 DIAGNOSIS — I7121 Aneurysm of the ascending aorta, without rupture: Secondary | ICD-10-CM

## 2020-05-09 DIAGNOSIS — J939 Pneumothorax, unspecified: Secondary | ICD-10-CM | POA: Diagnosis not present

## 2020-05-09 DIAGNOSIS — I719 Aortic aneurysm of unspecified site, without rupture: Secondary | ICD-10-CM | POA: Diagnosis not present

## 2020-05-09 DIAGNOSIS — I4510 Unspecified right bundle-branch block: Secondary | ICD-10-CM | POA: Insufficient documentation

## 2020-05-09 DIAGNOSIS — I4891 Unspecified atrial fibrillation: Secondary | ICD-10-CM | POA: Diagnosis not present

## 2020-05-09 DIAGNOSIS — I2510 Atherosclerotic heart disease of native coronary artery without angina pectoris: Secondary | ICD-10-CM | POA: Diagnosis not present

## 2020-05-09 DIAGNOSIS — I10 Essential (primary) hypertension: Secondary | ICD-10-CM | POA: Diagnosis not present

## 2020-05-09 DIAGNOSIS — D6959 Other secondary thrombocytopenia: Secondary | ICD-10-CM | POA: Diagnosis not present

## 2020-05-09 DIAGNOSIS — I252 Old myocardial infarction: Secondary | ICD-10-CM | POA: Diagnosis not present

## 2020-05-09 DIAGNOSIS — I454 Nonspecific intraventricular block: Secondary | ICD-10-CM | POA: Diagnosis not present

## 2020-05-09 DIAGNOSIS — I517 Cardiomegaly: Secondary | ICD-10-CM | POA: Diagnosis not present

## 2020-05-09 DIAGNOSIS — I712 Thoracic aortic aneurysm, without rupture: Secondary | ICD-10-CM

## 2020-05-09 DIAGNOSIS — I358 Other nonrheumatic aortic valve disorders: Secondary | ICD-10-CM | POA: Diagnosis not present

## 2020-05-09 DIAGNOSIS — Z20822 Contact with and (suspected) exposure to covid-19: Secondary | ICD-10-CM | POA: Insufficient documentation

## 2020-05-09 DIAGNOSIS — E785 Hyperlipidemia, unspecified: Secondary | ICD-10-CM | POA: Diagnosis not present

## 2020-05-09 DIAGNOSIS — I493 Ventricular premature depolarization: Secondary | ICD-10-CM | POA: Diagnosis not present

## 2020-05-09 DIAGNOSIS — I351 Nonrheumatic aortic (valve) insufficiency: Secondary | ICD-10-CM | POA: Diagnosis not present

## 2020-05-09 DIAGNOSIS — Z9989 Dependence on other enabling machines and devices: Secondary | ICD-10-CM | POA: Diagnosis not present

## 2020-05-09 DIAGNOSIS — E119 Type 2 diabetes mellitus without complications: Secondary | ICD-10-CM | POA: Diagnosis not present

## 2020-05-09 DIAGNOSIS — D62 Acute posthemorrhagic anemia: Secondary | ICD-10-CM | POA: Diagnosis not present

## 2020-05-09 DIAGNOSIS — R0602 Shortness of breath: Secondary | ICD-10-CM | POA: Diagnosis not present

## 2020-05-09 DIAGNOSIS — R531 Weakness: Secondary | ICD-10-CM | POA: Diagnosis not present

## 2020-05-09 DIAGNOSIS — J9811 Atelectasis: Secondary | ICD-10-CM | POA: Diagnosis not present

## 2020-05-09 DIAGNOSIS — Z01818 Encounter for other preprocedural examination: Secondary | ICD-10-CM | POA: Diagnosis not present

## 2020-05-09 HISTORY — DX: Sleep apnea, unspecified: G47.30

## 2020-05-09 HISTORY — DX: Thoracic aortic aneurysm, without rupture: I71.2

## 2020-05-09 HISTORY — DX: Atherosclerotic heart disease of native coronary artery without angina pectoris: I25.10

## 2020-05-09 HISTORY — DX: Aneurysm of the ascending aorta, without rupture: I71.21

## 2020-05-09 LAB — URINALYSIS, ROUTINE W REFLEX MICROSCOPIC
Bilirubin Urine: NEGATIVE
Glucose, UA: NEGATIVE mg/dL
Hgb urine dipstick: NEGATIVE
Ketones, ur: NEGATIVE mg/dL
Leukocytes,Ua: NEGATIVE
Nitrite: NEGATIVE
Protein, ur: NEGATIVE mg/dL
Specific Gravity, Urine: 1.021 (ref 1.005–1.030)
pH: 5 (ref 5.0–8.0)

## 2020-05-09 LAB — COMPREHENSIVE METABOLIC PANEL
ALT: 27 U/L (ref 0–44)
AST: 23 U/L (ref 15–41)
Albumin: 3.5 g/dL (ref 3.5–5.0)
Alkaline Phosphatase: 67 U/L (ref 38–126)
Anion gap: 11 (ref 5–15)
BUN: 21 mg/dL (ref 8–23)
CO2: 21 mmol/L — ABNORMAL LOW (ref 22–32)
Calcium: 9.3 mg/dL (ref 8.9–10.3)
Chloride: 107 mmol/L (ref 98–111)
Creatinine, Ser: 1.02 mg/dL (ref 0.61–1.24)
GFR, Estimated: >60 mL/min (ref >60)
Glucose, Bld: 106 mg/dL — ABNORMAL HIGH (ref 70–99)
Potassium: 4.2 mmol/L (ref 3.5–5.1)
Sodium: 139 mmol/L (ref 135–145)
Total Bilirubin: 0.9 mg/dL (ref 0.3–1.2)
Total Protein: 7 g/dL (ref 6.5–8.1)

## 2020-05-09 LAB — BLOOD GAS, ARTERIAL
Acid-base deficit: 1.1 mmol/L (ref 0.0–2.0)
Bicarbonate: 22.8 mmol/L (ref 20.0–28.0)
Drawn by: 602861
FIO2: 21
O2 Saturation: 96.2 %
Patient temperature: 37
pCO2 arterial: 35.7 mmHg (ref 32.0–48.0)
pH, Arterial: 7.421 (ref 7.350–7.450)
pO2, Arterial: 85.3 mmHg (ref 83.0–108.0)

## 2020-05-09 LAB — CBC
HCT: 37.7 % — ABNORMAL LOW (ref 39.0–52.0)
Hemoglobin: 13.4 g/dL (ref 13.0–17.0)
MCH: 33.3 pg (ref 26.0–34.0)
MCHC: 35.5 g/dL (ref 30.0–36.0)
MCV: 93.5 fL (ref 80.0–100.0)
Platelets: 204 10*3/uL (ref 150–400)
RBC: 4.03 MIL/uL — ABNORMAL LOW (ref 4.22–5.81)
RDW: 14.2 % (ref 11.5–15.5)
WBC: 3.9 10*3/uL — ABNORMAL LOW (ref 4.0–10.5)
nRBC: 0 % (ref 0.0–0.2)

## 2020-05-09 LAB — PROTIME-INR
INR: 1.1 (ref 0.8–1.2)
Prothrombin Time: 13.4 seconds (ref 11.4–15.2)

## 2020-05-09 LAB — HEMOGLOBIN A1C
Hgb A1c MFr Bld: 5.8 % — ABNORMAL HIGH (ref 4.8–5.6)
Mean Plasma Glucose: 119.76 mg/dL

## 2020-05-09 LAB — SURGICAL PCR SCREEN
MRSA, PCR: NEGATIVE
Staphylococcus aureus: NEGATIVE

## 2020-05-09 LAB — APTT: aPTT: 26 seconds (ref 24–36)

## 2020-05-09 NOTE — Progress Notes (Signed)
Pre-CABG completed 12/6

## 2020-05-09 NOTE — Progress Notes (Signed)
PCP - Dr. Allyn Kenner Cardiologist - Dr. Einar Gip  Chest x-ray - 05/09/20 EKG - 05/09/20 Stress Test - 2015-C.E ECHO - 07/31/19 Cardiac Cath - 11/10/19  Sleep Study - 2017 CPAP - wears QHS- does not know pressure settings  Blood Thinner Instructions: pt not longer taking plavix Aspirin Instructions: pt to follow MD instructions on ASA. Instructed to hold all other NSAID's/herbal medicines/vitamins as of today.  Anesthesia review: cardiac history  Covid test- 05/09/20 @ [facility]. Pt educated on quarantining until DOS  Patient denies shortness of breath, fever, cough and chest pain at PAT appointment   Patient verbalized understanding of instructions that were given to them at the PAT appointment. Patient was also instructed that they will need to review over the PAT instructions again at home before surgery.

## 2020-05-09 NOTE — Progress Notes (Signed)
Anesthesia Chart Review:  Case: [id number] Date/Time: 05/12/20 0715   Procedures:      BENTALL PROCEDURE (N/A Chest)     TRANSESOPHAGEAL ECHOCARDIOGRAM (TEE) (N/A )   Anesthesia type: General   Pre-op diagnosis:      AS     ASCENDING AORTIC ANEURYSM   Location: [facility] OR ROOM 15 / [place] [facility]   Surgeons: Wonda Olds, MD      DISCUSSION: Patient is a 69 year old male scheduled for the above procedure.  History includes never smoker, CAD (MI s/p LAD stent 2007; NSTEMI s/p DES prox-mid LAD and DES prox-mid CX 04/20/13), murmur (bicuspid AV with moderate AS 08/04/19), ascending thoracic aortic aneurysm, HTN, HLD, OSA (CPAP), dizziness, anxiety, reflux, hypercholesterolemia. BMI is consistent with obesity.    05/09/20 presurgical COVID-19 test in process.  Anesthesia team to evaluate on the day of surgery.   VS: BP 120/77   Pulse 70   Temp 36.9 C (Oral)   Resp 18   Ht 6\' 2"  (1.88 m)   Wt 114.9 kg   SpO2 98%   BMI 32.53 kg/m    PROVIDERS: Celene Squibb, MD is PCP  Adrian Prows, MD is cardiologist   LABS: Labs reviewed: Acceptable for surgery. (all labs ordered are listed, but only abnormal results are displayed)  Labs Reviewed  CBC - Abnormal; Notable for the following components:      Result Value   WBC 3.9 (*)    RBC 4.03 (*)    HCT 37.7 (*)    All other components within normal limits  COMPREHENSIVE METABOLIC PANEL - Abnormal; Notable for the following components:   CO2 21 (*)    Glucose, Bld 106 (*)    All other components within normal limits  HEMOGLOBIN A1C - Abnormal; Notable for the following components:   Hgb A1c MFr Bld 5.8 (*)    All other components within normal limits  SURGICAL PCR SCREEN  APTT  BLOOD GAS, ARTERIAL  PROTIME-INR  URINALYSIS, ROUTINE W REFLEX MICROSCOPIC  TYPE AND SCREEN     IMAGES: CXR 05/09/20: FINDINGS: Normal heart size and pulmonary vascularity. Density at inferior mediastinum consistent with a hiatal hernia. Remaining mediastinal  contours normal. Lungs clear. No pulmonary infiltrate, pleural effusion or pneumothorax. Osseous structures unremarkable. IMPRESSION: Hiatal hernia. No acute abnormalities.  CT Chest 08/13/19: IMPRESSION: 1. 5 cm dilation of ascending thoracic aorta, likely associated with aortic stenosis due to dense calcifications about the aortic valve. Ascending thoracic aortic aneurysm. Recommend semi-annual imaging followup by CTA or MRA and referral to cardiothoracic surgery if not already obtained. This recommendation follows 2010 ACCF/AHA/AATS/ACR/ASA/SCA/SCAI/SIR/STS/SVM Guidelines for the Diagnosis and Management of Patients With Thoracic Aortic Disease. Circulation. 2010; 121: O130-Q657. Aortic aneurysm NOS (ICD10-I71.9) 2. Coronary artery calcifications and signs of previous percutaneous coronary intervention. 3. 4 mm nodule in the left lung base. No follow-up needed if patient is low-risk (and has no known or suspected primary neoplasm). Non-contrast chest CT can be considered in 12 months if patient is high-risk. This recommendation follows the consensus statement: Guidelines for Management of Incidental Pulmonary Nodules Detected on CT Images: From the Fleischner Society 2017; Radiology 2017; 284:228-243. 4. 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   EKG: 05/09/2020: Normal sinus rhythm Right bundle branch block Abnormal ECG   CV: Carotid artery [place] 05/09/2020 (Preliminary): Summary:  - Right Carotid: Velocities in the right ICA are consistent with a 1-39%  stenosis.  - Left Carotid: Velocities in the left ICA are consistent  with a 1-39%  stenosis.  - Vertebrals: Bilateral vertebral arteries demonstrate antegrade flow.  - Subclavians: Normal flow hemodynamics were seen in bilateral subclavian arteries. (Comparison 08/19/2019: 1-15% RICA and 02-40% LICA, new since 97/35/3299)   CT Coronary 04/08/20: IMPRESSION: 1. Normal coronary origin with right  dominance. 2. Patent stents in the proximal LAD and proximal portion of LCX arteries with only minimal in-stent restenosis. Otherwise mild diffuse non-obstructive CAD. 3. Aorta: Severely dilated ascending aortic aneurysm with maximum diameter 52 mm. There are moderate diffuse calcifications and atherosclerotic plaque, no dissection. Normal size of the aortic arch and descending thoracic aorta.   RHC/LHC 11/10/2019 LM: Normal LAD: Patent prox LAD stent with 20% late lumen loss at proximal edge LCx: Patent prox stent  [place]: Normal RCA: 95% stenosis in small RV marginal branch, unchanged compared to previous angiogram in 2014 Normal RHC. No pulmonary hypertension   Echocardiogram 08/04/2019:  Left ventricle cavity is normal in size. Mild concentric hypertrophy of  the left ventricle. Normal LV systolic function with EF 64%. Normal global  wall motion.  Doppler evidence of grade I (impaired) diastolic dysfunction, normal LAP.  Bicuspid aortic valve with fused left and right coronary cusp. Severe  calcification of noncoronary cusp. Moderate aortic stenosis. Trace aortic  regurgitation. Aortic valve mean gradient of 25 mmHg, Vmax of 3.2 m/s.  Calculated aortic valve area by continuity equation is 1.2 cm.  Mild tricuspid regurgitation. Estimated pulmonary artery systolic pressure  is 19 mmHg.  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  Compared to previous study on 06/25/2019, proximal ascending aorta  dimension increased from 4.3 cm to 4.5 cm, s/p bicuspid aortopathy.    Past Medical History:  Diagnosis Date  . Anxiety   . Aortic root dilatation ([place])   . Atherosclerosis   . Bicuspid aortic valve 07/25/2018  . Coronary artery disease   . Depression   . Dizziness and giddiness   . Erectile dysfunction   . Hypercholesterolemia   . Hyperglycemia   . Hyperlipidemia   . Hypertension   . MI (myocardial infarction) ([place])   . Midsystolic murmur    bicuspid AV with moderate AS (08/2019)  .  Reflux   . Sleep apnea   . Thoracic ascending aortic aneurysm [facility])     Past Surgical History:  Procedure Laterality Date  . COLONOSCOPY N/A 11/04/2014   Procedure: COLONOSCOPY;  Surgeon: Rogene Houston, MD;  [facility];  Service: Endoscopy;  Laterality: N/A;  830 - moved to 6/2 @ 10:30  . CORONARY ANGIOPLASTY WITH STENT PLACEMENT    . LEFT HEART CATHETERIZATION WITH CORONARY ANGIOGRAM N/A 04/20/2013   Procedure: LEFT HEART CATHETERIZATION WITH CORONARY ANGIOGRAM;  Surgeon: Laverda Page, MD;  Location: [facility] CATH LAB;  Service: Cardiovascular;  Laterality: N/A;  . RIGHT/LEFT HEART CATH AND CORONARY ANGIOGRAPHY N/A 11/10/2019   Procedure: RIGHT/LEFT HEART CATH AND CORONARY ANGIOGRAPHY;  Surgeon: Nigel Mormon, MD;  Location: [place] CV LAB;  Service: Cardiovascular;  Laterality: N/A;    MEDICATIONS: . acetaminophen (TYLENOL) 500 MG tablet  . aspirin 81 MG tablet  . carvedilol (COREG) 6.25 MG tablet  . escitalopram (LEXAPRO) 10 MG tablet  . gabapentin (NEURONTIN) 100 MG capsule  . losartan (COZAAR) 50 MG tablet  . methocarbamol (ROBAXIN) 750 MG tablet  . Multiple Vitamins-Minerals (CENTRUM SILVER PO)  . nitroGLYCERIN (NITROSTAT) 0.4 MG SL tablet  . pantoprazole (PROTONIX) 40 MG tablet  . polyethylene glycol (MIRALAX / GLYCOLAX) 17 g packet  . rosuvastatin (CRESTOR) 20  MG tablet  . sildenafil (VIAGRA) 100 MG tablet   No current facility-administered medications for this encounter.    Myra Gianotti, PA-C Surgical Short Stay/Anesthesiology [facility] Phone [phone] [facility] Phone [phone] 05/09/2020 4:42 PM

## 2020-05-09 NOTE — Progress Notes (Signed)
[address]       [place] [address]             [phone]     CARDIOTHORACIC SURGERY CONSULTATION REPORT  Referring Provider is Adrian Prows, MD Primary Cardiologist is No primary care provider on file. PCP is Celene Squibb, MD  Chief Complaint  Patient presents with  . Consult    Initial surgical consult with Dr. Orvan Seen    HPI:  69 year old gentleman presents for consultation regarding moderate aortic valve disease and ascending aortic aneurysm.  He has been followed by Dr. Prescott Gum, who has kindly asked me to see this patient.  The patient has also been followed by Dr. 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  He has a 5.2 cm ascending aortic aneurysm.  The patient endorses shortness of breath and mild chest pain with exertion.  He also reports occasional skipped beats or palpitation but there has been no documented atrial fibrillation  Past Medical History:  Diagnosis Date  . Anxiety   . Aortic root dilatation ([place])   . Atherosclerosis   . Bicuspid aortic valve 07/25/2018  . Depression   . Dizziness and giddiness   . Erectile dysfunction   . Hypercholesterolemia   . Hyperglycemia   . Hyperlipidemia   . Hypertension   . MI (myocardial infarction) ([place])   . Midsystolic murmur   . Reflux     Past Surgical History:  Procedure Laterality Date  . COLONOSCOPY N/A 11/04/2014   Procedure: COLONOSCOPY;  Surgeon: Rogene Houston, MD;  Location: [facility];  Service: Endoscopy;  Laterality: N/A;  830 - moved to 6/2 @ 10:30  . CORONARY ANGIOPLASTY WITH STENT PLACEMENT    . LEFT HEART CATHETERIZATION WITH CORONARY ANGIOGRAM N/A 04/20/2013   Procedure: LEFT HEART CATHETERIZATION WITH CORONARY ANGIOGRAM;  Surgeon: Laverda Page, MD;  Location: [facility] CATH LAB;  Service: Cardiovascular;  Laterality: N/A;  . RIGHT/LEFT HEART CATH AND CORONARY ANGIOGRAPHY N/A 11/10/2019   Procedure: RIGHT/LEFT HEART CATH  AND CORONARY ANGIOGRAPHY;  Surgeon: Nigel Mormon, MD;  Location: [facility] CV LAB;  Service: Cardiovascular;  Laterality: N/A;    Family History  Problem Relation Age of Onset  . Failure to thrive Mother   . Diabetes Mother   . Heart attack Father   . Prostate cancer Father     Social History   Socioeconomic History  . Marital status: Married    Spouse name: Not on file  . Number of children: 4  . Years of education: Not on file  . Highest education level: Not on file  Occupational History  . Not on file  Tobacco Use  . Smoking status: Never Smoker  . Smokeless tobacco: Never Used  Vaping Use  . Vaping Use: Never used  Substance and Sexual Activity  . Alcohol use: No  . Drug use: No  . Sexual activity: Never  Other Topics Concern  . Not on file  Social History Narrative   Drinks coffee occasionally.   Social Determinants of Health   Financial Resource Strain:   . Difficulty of Paying Living Expenses: Not on file  Food Insecurity:   . Worried About Charity fundraiser in the Last Year: Not on file  . Ran Out of Food in the Last Year: Not on file  Transportation Needs:   . Lack of Transportation (Medical): Not on file  . Lack  of Transportation (Non-Medical): Not on file  Physical Activity:   . Days of Exercise per Week: Not on file  . Minutes of Exercise per Session: Not on file  Stress:   . Feeling of Stress : Not on file  Social Connections:   . Frequency of Communication with Friends and Family: Not on file  . Frequency of Social Gatherings with Friends and Family: Not on file  . Attends Religious Services: Not on file  . Active Member of Clubs or Organizations: Not on file  . Attends Archivist Meetings: Not on file  . Marital Status: Not on file  Intimate Partner Violence:   . Fear of Current or Ex-Partner: Not on file  . Emotionally Abused: Not on file  . Physically Abused: Not on file  . Sexually Abused: Not on file    Current  Outpatient Medications  Medication Sig Dispense Refill  . acetaminophen (TYLENOL) 500 MG tablet Take 500 mg by mouth every 6 (six) hours as needed for moderate pain or headache.     Marland Kitchen aspirin 81 MG tablet Take 81 mg by mouth daily.     . carvedilol (COREG) 6.25 MG tablet Take 1 tablet (6.25 mg total) by mouth 2 (two) times daily with a meal. 180 tablet 1  . escitalopram (LEXAPRO) 10 MG tablet Take 10 mg by mouth daily.    Marland Kitchen losartan (COZAAR) 50 MG tablet Take 1 tablet (50 mg total) by mouth daily. (Patient taking differently: Take 50 mg by mouth every evening. ) 90 tablet 3  . Multiple Vitamins-Minerals (CENTRUM SILVER PO) Take 1 tablet by mouth every evening.     . nitroGLYCERIN (NITROSTAT) 0.4 MG SL tablet Place 1 tablet (0.4 mg total) under the tongue every 5 (five) minutes as needed for chest pain (CP or SOB). 30 tablet 12  . pantoprazole (PROTONIX) 40 MG tablet Take 40 mg by mouth daily.    . polyethylene glycol (MIRALAX / GLYCOLAX) 17 g packet Take 17 g by mouth daily.    . rosuvastatin (CRESTOR) 20 MG tablet TAKE 1 TABLET BY MOUTH  DAILY (Patient taking differently: Take 20 mg by mouth daily. ) 90 tablet 3  . sildenafil (VIAGRA) 100 MG tablet TAKE 1 TABLET BY MOUTH AS  NEEDED (Patient taking differently: Take 100 mg by mouth as needed for erectile dysfunction. ) 12 tablet 2  . gabapentin (NEURONTIN) 100 MG capsule Take 100 mg by mouth 2 (two) times daily as needed (pain). (Patient not taking: Reported on 05/09/2020)    . methocarbamol (ROBAXIN) 750 MG tablet Take 750 mg by mouth 2 (two) times daily as needed for muscle spasms.  (Patient not taking: Reported on 05/09/2020)     No current facility-administered medications for this visit.    Allergies  Allergen Reactions  . Bupropion Other (See Comments)    MADE CHEST HURT  . Naproxen Sodium Other (See Comments)    GI BLEEDING      Review of Systems:   General:  Reduced energy  Cardiac:  As per HPI  Respiratory:  No  asthma  GI:   No abdominal pain or bleeding  GU:   No prostate or kidney disease  Vascular:  Denies pain just above claudication  Neuro:   No history of stroke or seizure activity  Musculoskeletal: Negative  Skin:   Negative  Psych:   History of anxiety and depression  Eyes:   Negative  ENT:   Negative  Hematologic:  Negative  Endocrine:  Positive diabetes,      Physical Exam:   BP 105/69 (BP Location: Right Arm, Patient Position: Sitting)   Pulse 85   Resp 18   Ht 6\' 2"  (1.88 m)   SpO2 94% Comment: RA with mask on  BMI 32.53 kg/m   General:   well-appearing, no acute distress   HEENT:  Unremarkable   Neck:   no JVD, no bruits, no adenopathy   Chest:   clear to auscultation, symmetrical breath sounds, no wheezes, no rhonchi   CV:   RRR, soft systolic murmur at right sternal border  Abdomen:  soft, non-tender, no masses   Extremities:  warm, well-perfused, pulses intact throughout, no LE edema  Rectal/GU  Deferred  Neuro:   Grossly non-focal and symmetrical throughout  Skin:   Clean and dry, no rashes, no breakdown   Diagnostic Tests:  I have personally reviewed his available imaging studies including left heart catheterization and CT chest and agree with their interpretation   Impression:  69 year old man with bicuspid aortic valve disease and ascending aortic aneurysm   Plan:  OR for placement of ascending aorta and aortic valve replacement on Thursday of this week   I spent in excess of 20 minutes during the conduct of this office consultation and >50% of this time involved direct face-to-face encounter with the patient for counseling and/or coordination of their care.          Level 3 Office Consult = 40 minutes         Level 4 Office Consult = 60 minutes         Level 5 Office Consult = 80 minutes  B.  Murvin Natal, MD 05/09/2020 1:16 PM

## 2020-05-09 NOTE — Anesthesia Preprocedure Evaluation (Addendum)
Anesthesia Evaluation  Patient identified by MRN, date of birth, ID band  Reviewed: Allergy & Precautions, NPO status , Patient's Chart, lab work & pertinent test results  Airway Mallampati: II  TM Distance: >3 FB     Dental   Pulmonary sleep apnea ,    breath sounds clear to auscultation       Cardiovascular hypertension, + CAD and + Past MI   Rhythm:Regular Rate:Normal     Neuro/Psych Anxiety Depression    GI/Hepatic negative GI ROS, Neg liver ROS,   Endo/Other  negative endocrine ROS  Renal/GU      Musculoskeletal   Abdominal   Peds  Hematology   Anesthesia Other Findings   Reproductive/Obstetrics                            Anesthesia Physical Anesthesia Plan  ASA: III  Anesthesia Plan: General   Post-op Pain Management:    Induction: Intravenous  PONV Risk Score and Plan: 3 and Ondansetron, Dexamethasone and Midazolam  Airway Management Planned: Oral ETT  Additional Equipment: Arterial line, PA Cath, TEE and Ultrasound Guidance Line Placement  Intra-op Plan:   Post-operative Plan: Post-operative intubation/ventilation  Informed Consent: I have reviewed the patients History and Physical, chart, labs and discussed the procedure including the risks, benefits and alternatives for the proposed anesthesia with the patient or authorized representative who has indicated his/her understanding and acceptance.       Plan Discussed with: CRNA and Anesthesiologist  Anesthesia Plan Comments: (PAT note written 05/09/2020 by Myra Gianotti, PA-C. )       Anesthesia Quick Evaluation

## 2020-05-10 LAB — SARS CORONAVIRUS 2 (TAT 6-24 HRS): SARS Coronavirus 2: NEGATIVE

## 2020-05-11 ENCOUNTER — Other Ambulatory Visit: Payer: Self-pay

## 2020-05-11 ENCOUNTER — Ambulatory Visit (HOSPITAL_COMMUNITY)
Admission: RE | Admit: 2020-05-11 | Discharge: 2020-05-11 | Disposition: A | Discharge status: Home / Self Care | Payer: Medicare Other | Source: Ambulatory Visit | Attending: Cardiothoracic Surgery | Admitting: Cardiothoracic Surgery

## 2020-05-11 DIAGNOSIS — I712 Thoracic aortic aneurysm, without rupture: Secondary | ICD-10-CM

## 2020-05-11 DIAGNOSIS — I7121 Aneurysm of the ascending aorta, without rupture: Secondary | ICD-10-CM

## 2020-05-11 DIAGNOSIS — I350 Nonrheumatic aortic (valve) stenosis: Secondary | ICD-10-CM

## 2020-05-11 LAB — PULMONARY FUNCTION TEST
DL/VA % pred: 98 %
DL/VA: 3.99 ml/min/mmHg/L
DLCO cor % pred: 86 %
DLCO cor: 25.76 ml/min/mmHg
DLCO unc % pred: 83 %
DLCO unc: 24.84 ml/min/mmHg
FEF 25-75 Post: 3.57 L/sec
FEF 25-75 Pre: 3.32 L/sec
FEF2575-%Change-Post: 7 %
FEF2575-%Pred-Post: 118 %
FEF2575-%Pred-Pre: 110 %
FEV1-%Change-Post: 2 %
FEV1-%Pred-Post: 86 %
FEV1-%Pred-Pre: 84 %
FEV1-Post: 3.38 L
FEV1-Pre: 3.28 L
FEV1FVC-%Change-Post: -1 %
FEV1FVC-%Pred-Pre: 110 %
FEV6-%Change-Post: 4 %
FEV6-%Pred-Post: 84 %
FEV6-%Pred-Pre: 80 %
FEV6-Post: 4.2 L
FEV6-Pre: 4.02 L
FEV6FVC-%Pred-Post: 105 %
FEV6FVC-%Pred-Pre: 105 %
FVC-%Change-Post: 4 %
FVC-%Pred-Post: 80 %
FVC-%Pred-Pre: 76 %
FVC-Post: 4.2 L
FVC-Pre: 4.02 L
Post FEV1/FVC ratio: 80 %
Post FEV6/FVC ratio: 100 %
Pre FEV1/FVC ratio: 82 %
Pre FEV6/FVC Ratio: 100 %
RV % pred: 120 %
RV: 3.13 L
TLC % pred: 94 %
TLC: 7.37 L

## 2020-05-11 MED ORDER — POTASSIUM CHLORIDE 2 MEQ/ML IV SOLN
80.0000 meq | INTRAVENOUS | Status: DC
  Filled 2020-05-11: qty 40

## 2020-05-11 MED ORDER — VANCOMYCIN HCL 1500 MG/300ML IV SOLN
1500.0000 mg | INTRAVENOUS | Status: AC
  Administered 2020-05-12: 1500 mg via INTRAVENOUS
  Filled 2020-05-11 (×2): qty 300

## 2020-05-11 MED ORDER — PHENYLEPHRINE HCL-NACL 20-0.9 MG/250ML-% IV SOLN
30.0000 ug/min | INTRAVENOUS | Status: AC
  Administered 2020-05-12: 25 ug/min via INTRAVENOUS
  Filled 2020-05-11: qty 250

## 2020-05-11 MED ORDER — SODIUM CHLORIDE 0.9 % IV SOLN
1.5000 g | INTRAVENOUS | Status: AC
  Administered 2020-05-12: 1.5 g via INTRAVENOUS
  Filled 2020-05-11: qty 1.5

## 2020-05-11 MED ORDER — INSULIN REGULAR(HUMAN) IN NACL 100-0.9 UT/100ML-% IV SOLN
INTRAVENOUS | Status: AC
  Administered 2020-05-12: .9 [IU]/h via INTRAVENOUS
  Filled 2020-05-11: qty 100

## 2020-05-11 MED ORDER — EPINEPHRINE HCL 5 MG/250ML IV SOLN IN NS
0.0000 ug/min | INTRAVENOUS | Status: DC
  Filled 2020-05-11: qty 250

## 2020-05-11 MED ORDER — PLASMA-LYTE 148 IV SOLN
INTRAVENOUS | Status: DC
  Filled 2020-05-11: qty 2.5

## 2020-05-11 MED ORDER — SODIUM CHLORIDE 0.9 % IV SOLN
INTRAVENOUS | Status: DC
  Filled 2020-05-11: qty 30

## 2020-05-11 MED ORDER — TRANEXAMIC ACID (OHS) PUMP PRIME SOLUTION
2.0000 mg/kg | INTRAVENOUS | Status: DC
  Filled 2020-05-11: qty 2.3

## 2020-05-11 MED ORDER — MILRINONE LACTATE IN DEXTROSE 20-5 MG/100ML-% IV SOLN
0.3000 ug/kg/min | INTRAVENOUS | Status: DC
  Filled 2020-05-11: qty 100

## 2020-05-11 MED ORDER — VANCOMYCIN HCL 1250 MG/250ML IV SOLN
1250.0000 mg | INTRAVENOUS | Status: DC
  Filled 2020-05-11: qty 250

## 2020-05-11 MED ORDER — ALBUTEROL SULFATE (2.5 MG/3ML) 0.083% IN NEBU
2.5000 mg | INHALATION_SOLUTION | Freq: Once | RESPIRATORY_TRACT | Status: AC
  Administered 2020-05-11: 2.5 mg via RESPIRATORY_TRACT

## 2020-05-11 MED ORDER — TRANEXAMIC ACID 1000 MG/10ML IV SOLN
1.5000 mg/kg/h | INTRAVENOUS | Status: AC
  Administered 2020-05-12: 1.5 mg/kg/h via INTRAVENOUS
  Filled 2020-05-11: qty 25

## 2020-05-11 MED ORDER — TRANEXAMIC ACID (OHS) BOLUS VIA INFUSION
15.0000 mg/kg | INTRAVENOUS | Status: AC
  Administered 2020-05-12: 1723.5 mg via INTRAVENOUS
  Filled 2020-05-11: qty 1724

## 2020-05-11 MED ORDER — SODIUM CHLORIDE 0.9 % IV SOLN
750.0000 mg | INTRAVENOUS | Status: AC
  Administered 2020-05-12: 750 mg via INTRAVENOUS
  Filled 2020-05-11: qty 750

## 2020-05-11 MED ORDER — MAGNESIUM SULFATE 50 % IJ SOLN
40.0000 meq | INTRAMUSCULAR | Status: DC
  Filled 2020-05-11: qty 9.85

## 2020-05-11 MED ORDER — NITROGLYCERIN IN D5W 200-5 MCG/ML-% IV SOLN
2.0000 ug/min | INTRAVENOUS | Status: DC
  Filled 2020-05-11: qty 250

## 2020-05-11 MED ORDER — DEXMEDETOMIDINE HCL IN NACL 400 MCG/100ML IV SOLN
0.1000 ug/kg/h | INTRAVENOUS | Status: AC
  Administered 2020-05-12: .5 ug/kg/h via INTRAVENOUS
  Filled 2020-05-11: qty 100

## 2020-05-11 MED ORDER — NOREPINEPHRINE 4 MG/250ML-% IV SOLN
0.0000 ug/min | INTRAVENOUS | Status: DC
  Filled 2020-05-11: qty 250

## 2020-05-12 ENCOUNTER — Inpatient Hospital Stay (HOSPITAL_COMMUNITY)
Admission: RE | Disposition: A | Discharge status: Home / Self Care | Payer: Self-pay | Source: Home / Self Care | Attending: Cardiothoracic Surgery

## 2020-05-12 ENCOUNTER — Inpatient Hospital Stay (HOSPITAL_COMMUNITY): Payer: Medicare Other

## 2020-05-12 ENCOUNTER — Inpatient Hospital Stay (HOSPITAL_COMMUNITY): Payer: Medicare Other | Admitting: Anesthesiology

## 2020-05-12 ENCOUNTER — Encounter (HOSPITAL_COMMUNITY): Payer: Self-pay | Admitting: Cardiothoracic Surgery

## 2020-05-12 ENCOUNTER — Inpatient Hospital Stay (HOSPITAL_COMMUNITY)
Admission: RE | Admit: 2020-05-12 | Discharge: 2020-05-19 | DRG: 220 | Disposition: A | Discharge status: Home Health | Payer: Medicare Other | Attending: Cardiothoracic Surgery | Admitting: Cardiothoracic Surgery

## 2020-05-12 DIAGNOSIS — I454 Nonspecific intraventricular block: Secondary | ICD-10-CM | POA: Diagnosis not present

## 2020-05-12 DIAGNOSIS — D62 Acute posthemorrhagic anemia: Secondary | ICD-10-CM | POA: Diagnosis not present

## 2020-05-12 DIAGNOSIS — I10 Essential (primary) hypertension: Secondary | ICD-10-CM | POA: Diagnosis present

## 2020-05-12 DIAGNOSIS — M314 Aortic arch syndrome [Takayasu]: Secondary | ICD-10-CM | POA: Diagnosis not present

## 2020-05-12 DIAGNOSIS — I7121 Aneurysm of the ascending aorta, without rupture: Secondary | ICD-10-CM

## 2020-05-12 DIAGNOSIS — I4891 Unspecified atrial fibrillation: Secondary | ICD-10-CM | POA: Diagnosis not present

## 2020-05-12 DIAGNOSIS — D6959 Other secondary thrombocytopenia: Secondary | ICD-10-CM | POA: Diagnosis not present

## 2020-05-12 DIAGNOSIS — G4733 Obstructive sleep apnea (adult) (pediatric): Secondary | ICD-10-CM | POA: Diagnosis present

## 2020-05-12 DIAGNOSIS — E119 Type 2 diabetes mellitus without complications: Secondary | ICD-10-CM | POA: Diagnosis present

## 2020-05-12 DIAGNOSIS — I252 Old myocardial infarction: Secondary | ICD-10-CM

## 2020-05-12 DIAGNOSIS — E785 Hyperlipidemia, unspecified: Secondary | ICD-10-CM | POA: Diagnosis present

## 2020-05-12 DIAGNOSIS — J9811 Atelectasis: Secondary | ICD-10-CM | POA: Diagnosis not present

## 2020-05-12 DIAGNOSIS — Z8679 Personal history of other diseases of the circulatory system: Secondary | ICD-10-CM

## 2020-05-12 DIAGNOSIS — I712 Thoracic aortic aneurysm, without rupture: Principal | ICD-10-CM | POA: Diagnosis present

## 2020-05-12 DIAGNOSIS — I719 Aortic aneurysm of unspecified site, without rupture: Secondary | ICD-10-CM | POA: Diagnosis not present

## 2020-05-12 DIAGNOSIS — Z9989 Dependence on other enabling machines and devices: Secondary | ICD-10-CM | POA: Diagnosis not present

## 2020-05-12 DIAGNOSIS — I517 Cardiomegaly: Secondary | ICD-10-CM | POA: Diagnosis not present

## 2020-05-12 DIAGNOSIS — I350 Nonrheumatic aortic (valve) stenosis: Secondary | ICD-10-CM | POA: Diagnosis not present

## 2020-05-12 DIAGNOSIS — R0602 Shortness of breath: Secondary | ICD-10-CM | POA: Diagnosis not present

## 2020-05-12 DIAGNOSIS — Q231 Congenital insufficiency of aortic valve: Secondary | ICD-10-CM

## 2020-05-12 DIAGNOSIS — I214 Non-ST elevation (NSTEMI) myocardial infarction: Secondary | ICD-10-CM | POA: Diagnosis not present

## 2020-05-12 DIAGNOSIS — I358 Other nonrheumatic aortic valve disorders: Secondary | ICD-10-CM | POA: Diagnosis not present

## 2020-05-12 DIAGNOSIS — I2510 Atherosclerotic heart disease of native coronary artery without angina pectoris: Secondary | ICD-10-CM | POA: Diagnosis present

## 2020-05-12 DIAGNOSIS — J939 Pneumothorax, unspecified: Secondary | ICD-10-CM | POA: Diagnosis not present

## 2020-05-12 DIAGNOSIS — I493 Ventricular premature depolarization: Secondary | ICD-10-CM | POA: Diagnosis not present

## 2020-05-12 DIAGNOSIS — Z9889 Other specified postprocedural states: Secondary | ICD-10-CM

## 2020-05-12 DIAGNOSIS — Z20822 Contact with and (suspected) exposure to covid-19: Secondary | ICD-10-CM | POA: Diagnosis present

## 2020-05-12 DIAGNOSIS — R531 Weakness: Secondary | ICD-10-CM | POA: Diagnosis not present

## 2020-05-12 HISTORY — PX: BENTALL PROCEDURE: SHX5058

## 2020-05-12 HISTORY — PX: TEE WITHOUT CARDIOVERSION: SHX5443

## 2020-05-12 LAB — POCT I-STAT, CHEM 8
BUN: 25 mg/dL — ABNORMAL HIGH (ref 8–23)
BUN: 24 mg/dL — ABNORMAL HIGH (ref 8–23)
BUN: 24 mg/dL — ABNORMAL HIGH (ref 8–23)
BUN: 28 mg/dL — ABNORMAL HIGH (ref 8–23)
BUN: 27 mg/dL — ABNORMAL HIGH (ref 8–23)
Calcium, Ion: 1.28 mmol/L (ref 1.15–1.40)
Calcium, Ion: 1.12 mmol/L — ABNORMAL LOW (ref 1.15–1.40)
Calcium, Ion: 1.19 mmol/L (ref 1.15–1.40)
Calcium, Ion: 1.06 mmol/L — ABNORMAL LOW (ref 1.15–1.40)
Calcium, Ion: 1.13 mmol/L — ABNORMAL LOW (ref 1.15–1.40)
Chloride: 105 mmol/L (ref 98–111)
Chloride: 104 mmol/L (ref 98–111)
Chloride: 105 mmol/L (ref 98–111)
Chloride: 100 mmol/L (ref 98–111)
Chloride: 103 mmol/L (ref 98–111)
Creatinine, Ser: 0.9 mg/dL (ref 0.61–1.24)
Creatinine, Ser: 0.9 mg/dL (ref 0.61–1.24)
Creatinine, Ser: 1 mg/dL (ref 0.61–1.24)
Creatinine, Ser: 0.9 mg/dL (ref 0.61–1.24)
Creatinine, Ser: 0.7 mg/dL (ref 0.61–1.24)
Glucose, Bld: 100 mg/dL — ABNORMAL HIGH (ref 70–99)
Glucose, Bld: 106 mg/dL — ABNORMAL HIGH (ref 70–99)
Glucose, Bld: 108 mg/dL — ABNORMAL HIGH (ref 70–99)
Glucose, Bld: 162 mg/dL — ABNORMAL HIGH (ref 70–99)
Glucose, Bld: 132 mg/dL — ABNORMAL HIGH (ref 70–99)
HCT: 39 % (ref 39.0–52.0)
HCT: 27 % — ABNORMAL LOW (ref 39.0–52.0)
HCT: 32 % — ABNORMAL LOW (ref 39.0–52.0)
HCT: 26 % — ABNORMAL LOW (ref 39.0–52.0)
HCT: 27 % — ABNORMAL LOW (ref 39.0–52.0)
Hemoglobin: 13.3 g/dL (ref 13.0–17.0)
Hemoglobin: 10.9 g/dL — ABNORMAL LOW (ref 13.0–17.0)
Hemoglobin: 9.2 g/dL — ABNORMAL LOW (ref 13.0–17.0)
Hemoglobin: 8.8 g/dL — ABNORMAL LOW (ref 13.0–17.0)
Hemoglobin: 9.2 g/dL — ABNORMAL LOW (ref 13.0–17.0)
Potassium: 3.9 mmol/L (ref 3.5–5.1)
Potassium: 4.2 mmol/L (ref 3.5–5.1)
Potassium: 4.3 mmol/L (ref 3.5–5.1)
Potassium: 4.1 mmol/L (ref 3.5–5.1)
Potassium: 4.4 mmol/L (ref 3.5–5.1)
Sodium: 140 mmol/L (ref 135–145)
Sodium: 139 mmol/L (ref 135–145)
Sodium: 140 mmol/L (ref 135–145)
Sodium: 134 mmol/L — ABNORMAL LOW (ref 135–145)
Sodium: 138 mmol/L (ref 135–145)
TCO2: 24 mmol/L (ref 22–32)
TCO2: 24 mmol/L (ref 22–32)
TCO2: 26 mmol/L (ref 22–32)
TCO2: 23 mmol/L (ref 22–32)
TCO2: 23 mmol/L (ref 22–32)

## 2020-05-12 LAB — POCT I-STAT 7, (LYTES, BLD GAS, ICA,H+H)
Acid-Base Excess: 0 mmol/L (ref 0.0–2.0)
Acid-Base Excess: 1 mmol/L (ref 0.0–2.0)
Acid-Base Excess: 1 mmol/L (ref 0.0–2.0)
Acid-base deficit: 3 mmol/L — ABNORMAL HIGH (ref 0.0–2.0)
Acid-base deficit: 1 mmol/L (ref 0.0–2.0)
Acid-base deficit: 2 mmol/L (ref 0.0–2.0)
Acid-base deficit: 1 mmol/L (ref 0.0–2.0)
Acid-base deficit: 2 mmol/L (ref 0.0–2.0)
Bicarbonate: 26.2 mmol/L (ref 20.0–28.0)
Bicarbonate: 25.9 mmol/L (ref 20.0–28.0)
Bicarbonate: 22.7 mmol/L (ref 20.0–28.0)
Bicarbonate: 27 mmol/L (ref 20.0–28.0)
Bicarbonate: 24.2 mmol/L (ref 20.0–28.0)
Bicarbonate: 24 mmol/L (ref 20.0–28.0)
Bicarbonate: 24.3 mmol/L (ref 20.0–28.0)
Bicarbonate: 23.1 mmol/L (ref 20.0–28.0)
Calcium, Ion: 1.25 mmol/L (ref 1.15–1.40)
Calcium, Ion: 1.05 mmol/L — ABNORMAL LOW (ref 1.15–1.40)
Calcium, Ion: 1.03 mmol/L — ABNORMAL LOW (ref 1.15–1.40)
Calcium, Ion: 1.08 mmol/L — ABNORMAL LOW (ref 1.15–1.40)
Calcium, Ion: 1.16 mmol/L (ref 1.15–1.40)
Calcium, Ion: 1.11 mmol/L — ABNORMAL LOW (ref 1.15–1.40)
Calcium, Ion: 1.16 mmol/L (ref 1.15–1.40)
Calcium, Ion: 1.15 mmol/L (ref 1.15–1.40)
HCT: 36 % — ABNORMAL LOW (ref 39.0–52.0)
HCT: 29 % — ABNORMAL LOW (ref 39.0–52.0)
HCT: 25 % — ABNORMAL LOW (ref 39.0–52.0)
HCT: 27 % — ABNORMAL LOW (ref 39.0–52.0)
HCT: 26 % — ABNORMAL LOW (ref 39.0–52.0)
HCT: 30 % — ABNORMAL LOW (ref 39.0–52.0)
HCT: 26 % — ABNORMAL LOW (ref 39.0–52.0)
HCT: 26 % — ABNORMAL LOW (ref 39.0–52.0)
Hemoglobin: 12.2 g/dL — ABNORMAL LOW (ref 13.0–17.0)
Hemoglobin: 9.9 g/dL — ABNORMAL LOW (ref 13.0–17.0)
Hemoglobin: 8.5 g/dL — ABNORMAL LOW (ref 13.0–17.0)
Hemoglobin: 9.2 g/dL — ABNORMAL LOW (ref 13.0–17.0)
Hemoglobin: 8.8 g/dL — ABNORMAL LOW (ref 13.0–17.0)
Hemoglobin: 10.2 g/dL — ABNORMAL LOW (ref 13.0–17.0)
Hemoglobin: 8.8 g/dL — ABNORMAL LOW (ref 13.0–17.0)
Hemoglobin: 8.8 g/dL — ABNORMAL LOW (ref 13.0–17.0)
O2 Saturation: 100 %
O2 Saturation: 100 %
O2 Saturation: 100 %
O2 Saturation: 100 %
O2 Saturation: 99 %
O2 Saturation: 94 %
O2 Saturation: 95 %
O2 Saturation: 92 %
Patient temperature: 34.7
Patient temperature: 37.3
Patient temperature: 37.1
Potassium: 3.9 mmol/L (ref 3.5–5.1)
Potassium: 4.3 mmol/L (ref 3.5–5.1)
Potassium: 3.9 mmol/L (ref 3.5–5.1)
Potassium: 4.2 mmol/L (ref 3.5–5.1)
Potassium: 4.5 mmol/L (ref 3.5–5.1)
Potassium: 4.3 mmol/L (ref 3.5–5.1)
Potassium: 4.8 mmol/L (ref 3.5–5.1)
Potassium: 4.8 mmol/L (ref 3.5–5.1)
Sodium: 140 mmol/L (ref 135–145)
Sodium: 140 mmol/L (ref 135–145)
Sodium: 132 mmol/L — ABNORMAL LOW (ref 135–145)
Sodium: 139 mmol/L (ref 135–145)
Sodium: 138 mmol/L (ref 135–145)
Sodium: 140 mmol/L (ref 135–145)
Sodium: 141 mmol/L (ref 135–145)
Sodium: 141 mmol/L (ref 135–145)
TCO2: 28 mmol/L (ref 22–32)
TCO2: 27 mmol/L (ref 22–32)
TCO2: 24 mmol/L (ref 22–32)
TCO2: 29 mmol/L (ref 22–32)
TCO2: 26 mmol/L (ref 22–32)
TCO2: 25 mmol/L (ref 22–32)
TCO2: 26 mmol/L (ref 22–32)
TCO2: 24 mmol/L (ref 22–32)
pCO2 arterial: 47.4 mmHg (ref 32.0–48.0)
pCO2 arterial: 43.4 mmHg (ref 32.0–48.0)
pCO2 arterial: 45 mmHg (ref 32.0–48.0)
pCO2 arterial: 49 mmHg — ABNORMAL HIGH (ref 32.0–48.0)
pCO2 arterial: 43.9 mmHg (ref 32.0–48.0)
pCO2 arterial: 40.4 mmHg (ref 32.0–48.0)
pCO2 arterial: 45.4 mmHg (ref 32.0–48.0)
pCO2 arterial: 40.5 mmHg (ref 32.0–48.0)
pH, Arterial: 7.351 (ref 7.350–7.450)
pH, Arterial: 7.384 (ref 7.350–7.450)
pH, Arterial: 7.311 — ABNORMAL LOW (ref 7.350–7.450)
pH, Arterial: 7.35 (ref 7.350–7.450)
pH, Arterial: 7.35 (ref 7.350–7.450)
pH, Arterial: 7.371 (ref 7.350–7.450)
pH, Arterial: 7.338 — ABNORMAL LOW (ref 7.350–7.450)
pH, Arterial: 7.365 (ref 7.350–7.450)
pO2, Arterial: 304 mmHg — ABNORMAL HIGH (ref 83.0–108.0)
pO2, Arterial: 391 mmHg — ABNORMAL HIGH (ref 83.0–108.0)
pO2, Arterial: 375 mmHg — ABNORMAL HIGH (ref 83.0–108.0)
pO2, Arterial: 425 mmHg — ABNORMAL HIGH (ref 83.0–108.0)
pO2, Arterial: 135 mmHg — ABNORMAL HIGH (ref 83.0–108.0)
pO2, Arterial: 63 mmHg — ABNORMAL LOW (ref 83.0–108.0)
pO2, Arterial: 83 mmHg (ref 83.0–108.0)
pO2, Arterial: 67 mmHg — ABNORMAL LOW (ref 83.0–108.0)

## 2020-05-12 LAB — PLATELET COUNT: Platelets: 99 10*3/uL — ABNORMAL LOW (ref 150–400)

## 2020-05-12 LAB — CBC
HCT: 28.3 % — ABNORMAL LOW (ref 39.0–52.0)
HCT: 27.1 % — ABNORMAL LOW (ref 39.0–52.0)
Hemoglobin: 10 g/dL — ABNORMAL LOW (ref 13.0–17.0)
Hemoglobin: 9.6 g/dL — ABNORMAL LOW (ref 13.0–17.0)
MCH: 33.4 pg (ref 26.0–34.0)
MCH: 33.3 pg (ref 26.0–34.0)
MCHC: 35.3 g/dL (ref 30.0–36.0)
MCHC: 35.4 g/dL (ref 30.0–36.0)
MCV: 94.6 fL (ref 80.0–100.0)
MCV: 94.1 fL (ref 80.0–100.0)
Platelets: 135 10*3/uL — ABNORMAL LOW (ref 150–400)
Platelets: 134 10*3/uL — ABNORMAL LOW (ref 150–400)
RBC: 2.99 MIL/uL — ABNORMAL LOW (ref 4.22–5.81)
RBC: 2.88 MIL/uL — ABNORMAL LOW (ref 4.22–5.81)
RDW: 14 % (ref 11.5–15.5)
RDW: 13.8 % (ref 11.5–15.5)
WBC: 8.4 10*3/uL (ref 4.0–10.5)
WBC: 6.8 10*3/uL (ref 4.0–10.5)
nRBC: 0 % (ref 0.0–0.2)
nRBC: 0 % (ref 0.0–0.2)

## 2020-05-12 LAB — HEMOGLOBIN AND HEMATOCRIT, BLOOD
HCT: 25.4 % — ABNORMAL LOW (ref 39.0–52.0)
Hemoglobin: 8.7 g/dL — ABNORMAL LOW (ref 13.0–17.0)

## 2020-05-12 LAB — POCT I-STAT EG7
Acid-Base Excess: 0 mmol/L (ref 0.0–2.0)
Bicarbonate: 25.9 mmol/L (ref 20.0–28.0)
Calcium, Ion: 1.11 mmol/L — ABNORMAL LOW (ref 1.15–1.40)
HCT: 29 % — ABNORMAL LOW (ref 39.0–52.0)
Hemoglobin: 9.9 g/dL — ABNORMAL LOW (ref 13.0–17.0)
O2 Saturation: 80 %
Potassium: 4.1 mmol/L (ref 3.5–5.1)
Sodium: 141 mmol/L (ref 135–145)
TCO2: 27 mmol/L (ref 22–32)
pCO2, Ven: 46.7 mmHg (ref 44.0–60.0)
pH, Ven: 7.352 (ref 7.250–7.430)
pO2, Ven: 47 mmHg — ABNORMAL HIGH (ref 32.0–45.0)

## 2020-05-12 LAB — MAGNESIUM: Magnesium: 3.2 mg/dL — ABNORMAL HIGH (ref 1.7–2.4)

## 2020-05-12 LAB — BASIC METABOLIC PANEL
Anion gap: 6 (ref 5–15)
BUN: 22 mg/dL (ref 8–23)
CO2: 23 mmol/L (ref 22–32)
Calcium: 7.7 mg/dL — ABNORMAL LOW (ref 8.9–10.3)
Chloride: 107 mmol/L (ref 98–111)
Creatinine, Ser: 1.04 mg/dL (ref 0.61–1.24)
GFR, Estimated: >60 mL/min (ref >60)
Glucose, Bld: 121 mg/dL — ABNORMAL HIGH (ref 70–99)
Potassium: 4.7 mmol/L (ref 3.5–5.1)
Sodium: 136 mmol/L (ref 135–145)

## 2020-05-12 LAB — GLUCOSE, CAPILLARY
Glucose-Capillary: 138 mg/dL — ABNORMAL HIGH (ref 70–99)
Glucose-Capillary: 137 mg/dL — ABNORMAL HIGH (ref 70–99)
Glucose-Capillary: 127 mg/dL — ABNORMAL HIGH (ref 70–99)
Glucose-Capillary: 119 mg/dL — ABNORMAL HIGH (ref 70–99)
Glucose-Capillary: 123 mg/dL — ABNORMAL HIGH (ref 70–99)
Glucose-Capillary: 146 mg/dL — ABNORMAL HIGH (ref 70–99)
Glucose-Capillary: 116 mg/dL — ABNORMAL HIGH (ref 70–99)

## 2020-05-12 LAB — PROTIME-INR
INR: 1.4 — ABNORMAL HIGH (ref 0.8–1.2)
Prothrombin Time: 16.6 seconds — ABNORMAL HIGH (ref 11.4–15.2)

## 2020-05-12 LAB — ABO/RH: ABO/RH(D): A POS

## 2020-05-12 LAB — PREPARE RBC (CROSSMATCH)

## 2020-05-12 LAB — APTT: aPTT: 33 seconds (ref 24–36)

## 2020-05-12 LAB — FIBRINOGEN: Fibrinogen: 203 mg/dL — ABNORMAL LOW (ref 210–475)

## 2020-05-12 SURGERY — BENTALL PROCEDURE
Anesthesia: General | Site: Chest

## 2020-05-12 MED ORDER — PHENYLEPHRINE 40 MCG/ML (10ML) SYRINGE FOR IV PUSH (FOR BLOOD PRESSURE SUPPORT)
PREFILLED_SYRINGE | INTRAVENOUS | Status: AC
  Filled 2020-05-12: qty 10

## 2020-05-12 MED ORDER — HEPARIN SODIUM (PORCINE) 1000 UNIT/ML IJ SOLN
INTRAMUSCULAR | Status: AC
  Filled 2020-05-12: qty 1

## 2020-05-12 MED ORDER — BISACODYL 5 MG PO TBEC
10.0000 mg | DELAYED_RELEASE_TABLET | Freq: Every day | ORAL | Status: DC
  Administered 2020-05-13 – 2020-05-19 (×7): 10 mg via ORAL
  Filled 2020-05-12 (×7): qty 2

## 2020-05-12 MED ORDER — PANTOPRAZOLE SODIUM 40 MG PO TBEC
40.0000 mg | DELAYED_RELEASE_TABLET | Freq: Every day | ORAL | Status: DC
  Administered 2020-05-14 – 2020-05-19 (×6): 40 mg via ORAL
  Filled 2020-05-12 (×6): qty 1

## 2020-05-12 MED ORDER — SODIUM CHLORIDE 0.9 % IV SOLN: 250.0000 mL | INTRAVENOUS | Status: DC

## 2020-05-12 MED ORDER — SODIUM CHLORIDE 0.9% FLUSH
3.0000 mL | Freq: Two times a day (BID) | INTRAVENOUS | Status: DC
  Administered 2020-05-13 – 2020-05-15 (×3): 3 mL via INTRAVENOUS

## 2020-05-12 MED ORDER — SODIUM CHLORIDE 0.9 % IV SOLN
20.0000 ug | Freq: Once | INTRAVENOUS | Status: AC
  Administered 2020-05-12: 20 ug via INTRAVENOUS
  Filled 2020-05-12 (×3): qty 5

## 2020-05-12 MED ORDER — DEXTROSE 50 % IV SOLN: 0.0000 mL | INTRAVENOUS | Status: DC | PRN

## 2020-05-12 MED ORDER — LACTATED RINGERS IV SOLN: INTRAVENOUS | Status: DC

## 2020-05-12 MED ORDER — TRANEXAMIC ACID 1000 MG/10ML IV SOLN
1.5000 mg/kg/h | INTRAVENOUS | Status: DC
  Administered 2020-05-12: 1.5 mg/kg/h via INTRAVENOUS
  Filled 2020-05-12: qty 25

## 2020-05-12 MED ORDER — LACTATED RINGERS IV SOLN: INTRAVENOUS | Status: DC | PRN

## 2020-05-12 MED ORDER — PROPOFOL 10 MG/ML IV BOLUS
INTRAVENOUS | Status: AC
  Filled 2020-05-12: qty 20

## 2020-05-12 MED ORDER — VANCOMYCIN HCL 1000 MG IV SOLR
INTRAVENOUS | Status: DC | PRN
  Administered 2020-05-12: 3000 mg

## 2020-05-12 MED ORDER — MIDAZOLAM HCL (PF) 10 MG/2ML IJ SOLN
INTRAMUSCULAR | Status: AC
  Filled 2020-05-12: qty 2

## 2020-05-12 MED ORDER — PROTAMINE SULFATE 10 MG/ML IV SOLN
INTRAVENOUS | Status: AC
  Filled 2020-05-12: qty 25

## 2020-05-12 MED ORDER — HYDROCORTISONE NA SUCCINATE PF 100 MG IJ SOLR
INTRAMUSCULAR | Status: DC | PRN
  Administered 2020-05-12: 125 mg via INTRAVENOUS

## 2020-05-12 MED ORDER — ASPIRIN 81 MG PO CHEW: 324.0000 mg | CHEWABLE_TABLET | Freq: Every day | ORAL | Status: DC

## 2020-05-12 MED ORDER — DEXMEDETOMIDINE HCL IN NACL 400 MCG/100ML IV SOLN
0.0000 ug/kg/h | INTRAVENOUS | Status: DC
  Administered 2020-05-12 (×2): 0.7 ug/kg/h via INTRAVENOUS
  Filled 2020-05-12: qty 100

## 2020-05-12 MED ORDER — TRAMADOL HCL 50 MG PO TABS
50.0000 mg | ORAL_TABLET | ORAL | Status: DC | PRN
  Administered 2020-05-15 – 2020-05-18 (×5): 100 mg via ORAL
  Filled 2020-05-12 (×6): qty 2

## 2020-05-12 MED ORDER — FENTANYL CITRATE (PF) 250 MCG/5ML IJ SOLN
INTRAMUSCULAR | Status: AC
  Filled 2020-05-12: qty 25

## 2020-05-12 MED ORDER — PLATELET POOR PLASMA OPTIME
Status: DC | PRN
  Administered 2020-05-12: 10 mL

## 2020-05-12 MED ORDER — ASPIRIN EC 325 MG PO TBEC
325.0000 mg | DELAYED_RELEASE_TABLET | Freq: Every day | ORAL | Status: DC
  Administered 2020-05-13 – 2020-05-19 (×7): 325 mg via ORAL
  Filled 2020-05-12 (×7): qty 1

## 2020-05-12 MED ORDER — SODIUM CHLORIDE 0.45 % IV SOLN: INTRAVENOUS | Status: DC | PRN

## 2020-05-12 MED ORDER — DOCUSATE SODIUM 100 MG PO CAPS
200.0000 mg | ORAL_CAPSULE | Freq: Every day | ORAL | Status: DC
  Administered 2020-05-13 – 2020-05-19 (×7): 200 mg via ORAL
  Filled 2020-05-12 (×7): qty 2

## 2020-05-12 MED ORDER — SODIUM CHLORIDE 0.9% IV SOLUTION: Freq: Once | INTRAVENOUS | Status: AC

## 2020-05-12 MED ORDER — INSULIN REGULAR(HUMAN) IN NACL 100-0.9 UT/100ML-% IV SOLN
INTRAVENOUS | Status: DC
  Administered 2020-05-12: 1.8 [IU]/h via INTRAVENOUS

## 2020-05-12 MED ORDER — SODIUM CHLORIDE 0.9% FLUSH: 3.0000 mL | INTRAVENOUS | Status: DC | PRN

## 2020-05-12 MED ORDER — ALBUMIN HUMAN 5 % IV SOLN
250.0000 mL | INTRAVENOUS | Status: DC | PRN
  Administered 2020-05-12 (×2): 12.5 g via INTRAVENOUS

## 2020-05-12 MED ORDER — METOPROLOL TARTRATE 5 MG/5ML IV SOLN
2.5000 mg | INTRAVENOUS | Status: DC | PRN
  Administered 2020-05-16: 5 mg via INTRAVENOUS
  Administered 2020-05-17 (×4): 2.5 mg via INTRAVENOUS
  Filled 2020-05-12 (×7): qty 5

## 2020-05-12 MED ORDER — MORPHINE SULFATE (PF) 2 MG/ML IV SOLN
1.0000 mg | INTRAVENOUS | Status: DC | PRN
  Administered 2020-05-12: 2 mg via INTRAVENOUS
  Administered 2020-05-12: 1 mg via INTRAVENOUS
  Administered 2020-05-13 – 2020-05-14 (×4): 2 mg via INTRAVENOUS
  Filled 2020-05-12 (×6): qty 1

## 2020-05-12 MED ORDER — MIDAZOLAM HCL 5 MG/5ML IJ SOLN
INTRAMUSCULAR | Status: DC | PRN
  Administered 2020-05-12: 2 mg via INTRAVENOUS
  Administered 2020-05-12: 1 mg via INTRAVENOUS
  Administered 2020-05-12: 4 mg via INTRAVENOUS
  Administered 2020-05-12: 1 mg via INTRAVENOUS
  Administered 2020-05-12: 2 mg via INTRAVENOUS

## 2020-05-12 MED ORDER — FAMOTIDINE IN NACL 20-0.9 MG/50ML-% IV SOLN
20.0000 mg | Freq: Two times a day (BID) | INTRAVENOUS | Status: DC
  Administered 2020-05-12: 20 mg via INTRAVENOUS
  Filled 2020-05-12 (×2): qty 50

## 2020-05-12 MED ORDER — ROCURONIUM BROMIDE 100 MG/10ML IV SOLN
INTRAVENOUS | Status: DC | PRN
  Administered 2020-05-12: 30 mg via INTRAVENOUS
  Administered 2020-05-12: 50 mg via INTRAVENOUS
  Administered 2020-05-12: 100 mg via INTRAVENOUS
  Administered 2020-05-12: 50 mg via INTRAVENOUS

## 2020-05-12 MED ORDER — HEPARIN SODIUM (PORCINE) 1000 UNIT/ML IJ SOLN
INTRAMUSCULAR | Status: DC | PRN
  Administered 2020-05-12: 30000 [IU] via INTRAVENOUS
  Administered 2020-05-12: 5000 [IU] via INTRAVENOUS

## 2020-05-12 MED ORDER — MAGNESIUM SULFATE 4 GM/100ML IV SOLN
4.0000 g | Freq: Once | INTRAVENOUS | Status: AC
  Administered 2020-05-12: 4 g via INTRAVENOUS
  Filled 2020-05-12: qty 100

## 2020-05-12 MED ORDER — PLASMA-LYTE A IV SOLN: INTRAVENOUS | Status: DC

## 2020-05-12 MED ORDER — CHLORHEXIDINE GLUCONATE 0.12% ORAL RINSE (MEDLINE KIT): 15.0000 mL | Freq: Two times a day (BID) | OROMUCOSAL | Status: DC

## 2020-05-12 MED ORDER — BISACODYL 10 MG RE SUPP
10.0000 mg | Freq: Every day | RECTAL | Status: DC
  Filled 2020-05-12: qty 1

## 2020-05-12 MED ORDER — THROMBIN 5000 UNITS EX SOLR
INTRAVENOUS | Status: DC | PRN
  Administered 2020-05-12: 2 mL

## 2020-05-12 MED ORDER — EPHEDRINE 5 MG/ML INJ
INTRAVENOUS | Status: AC
  Filled 2020-05-12: qty 10

## 2020-05-12 MED ORDER — METOPROLOL TARTRATE 12.5 MG HALF TABLET: 12.5000 mg | ORAL_TABLET | Freq: Two times a day (BID) | ORAL | Status: DC

## 2020-05-12 MED ORDER — HEMOSTATIC AGENTS (NO CHARGE) OPTIME
TOPICAL | Status: DC | PRN
  Administered 2020-05-12 (×4): 1 via TOPICAL

## 2020-05-12 MED ORDER — PLATELET RICH PLASMA OPTIME
Status: DC | PRN
  Administered 2020-05-12: 10 mL

## 2020-05-12 MED ORDER — EPHEDRINE SULFATE-NACL 50-0.9 MG/10ML-% IV SOSY
PREFILLED_SYRINGE | INTRAVENOUS | Status: DC | PRN
  Administered 2020-05-12 (×4): 10 mg via INTRAVENOUS

## 2020-05-12 MED ORDER — NITROGLYCERIN IN D5W 200-5 MCG/ML-% IV SOLN
0.0000 ug/min | INTRAVENOUS | Status: DC
  Administered 2020-05-12: 0 ug/min via INTRAVENOUS

## 2020-05-12 MED ORDER — VANCOMYCIN HCL 1000 MG IV SOLR
INTRAVENOUS | Status: AC
  Filled 2020-05-12: qty 3000

## 2020-05-12 MED ORDER — PROTAMINE SULFATE 10 MG/ML IV SOLN
INTRAVENOUS | Status: DC | PRN
  Administered 2020-05-12: 290 mg via INTRAVENOUS
  Administered 2020-05-12: 10 mg via INTRAVENOUS

## 2020-05-12 MED ORDER — LACTATED RINGERS IV SOLN: 500.0000 mL | Freq: Once | INTRAVENOUS | Status: DC | PRN

## 2020-05-12 MED ORDER — CHLORHEXIDINE GLUCONATE CLOTH 2 % EX PADS
6.0000 | MEDICATED_PAD | Freq: Every day | CUTANEOUS | Status: DC
  Administered 2020-05-12 – 2020-05-17 (×6): 6 via TOPICAL

## 2020-05-12 MED ORDER — VANCOMYCIN HCL IN DEXTROSE 1-5 GM/200ML-% IV SOLN
1000.0000 mg | Freq: Once | INTRAVENOUS | Status: AC
  Administered 2020-05-12: 1000 mg via INTRAVENOUS
  Filled 2020-05-12: qty 200

## 2020-05-12 MED ORDER — HYDROCORTISONE NA SUCCINATE PF 250 MG IJ SOLR
INTRAMUSCULAR | Status: AC
  Filled 2020-05-12: qty 250

## 2020-05-12 MED ORDER — ONDANSETRON HCL 4 MG/2ML IJ SOLN
4.0000 mg | Freq: Four times a day (QID) | INTRAMUSCULAR | Status: DC | PRN
  Administered 2020-05-12: 4 mg via INTRAVENOUS
  Filled 2020-05-12: qty 2

## 2020-05-12 MED ORDER — FENTANYL CITRATE (PF) 250 MCG/5ML IJ SOLN
INTRAMUSCULAR | Status: DC | PRN
  Administered 2020-05-12: 100 ug via INTRAVENOUS
  Administered 2020-05-12 (×2): 50 ug via INTRAVENOUS
  Administered 2020-05-12 (×2): 150 ug via INTRAVENOUS
  Administered 2020-05-12: 250 ug via INTRAVENOUS
  Administered 2020-05-12: 100 ug via INTRAVENOUS
  Administered 2020-05-12: 150 ug via INTRAVENOUS

## 2020-05-12 MED ORDER — PHENYLEPHRINE HCL-NACL 20-0.9 MG/250ML-% IV SOLN
0.0000 ug/min | INTRAVENOUS | Status: DC
  Administered 2020-05-12 – 2020-05-13 (×2): 15 ug/min via INTRAVENOUS
  Filled 2020-05-12: qty 250

## 2020-05-12 MED ORDER — POTASSIUM CHLORIDE 10 MEQ/50ML IV SOLN: 10.0000 meq | INTRAVENOUS | Status: AC

## 2020-05-12 MED ORDER — PROPOFOL 500 MG/50ML IV EMUL
INTRAVENOUS | Status: DC | PRN
  Administered 2020-05-12: 120 mg via INTRAVENOUS
  Administered 2020-05-12: 80 mg via INTRAVENOUS

## 2020-05-12 MED ORDER — LIDOCAINE HCL (CARDIAC) PF 100 MG/5ML IV SOSY
PREFILLED_SYRINGE | INTRAVENOUS | Status: DC | PRN
  Administered 2020-05-12: 80 mg via INTRATRACHEAL

## 2020-05-12 MED ORDER — SODIUM CHLORIDE 0.9 % IV SOLN: INTRAVENOUS | Status: DC

## 2020-05-12 MED ORDER — LIDOCAINE HCL (PF) 2 % IJ SOLN
INTRAMUSCULAR | Status: AC
  Filled 2020-05-12: qty 5

## 2020-05-12 MED ORDER — SODIUM CHLORIDE 0.9% FLUSH
10.0000 mL | Freq: Two times a day (BID) | INTRAVENOUS | Status: DC
  Administered 2020-05-12 – 2020-05-14 (×3): 10 mL

## 2020-05-12 MED ORDER — ALBUMIN HUMAN 5 % IV SOLN: INTRAVENOUS | Status: DC | PRN

## 2020-05-12 MED ORDER — CHLORHEXIDINE GLUCONATE 0.12 % MT SOLN
OROMUCOSAL | Status: AC
  Administered 2020-05-12: 15 mL via OROMUCOSAL
  Filled 2020-05-12: qty 15

## 2020-05-12 MED ORDER — CHLORHEXIDINE GLUCONATE 0.12 % MT SOLN: 15.0000 mL | Freq: Once | OROMUCOSAL | Status: AC

## 2020-05-12 MED ORDER — PROTAMINE SULFATE 10 MG/ML IV SOLN
INTRAVENOUS | Status: AC
  Filled 2020-05-12: qty 5

## 2020-05-12 MED ORDER — CHLORHEXIDINE GLUCONATE 0.12 % MT SOLN
15.0000 mL | OROMUCOSAL | Status: AC
  Administered 2020-05-12: 15 mL via OROMUCOSAL

## 2020-05-12 MED ORDER — 0.9 % SODIUM CHLORIDE (POUR BTL) OPTIME
TOPICAL | Status: DC | PRN
  Administered 2020-05-12: 5000 mL

## 2020-05-12 MED ORDER — CHLORHEXIDINE GLUCONATE 4 % EX LIQD: 30.0000 mL | CUTANEOUS | Status: DC

## 2020-05-12 MED ORDER — MIDAZOLAM HCL 2 MG/2ML IJ SOLN: 2.0000 mg | INTRAMUSCULAR | Status: DC | PRN

## 2020-05-12 MED ORDER — METOPROLOL TARTRATE 12.5 MG HALF TABLET: 12.5000 mg | ORAL_TABLET | Freq: Once | ORAL | Status: DC

## 2020-05-12 MED ORDER — ORAL CARE MOUTH RINSE
15.0000 mL | OROMUCOSAL | Status: DC
  Administered 2020-05-12 (×2): 15 mL via OROMUCOSAL

## 2020-05-12 MED ORDER — OXYCODONE HCL 5 MG PO TABS
5.0000 mg | ORAL_TABLET | ORAL | Status: DC | PRN
  Administered 2020-05-12: 5 mg via ORAL
  Administered 2020-05-13 – 2020-05-14 (×8): 10 mg via ORAL
  Administered 2020-05-15: 5 mg via ORAL
  Administered 2020-05-15 – 2020-05-16 (×3): 10 mg via ORAL
  Administered 2020-05-16: 5 mg via ORAL
  Administered 2020-05-16 – 2020-05-18 (×8): 10 mg via ORAL
  Administered 2020-05-19: 5 mg via ORAL
  Filled 2020-05-12 (×2): qty 1
  Filled 2020-05-12 (×17): qty 2
  Filled 2020-05-12: qty 1
  Filled 2020-05-12 (×3): qty 2

## 2020-05-12 MED ORDER — ORAL CARE MOUTH RINSE
15.0000 mL | Freq: Two times a day (BID) | OROMUCOSAL | Status: DC
  Administered 2020-05-12 – 2020-05-18 (×11): 15 mL via OROMUCOSAL

## 2020-05-12 MED ORDER — PLASMA-LYTE 148 IV SOLN
INTRAVENOUS | Status: DC | PRN
  Administered 2020-05-12: 500 mL via INTRAVASCULAR

## 2020-05-12 MED ORDER — LIDOCAINE HCL (CARDIAC) PF 100 MG/5ML IV SOSY: PREFILLED_SYRINGE | INTRAVENOUS | Status: DC | PRN

## 2020-05-12 MED ORDER — METOPROLOL TARTRATE 25 MG/10 ML ORAL SUSPENSION: 12.5000 mg | Freq: Two times a day (BID) | ORAL | Status: DC

## 2020-05-12 MED ORDER — SODIUM CHLORIDE 0.9% FLUSH: 10.0000 mL | INTRAVENOUS | Status: DC | PRN

## 2020-05-12 MED ORDER — SODIUM CHLORIDE 0.9 % IV SOLN
1.5000 g | Freq: Two times a day (BID) | INTRAVENOUS | Status: AC
  Administered 2020-05-12 – 2020-05-14 (×4): 1.5 g via INTRAVENOUS
  Filled 2020-05-12 (×4): qty 1.5

## 2020-05-12 MED ORDER — ROCURONIUM BROMIDE 10 MG/ML (PF) SYRINGE
PREFILLED_SYRINGE | INTRAVENOUS | Status: AC
  Filled 2020-05-12: qty 20

## 2020-05-12 SURGICAL SUPPLY — 105 items
ADAPTER CARDIO PERF ANTE/RETRO (ADAPTER) ×3 IMPLANT
ADH SKN CLS APL DERMABOND .7 (GAUZE/BANDAGES/DRESSINGS) ×6
ADPR PRFSN 84XANTGRD RTRGD (ADAPTER) ×2
AGENT HMST 10 BLLW SHRT CANN (HEMOSTASIS) ×2
APL SRG 7X2 LUM MLBL SLNT (VASCULAR PRODUCTS)
APPLICATOR TIP COSEAL (VASCULAR PRODUCTS) IMPLANT
BAG DECANTER FOR FLEXI CONT (MISCELLANEOUS) ×3 IMPLANT
BLADE CLIPPER SURG (BLADE) ×3 IMPLANT
BLADE STERNUM SYSTEM 6 (BLADE) ×3 IMPLANT
BLADE SURG 15 STRL LF DISP TIS (BLADE) ×2 IMPLANT
BLADE SURG 15 STRL SS (BLADE) ×3
CANISTER SUCT 3000ML PPV (MISCELLANEOUS) ×3 IMPLANT
CANNULA SUMP PERICARDIAL (CANNULA) ×1 IMPLANT
CATH CPB KIT HENDRICKSON (MISCELLANEOUS) ×3 IMPLANT
CATH HEART VENT LEFT (CATHETERS) ×2 IMPLANT
CATH RETROPLEGIA CORONARY 14FR (CATHETERS) ×4 IMPLANT
CATH ROBINSON RED A/P 18FR (CATHETERS) ×9 IMPLANT
CAUTERY HIGH TEMP VAS (MISCELLANEOUS) ×3 IMPLANT
CLIP VESOCCLUDE LG 6/CT (CLIP) ×1 IMPLANT
CNTNR URN SCR LID CUP LEK RST (MISCELLANEOUS) ×2 IMPLANT
CONT SPEC 4OZ STRL OR WHT (MISCELLANEOUS) ×6
COVER MAYO STAND STRL (DRAPES) ×1 IMPLANT
DERMABOND ADVANCED (GAUZE/BANDAGES/DRESSINGS) ×3
DERMABOND ADVANCED .7 DNX12 (GAUZE/BANDAGES/DRESSINGS) IMPLANT
DEVICE SUT CK QUICK LOAD INDV (Prosthesis & Implant Heart) ×4 IMPLANT
DRAPE SLUSH/WARMER DISC (DRAPES) IMPLANT
DRSG AQUACEL AG ADV 3.5X 6 (GAUZE/BANDAGES/DRESSINGS) ×1 IMPLANT
DRSG AQUACEL AG ADV 3.5X14 (GAUZE/BANDAGES/DRESSINGS) ×1 IMPLANT
DRSG COVADERM 4X14 (GAUZE/BANDAGES/DRESSINGS) ×3 IMPLANT
ELECT CAUTERY BLADE 6.4 (BLADE) ×3 IMPLANT
ELECT REM PT RETURN 9FT ADLT (ELECTROSURGICAL) ×6
ELECTRODE REM PT RTRN 9FT ADLT (ELECTROSURGICAL) ×4 IMPLANT
FELT TEFLON 1X6 (MISCELLANEOUS) ×3 IMPLANT
FELT TEFLON 6X6 (MISCELLANEOUS) ×1 IMPLANT
GAUZE SPONGE 4X4 12PLY STRL (GAUZE/BANDAGES/DRESSINGS) ×3 IMPLANT
GAUZE SPONGE 4X4 12PLY STRL LF (GAUZE/BANDAGES/DRESSINGS) ×1 IMPLANT
GLOVE BIO SURGEON STRL SZ 6 (GLOVE) IMPLANT
GLOVE BIO SURGEON STRL SZ 6.5 (GLOVE) ×5 IMPLANT
GLOVE BIO SURGEON STRL SZ7 (GLOVE) IMPLANT
GLOVE BIO SURGEON STRL SZ7.5 (GLOVE) ×4 IMPLANT
GLOVE NEODERM STRL 7.5  LF PF (GLOVE) ×4
GLOVE NEODERM STRL 7.5 LF PF (GLOVE) ×4 IMPLANT
GLOVE SURG NEODERM 7.5  LF PF (GLOVE) ×2
GLOVE SURG UNDER POLY LF SZ7 (GLOVE) ×2 IMPLANT
GOWN STRL REUS W/ TWL LRG LVL3 (GOWN DISPOSABLE) ×8 IMPLANT
GOWN STRL REUS W/TWL LRG LVL3 (GOWN DISPOSABLE) ×30
GRAFT HEMASHIELD 10MM (Graft) ×3 IMPLANT
GRAFT VASC STRG 30X10STRL (Graft) IMPLANT
GRAFT WOVEN D/V 30DX30L (Vascular Products) ×1 IMPLANT
HEMOSTAT HEMOBLAST BELLOWS (HEMOSTASIS) ×1 IMPLANT
HEMOSTAT POWDER SURGIFOAM 1G (HEMOSTASIS) ×9 IMPLANT
HEMOSTAT SURGICEL 2X14 (HEMOSTASIS) ×3 IMPLANT
INSERT FOGARTY SM (MISCELLANEOUS) ×1 IMPLANT
INSERT FOGARTY XLG (MISCELLANEOUS) ×1 IMPLANT
INSERT SUTURE HOLDER (MISCELLANEOUS) ×3 IMPLANT
KIT APPLICATOR RATIO 11:1 (KITS) ×1 IMPLANT
KIT BASIN OR (CUSTOM PROCEDURE TRAY) ×3 IMPLANT
KIT SUCTION CATH 14FR (SUCTIONS) ×4 IMPLANT
KIT SUT CK MINI COMBO 4X17 (Prosthesis & Implant Heart) ×1 IMPLANT
KIT TURNOVER KIT B (KITS) ×3 IMPLANT
LINE VENT (MISCELLANEOUS) ×1 IMPLANT
NS IRRIG 1000ML POUR BTL (IV SOLUTION) ×15 IMPLANT
PACK E OPEN HEART (SUTURE) ×3 IMPLANT
PACK OPEN HEART (CUSTOM PROCEDURE TRAY) ×3 IMPLANT
PACK PLATELET PROCEDURE 60 (MISCELLANEOUS) ×1 IMPLANT
PAD ARMBOARD 7.5X6 YLW CONV (MISCELLANEOUS) ×6 IMPLANT
POSITIONER HEAD DONUT 9IN (MISCELLANEOUS) ×3 IMPLANT
POWDER SURGICEL 3.0 GRAM (HEMOSTASIS) ×3 IMPLANT
SEALANT SURG COSEAL 8ML (VASCULAR PRODUCTS) ×3 IMPLANT
SET CARDIOPLEGIA MPS 5001102 (MISCELLANEOUS) ×1 IMPLANT
STAPLER VISISTAT 35W (STAPLE) ×1 IMPLANT
SUT BONE WAX W31G (SUTURE) ×3 IMPLANT
SUT ETHIBON 2 0 V 52N 30 (SUTURE) ×6 IMPLANT
SUT ETHIBON EXCEL 2-0 V-5 (SUTURE) IMPLANT
SUT ETHIBOND V-5 VALVE (SUTURE) IMPLANT
SUT PROLENE 3 0 SH 1 (SUTURE) ×3 IMPLANT
SUT PROLENE 3 0 SH 48 (SUTURE) ×9 IMPLANT
SUT PROLENE 3 0 SH DA (SUTURE) ×2 IMPLANT
SUT PROLENE 4 0 RB 1 (SUTURE) ×6
SUT PROLENE 4 0 SH DA (SUTURE) ×2 IMPLANT
SUT PROLENE 4-0 RB1 .5 CRCL 36 (SUTURE) ×8 IMPLANT
SUT PROLENE 5 0 C 1 36 (SUTURE) ×1 IMPLANT
SUT STEEL 6MS V (SUTURE) IMPLANT
SUT STEEL STERNAL CCS#1 18IN (SUTURE) IMPLANT
SUT STEEL SZ 6 DBL 3X14 BALL (SUTURE) IMPLANT
SUT VIC AB 1 CTX 36 (SUTURE) ×6
SUT VIC AB 1 CTX36XBRD ANBCTR (SUTURE) ×4 IMPLANT
SUT VIC AB 2-0 CT1 27 (SUTURE) ×3
SUT VIC AB 2-0 CT1 TAPERPNT 27 (SUTURE) IMPLANT
SUT VIC AB 3-0 X1 27 (SUTURE) IMPLANT
SYSTEM SAHARA CHEST DRAIN ATS (WOUND CARE) ×3 IMPLANT
TAPE CLOTH SURG 4X10 WHT LF (GAUZE/BANDAGES/DRESSINGS) ×1 IMPLANT
TAPE PAPER 2X10 WHT MICROPORE (GAUZE/BANDAGES/DRESSINGS) ×1 IMPLANT
TIP DUAL SPRAY TOPICAL (TIP) ×2 IMPLANT
TOWEL GREEN STERILE (TOWEL DISPOSABLE) ×3 IMPLANT
TOWEL GREEN STERILE FF (TOWEL DISPOSABLE) ×3 IMPLANT
TRAY FOLEY SLVR 14FR TEMP STAT (SET/KITS/TRAYS/PACK) ×3 IMPLANT
TUBE CONNECTING 12X1/4 (SUCTIONS) ×1 IMPLANT
TUBING ART PRESS 12 MALE/MALE (MISCELLANEOUS) ×1 IMPLANT
TUBING ART PRESS 48 MALE/FEM (TUBING) ×2 IMPLANT
UNDERPAD 30X36 HEAVY ABSORB (UNDERPADS AND DIAPERS) ×3 IMPLANT
VALVE AORTIC SZ25 INSP/RESIL (Prosthesis & Implant Heart) ×1 IMPLANT
VENT LEFT HEART 12002 (CATHETERS) ×6
WATER STERILE IRR 1000ML POUR (IV SOLUTION) ×6 IMPLANT
YANKAUER SUCT BULB TIP NO VENT (SUCTIONS) ×1 IMPLANT

## 2020-05-12 NOTE — Plan of Care (Signed)
  Problem: Cardiac: Goal: Will achieve and/or maintain hemodynamic stability Outcome: Progressing   Problem: Clinical Measurements: Goal: Postoperative complications will be avoided or minimized Outcome: Progressing

## 2020-05-12 NOTE — Anesthesia Procedure Notes (Signed)
Central Venous Catheter Insertion Performed by: Belinda Block, MD, anesthesiologist Start/End12/02/2020 6:55 AM, 05/12/2020 7:10 AM Patient location: Pre-op. Preanesthetic checklist: patient identified, IV checked, site marked, risks and benefits discussed, surgical consent, monitors and equipment checked, pre-op evaluation, timeout performed and anesthesia consent Lidocaine 1% used for infiltration and patient sedated Hand hygiene performed  and maximum sterile barriers used  Catheter size: 8.5 Fr Sheath introducer Procedure performed using ultrasound guided technique. Ultrasound Notes:anatomy identified, needle tip was noted to be adjacent to the nerve/plexus identified, no ultrasound evidence of intravascular and/or intraneural injection and image(s) printed for medical record Attempts: 1 Following insertion, line sutured and dressing applied. Post procedure assessment: blood return through all ports, free fluid flow and no air  Patient tolerated the procedure well with no immediate complications.

## 2020-05-12 NOTE — Procedures (Signed)
Extubation Procedure Note  Patient Details:   Name: JATNIEL VERASTEGUI DOB: 09/09/1950 MRN: [id number]   Airway Documentation:    Vent end date: 05/12/20 Vent end time: 1935   Evaluation  O2 sats: stable throughout Complications: No apparent complications Patient did tolerate procedure well. Bilateral Breath Sounds: Diminished,Rhonchi   Yes   pt's mouth and airway suctioned. VC 1.1L NIF-33. No stridor noted extubated to 4L [place]. No complications noted  Angelique Holm 05/12/2020, 7:44 PM

## 2020-05-12 NOTE — Anesthesia Procedure Notes (Signed)
Procedure Name: Intubation Date/Time: 05/12/2020 7:55 AM Performed by: Trinna Post., CRNA Pre-anesthesia Checklist: Patient identified, Emergency Drugs available, Suction available, Patient being monitored and Timeout performed Patient Re-evaluated:Patient Re-evaluated prior to induction Oxygen Delivery Method: Circle system utilized Preoxygenation: Pre-oxygenation with 100% oxygen Induction Type: IV induction and Cricoid Pressure applied Ventilation: Oral airway inserted - appropriate to patient size and Mask ventilation without difficulty Laryngoscope Size: Mac and 4 Grade View: Grade II Tube type: Oral Tube size: 8.0 mm Number of attempts: 1 Airway Equipment and Method: Stylet Placement Confirmation: ETT inserted through vocal cords under direct vision,  positive ETCO2 and breath sounds checked- equal and bilateral Secured at: 23 cm Tube secured with: Tape Dental Injury: Teeth and Oropharynx as per pre-operative assessment

## 2020-05-12 NOTE — Anesthesia Procedure Notes (Signed)
Central Venous Catheter Insertion Performed by: Belinda Block, MD, anesthesiologist Start/End12/02/2020 6:55 AM, 05/12/2020 7:10 AM Patient location: OR. Preanesthetic checklist: patient identified, IV checked, site marked, risks and benefits discussed, surgical consent, monitors and equipment checked, pre-op evaluation, timeout performed and anesthesia consent Position: Trendelenburg Lidocaine 1% used for infiltration and patient sedated Hand hygiene performed  and maximum sterile barriers used  Catheter size: 8.5 Fr PA cath was placed.Sheath introducer Swan type:thermodilution Procedure performed without using ultrasound guided technique. Ultrasound Notes:anatomy identified, needle tip was noted to be adjacent to the nerve/plexus identified, no ultrasound evidence of intravascular and/or intraneural injection and image(s) printed for medical record Attempts: 1 Following insertion, line sutured, dressing applied and Biopatch. Post procedure assessment: blood return through all ports  Patient tolerated the procedure well with no immediate complications.

## 2020-05-12 NOTE — Anesthesia Procedure Notes (Deleted)
Anesthesia Regional Block: Interscalene brachial plexus block   Pre-Anesthetic Checklist: ,, timeout performed, Correct Patient, Correct Site, Correct Laterality, Correct Procedure, Correct Position, site marked, Risks and benefits discussed,  Surgical consent,  Pre-op evaluation,  At surgeon's request and post-op pain management  Laterality: Right  Prep: chloraprep       Needles:  Injection technique: Single-shot  Needle Type: Stimulator Needle - 40          Additional Needles:   Procedures: Doppler guided, nerve stimulator,,, ultrasound used (permanent image in chart),,,,   Nerve Stimulator or Paresthesia:  Response: 0.5 mA,   Additional Responses:   Narrative:  Start time: 05/12/2020 11:10 AM End time: 05/12/2020 11:25 AM Injection made incrementally with aspirations every 5 mL.  Performed by: Personally  Anesthesiologist: Belinda Block, MD

## 2020-05-12 NOTE — Transfer of Care (Signed)
Immediate Anesthesia Transfer of Care Note  Patient: TAYSHON WINKER  Procedure(s) Performed: BENTALL PROCEDURE (N/A Chest) TRANSESOPHAGEAL ECHOCARDIOGRAM (TEE) (N/A )  Patient Location: PACU and ICU  Anesthesia Type:General  Level of Consciousness: sedated and Patient remains intubated per anesthesia plan  Airway & Oxygen Therapy: Patient remains intubated per anesthesia plan and Patient placed on Ventilator (see vital sign flow sheet for setting)  Post-op Assessment: Report given to RN and Post -op Vital signs reviewed and stable  Post vital signs: Reviewed and stable  Last Vitals:  Vitals Value Taken Time  BP 131/77 05/12/20 1350  Temp 34.7 C 05/12/20 1352  Pulse 89 05/12/20 1352  Resp 12 05/12/20 1352  SpO2 98 % 05/12/20 1352  Vitals shown include unvalidated device data.  Last Pain:  Vitals:   05/12/20 0628  PainSc: 0-No pain      Patients Stated Pain Goal: 3 (87/27/61 8485)  Complications: No complications documented.

## 2020-05-12 NOTE — Anesthesia Procedure Notes (Signed)
Arterial Line Insertion Start/End12/02/2020 7:10 AM Performed by: Trinna Post., CRNA, CRNA  Patient location: Pre-op. Preanesthetic checklist: patient identified, IV checked, site marked, risks and benefits discussed, surgical consent, monitors and equipment checked, pre-op evaluation, timeout performed and anesthesia consent Lidocaine 1% used for infiltration and patient sedated Left, radial was placed Catheter size: 20 G Hand hygiene performed  and maximum sterile barriers used   Attempts: 1 Procedure performed without using ultrasound guided technique. Following insertion, dressing applied and Biopatch. Post procedure assessment: normal  Patient tolerated the procedure well with no immediate complications.

## 2020-05-12 NOTE — Progress Notes (Signed)
  Echocardiogram Echocardiogram Transesophageal has been performed.  Devon Sanchez 05/12/2020, 8:41 AM

## 2020-05-12 NOTE — Anesthesia Procedure Notes (Signed)
Arterial Line Insertion Start/End12/02/2020 7:45 AM Performed by: Wilburn Cornelia, CRNA, CRNA  Patient location: OR. Preanesthetic checklist: patient identified, IV checked, site marked, risks and benefits discussed, surgical consent, monitors and equipment checked, pre-op evaluation, timeout performed and anesthesia consent Patient sedated Right, radial was placed Catheter size: 20 G Hand hygiene performed  and maximum sterile barriers used   Attempts: 1 Procedure performed without using ultrasound guided technique. Following insertion, dressing applied and Biopatch. Post procedure assessment: normal  Patient tolerated the procedure well with no immediate complications.

## 2020-05-12 NOTE — Progress Notes (Signed)
      [address]       [place] [address]             [phone]      S/p [place]  Starting to wake up  BP (!) 119/95   Pulse 88   Temp (!) 96.8 F (36 C)   Resp 12   Ht 6\' 2"  (1.88 m)   Wt 114.9 kg   SpO2 100%   BMI 32.53 kg/m  Ci 2.1, PA 34/19  Intake/Output Summary (Last 24 hours) at 05/12/2020 1700 Last data filed at 05/12/2020 1603 Gross per 24 hour  Intake 6153.72 ml  Output 3570 ml  Net 2583.72 ml   K= 4.4, Hct = 28  Doing well early postop  Remo Lipps C. Roxan Hockey, MD [facility] [phone]

## 2020-05-12 NOTE — Brief Op Note (Signed)
05/12/2020  1:37 PM  PATIENT:  Devon Sanchez  69 y.o. male  PRE-OPERATIVE DIAGNOSIS:  AORTIC STENOSIS ASCENDING AORTIC ANEURYSM  POST-OPERATIVE DIAGNOSIS:  AORTIC STENOSIS ASCENDING AORTIC ANEURYSM  PROCEDURE:  Procedure(s): BENTALL PROCEDURE (N/A) TRANSESOPHAGEAL ECHOCARDIOGRAM (TEE) (N/A)  SURGEON:  Surgeon(s) and Role:    * Wonda Olds, MD - Primary    * Ivin Poot, MD - Assisting  PHYSICIAN ASSISTANT: n/a  ASSISTANTS: staff   ANESTHESIA:   general  EBL:  1750 mL   BLOOD ADMINISTERED:per anes record  DRAINS: 2 Chest Tube(s) in the mediastinum   LOCAL MEDICATIONS USED:  NONE  SPECIMEN:  Source of Specimen:  aortic valve and ascending aorta  DISPOSITION OF SPECIMEN:  PATHOLOGY  COUNTS:  YES  TOURNIQUET:  * No tourniquets in log *  DICTATION: .Note written in EPIC  PLAN OF CARE: Admit to inpatient   PATIENT DISPOSITION:  ICU - intubated and critically ill.   Delay start of Pharmacological VTE agent (>24hrs) due to surgical blood loss or risk of bleeding: yes

## 2020-05-12 NOTE — H&P (Signed)
History and Physical Interval Note:  05/12/2020 7:26 AM  Devon Sanchez  has presented today for surgery, with the diagnosis of AS ASCENDING AORTIC ANEURYSM.  The various methods of treatment have been discussed with the patient and family. After consideration of risks, benefits and other options for treatment, the patient has consented to  Procedure(s): BENTALL PROCEDURE (N/A) TRANSESOPHAGEAL ECHOCARDIOGRAM (TEE) (N/A) as a surgical intervention.  The patient's history has been reviewed, patient examined, no change in status, stable for surgery.  I have reviewed the patient's chart and labs.  Questions were answered to the patient's satisfaction.     Wonda Olds

## 2020-05-12 NOTE — Anesthesia Postprocedure Evaluation (Signed)
Anesthesia Post Note  Patient: Devon Sanchez  Procedure(s) Performed: BENTALL PROCEDURE (N/A Chest) TRANSESOPHAGEAL ECHOCARDIOGRAM (TEE) (N/A )     Patient location during evaluation: SICU Anesthesia Type: General Level of consciousness: patient remains intubated per anesthesia plan Pain management: pain level controlled Vital Signs Assessment: post-procedure vital signs reviewed and stable Respiratory status: patient remains intubated per anesthesia plan Cardiovascular status: stable Postop Assessment: no apparent nausea or vomiting Anesthetic complications: no   No complications documented.  Last Vitals:  Vitals:   05/12/20 1815 05/12/20 1820  BP:  112/63  Pulse: 89 90  Resp: 12 12  Temp: 37.2 C   SpO2: 100% 100%    Last Pain:  Vitals:   05/12/20 1400  TempSrc: Core  PainSc:                  Huntington Leverich

## 2020-05-13 ENCOUNTER — Inpatient Hospital Stay (HOSPITAL_COMMUNITY): Payer: Medicare Other

## 2020-05-13 ENCOUNTER — Encounter (HOSPITAL_COMMUNITY): Payer: Self-pay | Admitting: Cardiothoracic Surgery

## 2020-05-13 LAB — CBC
HCT: 25.3 % — ABNORMAL LOW (ref 39.0–52.0)
HCT: 24 % — ABNORMAL LOW (ref 39.0–52.0)
Hemoglobin: 8.8 g/dL — ABNORMAL LOW (ref 13.0–17.0)
Hemoglobin: 8.5 g/dL — ABNORMAL LOW (ref 13.0–17.0)
MCH: 32.8 pg (ref 26.0–34.0)
MCH: 34 pg (ref 26.0–34.0)
MCHC: 34.8 g/dL (ref 30.0–36.0)
MCHC: 35.4 g/dL (ref 30.0–36.0)
MCV: 94.4 fL (ref 80.0–100.0)
MCV: 96 fL (ref 80.0–100.0)
Platelets: 127 10*3/uL — ABNORMAL LOW (ref 150–400)
Platelets: 124 10*3/uL — ABNORMAL LOW (ref 150–400)
RBC: 2.68 MIL/uL — ABNORMAL LOW (ref 4.22–5.81)
RBC: 2.5 MIL/uL — ABNORMAL LOW (ref 4.22–5.81)
RDW: 14.1 % (ref 11.5–15.5)
RDW: 14.5 % (ref 11.5–15.5)
WBC: 9.1 10*3/uL (ref 4.0–10.5)
WBC: 11.2 10*3/uL — ABNORMAL HIGH (ref 4.0–10.5)
nRBC: 0 % (ref 0.0–0.2)
nRBC: 0 % (ref 0.0–0.2)

## 2020-05-13 LAB — BPAM PLATELET PHERESIS
Blood Product Expiration Date: 202112102359
Blood Product Expiration Date: 202112102359
ISSUE DATE / TIME: 202112090933
ISSUE DATE / TIME: 202112090933
Unit Type and Rh: 6200
Unit Type and Rh: 7300

## 2020-05-13 LAB — GLUCOSE, CAPILLARY
Glucose-Capillary: 106 mg/dL — ABNORMAL HIGH (ref 70–99)
Glucose-Capillary: 117 mg/dL — ABNORMAL HIGH (ref 70–99)
Glucose-Capillary: 123 mg/dL — ABNORMAL HIGH (ref 70–99)
Glucose-Capillary: 124 mg/dL — ABNORMAL HIGH (ref 70–99)
Glucose-Capillary: 126 mg/dL — ABNORMAL HIGH (ref 70–99)
Glucose-Capillary: 127 mg/dL — ABNORMAL HIGH (ref 70–99)
Glucose-Capillary: 129 mg/dL — ABNORMAL HIGH (ref 70–99)
Glucose-Capillary: 143 mg/dL — ABNORMAL HIGH (ref 70–99)

## 2020-05-13 LAB — PREPARE PLATELET PHERESIS
Unit division: 0
Unit division: 0

## 2020-05-13 LAB — COMPREHENSIVE METABOLIC PANEL
ALT: 25 U/L (ref 0–44)
AST: 46 U/L — ABNORMAL HIGH (ref 15–41)
Albumin: 3.1 g/dL — ABNORMAL LOW (ref 3.5–5.0)
Alkaline Phosphatase: 45 U/L (ref 38–126)
Anion gap: 9 (ref 5–15)
BUN: 28 mg/dL — ABNORMAL HIGH (ref 8–23)
CO2: 22 mmol/L (ref 22–32)
Calcium: 7.7 mg/dL — ABNORMAL LOW (ref 8.9–10.3)
Chloride: 101 mmol/L (ref 98–111)
Creatinine, Ser: 1.46 mg/dL — ABNORMAL HIGH (ref 0.61–1.24)
GFR, Estimated: 52 mL/min — ABNORMAL LOW (ref >60)
Glucose, Bld: 136 mg/dL — ABNORMAL HIGH (ref 70–99)
Potassium: 4 mmol/L (ref 3.5–5.1)
Sodium: 132 mmol/L — ABNORMAL LOW (ref 135–145)
Total Bilirubin: 1 mg/dL (ref 0.3–1.2)
Total Protein: 5.3 g/dL — ABNORMAL LOW (ref 6.5–8.1)

## 2020-05-13 LAB — ECHO INTRAOPERATIVE TEE
Height: 74 in
Weight: 4054.38 oz

## 2020-05-13 LAB — BPAM CRYOPRECIPITATE
Blood Product Expiration Date: 202112091520
ISSUE DATE / TIME: 202112090946
Unit Type and Rh: 6200

## 2020-05-13 LAB — BASIC METABOLIC PANEL
Anion gap: 8 (ref 5–15)
BUN: 23 mg/dL (ref 8–23)
CO2: 23 mmol/L (ref 22–32)
Calcium: 7.6 mg/dL — ABNORMAL LOW (ref 8.9–10.3)
Chloride: 107 mmol/L (ref 98–111)
Creatinine, Ser: 1.2 mg/dL (ref 0.61–1.24)
GFR, Estimated: >60 mL/min (ref >60)
Glucose, Bld: 124 mg/dL — ABNORMAL HIGH (ref 70–99)
Potassium: 4.6 mmol/L (ref 3.5–5.1)
Sodium: 138 mmol/L (ref 135–145)

## 2020-05-13 LAB — BPAM FFP
Blood Product Expiration Date: 202112132359
Blood Product Expiration Date: 202112132359
ISSUE DATE / TIME: 202112090933
ISSUE DATE / TIME: 202112090933
Unit Type and Rh: 600
Unit Type and Rh: 600

## 2020-05-13 LAB — PREPARE FRESH FROZEN PLASMA
Unit division: 0
Unit division: 0

## 2020-05-13 LAB — MAGNESIUM
Magnesium: 2.6 mg/dL — ABNORMAL HIGH (ref 1.7–2.4)
Magnesium: 2.5 mg/dL — ABNORMAL HIGH (ref 1.7–2.4)

## 2020-05-13 LAB — PREPARE CRYOPRECIPITATE: Unit division: 0

## 2020-05-13 LAB — SURGICAL PATHOLOGY

## 2020-05-13 MED ORDER — FUROSEMIDE 10 MG/ML IJ SOLN
20.0000 mg | Freq: Once | INTRAMUSCULAR | Status: AC
  Administered 2020-05-13: 20 mg via INTRAVENOUS
  Filled 2020-05-13: qty 2

## 2020-05-13 MED ORDER — DIAZEPAM 2 MG PO TABS
2.0000 mg | ORAL_TABLET | Freq: Three times a day (TID) | ORAL | Status: AC
  Administered 2020-05-13 – 2020-05-14 (×6): 2 mg via ORAL
  Filled 2020-05-13 (×6): qty 1

## 2020-05-13 MED ORDER — INSULIN ASPART 100 UNIT/ML ~~LOC~~ SOLN
0.0000 [IU] | SUBCUTANEOUS | Status: DC
  Administered 2020-05-13 – 2020-05-14 (×5): 2 [IU] via SUBCUTANEOUS

## 2020-05-13 MED ORDER — FE FUMARATE-B12-VIT C-FA-IFC PO CAPS
1.0000 | ORAL_CAPSULE | Freq: Two times a day (BID) | ORAL | Status: DC
  Administered 2020-05-13 – 2020-05-19 (×13): 1 via ORAL
  Filled 2020-05-13 (×14): qty 1

## 2020-05-13 MED ORDER — COLCHICINE 0.3 MG HALF TABLET
0.3000 mg | ORAL_TABLET | Freq: Two times a day (BID) | ORAL | Status: DC
  Administered 2020-05-13 – 2020-05-19 (×13): 0.3 mg via ORAL
  Filled 2020-05-13 (×15): qty 1

## 2020-05-13 MED ORDER — KETOROLAC TROMETHAMINE 15 MG/ML IJ SOLN
7.5000 mg | Freq: Four times a day (QID) | INTRAMUSCULAR | Status: AC
  Administered 2020-05-13 – 2020-05-14 (×5): 7.5 mg via INTRAVENOUS
  Filled 2020-05-13 (×5): qty 1

## 2020-05-13 MED ORDER — THIAMINE HCL 100 MG/ML IJ SOLN
Freq: Once | INTRAVENOUS | Status: AC
  Filled 2020-05-13: qty 1000

## 2020-05-13 NOTE — Progress Notes (Signed)
1 Day Post-Op Procedure(s) (LRB): BENTALL PROCEDURE (N/A) TRANSESOPHAGEAL ECHOCARDIOGRAM (TEE) (N/A) Subjective: No complaints  Objective: Vital signs in last 24 hours: Temp:  [94.46 F (34.7 C)-100.58 F (38.1 C)] 100.58 F (38.1 C) (12/10 0700) Pulse Rate:  [82-112] 98 (12/10 0700) Cardiac Rhythm: Normal sinus rhythm (12/10 0530) Resp:  [10-28] 24 (12/10 0700) BP: (82-131)/(57-95) 95/63 (12/10 0700) SpO2:  [90 %-100 %] 93 % (12/10 0700) Arterial Line BP: (70-134)/(41-82) 105/54 (12/10 0700) FiO2 (%):  [40 %-50 %] 40 % (12/09 1840) Weight:  [173.5 kg] 119.2 kg (12/10 0500)  Hemodynamic parameters for last 24 hours: PAP: (25-54)/(12-38) 54/38 CO:  [3.5 L/min-8.9 L/min] 5.8 L/min CI:  [1.5 L/min/m2-3.7 L/min/m2] 2.4 L/min/m2  Intake/Output from previous day: 12/09 0701 - 12/10 0700 In: 9062.2 [P.O.:600; I.V.:3869.5; Blood:2349; IV Piggyback:2243.7] Out: 42 [Urine:2480; Blood:1750; Chest Tube:650] Intake/Output this shift: No intake/output data recorded.  General appearance: alert and cooperative Neurologic: intact Heart: regular rate and rhythm, S1, S2 normal, no murmur, click, rub or gallop Lungs: clear to auscultation bilaterally Abdomen: soft, non-tender; bowel sounds normal; no masses,  no organomegaly Extremities: extremities normal, atraumatic, no cyanosis or edema Wound: dressed, dry  Lab Results: Recent Labs    05/12/20 1927 05/12/20 2037 05/13/20 0338  WBC 6.8  --  9.1  HGB 9.6* 8.8* 8.8*  HCT 27.1* 26.0* 25.3*  PLT 134*  --  127*   BMET:  Recent Labs    05/12/20 1927 05/12/20 2037 05/13/20 0338  NA 136 141 138  K 4.7 4.8 4.6  CL 107  --  107  CO2 23  --  23  GLUCOSE 121*  --  124*  BUN 22  --  23  CREATININE 1.04  --  1.20  CALCIUM 7.7*  --  7.6*    PT/INR:  Recent Labs    05/12/20 1354  LABPROT 16.6*  INR 1.4*   ABG    Component Value Date/Time   PHART 7.365 05/12/2020 2037   HCO3 23.1 05/12/2020 2037   TCO2 24 05/12/2020 2037    ACIDBASEDEF 2.0 05/12/2020 2037   O2SAT 92.0 05/12/2020 2037   CBG (last 3)  Recent Labs    05/13/20 0334 05/13/20 0546 05/13/20 0756  GLUCAP 117* 126* 143*    Assessment/Plan: S/P Procedure(s) (LRB): BENTALL PROCEDURE (N/A) TRANSESOPHAGEAL ECHOCARDIOGRAM (TEE) (N/A) Mobilize Diabetes control See progression orders   LOS: 1 day    Wonda Olds 05/13/2020

## 2020-05-13 NOTE — Evaluation (Signed)
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 Experiencing SoB and mild chest pain with exertion. 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  Clinical Impression  Pt up in chair for 2 hours this morning and is ready to return to bed due to pressure on his bottom. PTA pt living with wife in multistory home with bed and bath on main level and 4 steps to enter. Pt completely independent. Pt is currently limited in safe mobility by sternal incision pain as well related decreased endurance and balance. Pt is modAx2 for standing from chair, min Ax2 for ambulation with EVA walker and modAx2 for return to bed. Pt has good support from his wife and PT is recommending HHPT at discharge to improve mobility in his home environment. PT will continue to follow acutely.    Follow Up Recommendations Home health PT;Supervision/Assistance - 24 hour    Equipment Recommendations  Standard walker;3in1 (PT)    Recommendations for Other Services       Precautions / Restrictions Precautions Precautions: Sternal Precaution Comments: familiar with precautions but not able to recall at beginning of sessio Restrictions Weight Bearing Restrictions: Yes RUE Weight Bearing: Touch down weight bearing LUE Weight Bearing: Touch down weight bearing Other Position/Activity Restrictions: sternal precautions      Mobility  Bed Mobility Overal bed mobility: Needs Assistance Bed Mobility: Sit to Sidelying;Rolling Rolling: Min assist       Sit to sidelying: Mod assist;+2 for safety/equipment General bed mobility comments: modA for managing LE back into bed and to maintain sidelying before requiring minA for rolling onto his back, vc for maintaining  sidelying before rolling on back to reduce thoracic twisting    Transfers Overall transfer level: Needs assistance Equipment used:  (EVA walker) Transfers: Sit to/from Stand Sit to Stand: Mod assist;+2 physical assistance         General transfer comment: modAx2 for power up from low recliner surface to RW  Ambulation/Gait Ambulation/Gait assistance: Min assist;+2 safety/equipment Gait Distance (Feet): 12 Feet Assistive device:  (EVA walker) Gait Pattern/deviations: Step-through pattern;Decreased step length - right;Decreased step length - left;Shuffle Gait velocity: slowed Gait velocity interpretation: <1.31 ft/sec, indicative of household ambulator General Gait Details: min A for steadying with slow, shuffling gait with increased pain with movement, vc for proximity to RW and upright posture        Balance Overall balance assessment: Needs assistance Sitting-balance support: No upper extremity supported Sitting balance-Leahy Scale: Fair     Standing balance support: Bilateral upper extremity supported Standing balance-Leahy Scale: Poor                               Pertinent Vitals/Pain Pain Assessment: Faces Faces Pain Scale: Hurts whole lot Pain Location: sternum Pain Descriptors / Indicators: Operative site guarding;Grimacing;Sharp;Stabbing Pain Intervention(s): Monitored during session    Home Living Family/patient expects to be discharged to:: Private residence Living Arrangements: Spouse/significant other Available Help at Discharge: Family;Available 24 hours/day Type of Home: House Home Access: Stairs to enter Entrance Stairs-Rails: Can reach both Entrance Stairs-Number of Steps: 4 Home Layout: Multi-level;Laundry or work area in basement;Able to live on main level with bedroom/bathroom Home Equipment: None Additional Comments: has old RW and W/c from grandfather    Prior Function  Level of Independence: Independent         Comments:  recent disc issue, was using RW PRN, but was back to walking without an AD.  Ind with ADL/IADL     Hand Dominance   Dominant Hand: Right    Extremity/Trunk Assessment   Upper Extremity Assessment Upper Extremity Assessment: LUE deficits/detail LUE Deficits / Details: prior shoulder injury. LUE Sensation: WNL LUE Coordination: WNL    Lower Extremity Assessment Lower Extremity Assessment: Defer to PT evaluation    Cervical / Trunk Assessment Cervical / Trunk Assessment: Normal  Communication   Communication: No difficulties  Cognition Arousal/Alertness: Awake/alert Behavior During Therapy: WFL for tasks assessed/performed Overall Cognitive Status: Within Functional Limits for tasks assessed                                        General Comments General comments (skin integrity, edema, etc.): Pt on 3.5 L O2 via [place], VSS        Assessment/Plan    PT Assessment Patient needs continued PT services  PT Problem List Decreased activity tolerance;Decreased balance;Decreased mobility;Cardiopulmonary status limiting activity;Pain       PT Treatment Interventions DME instruction;Gait training;Stair training;Functional mobility training;Therapeutic activities;Therapeutic exercise;Balance training;Cognitive remediation;Patient/family education    PT Goals (Current goals can be found in the Care Plan section)  Acute Rehab PT Goals Patient Stated Goal: I hope to go home and feel better PT Goal Formulation: With patient/family Time For Goal Achievement: 05/27/20 Potential to Achieve Goals: Good    Frequency Min 3X/week        Co-evaluation PT/OT/SLP Co-Evaluation/Treatment: Yes Reason for Co-Treatment: Complexity of the patient's impairments (multi-system involvement);For patient/therapist safety PT goals addressed during session: Mobility/safety with mobility OT goals addressed during session: ADL's and self-care       AM-PAC PT "6 Clicks" Mobility   Outcome Measure Help needed turning from your back to your side while in a flat bed without using bedrails?: A Lot Help needed moving from lying on your back to sitting on the side of a flat bed without using bedrails?: A Lot Help needed moving to and from a bed to a chair (including a wheelchair)?: A Lot Help needed standing up from a chair using your arms (e.g., wheelchair or bedside chair)?: A Lot Help needed to walk in hospital room?: A Little Help needed climbing 3-5 steps with a railing? : A Lot 6 Click Score: 13    End of Session Equipment Utilized During Treatment: Oxygen Activity Tolerance: Patient limited by pain Patient left: in bed;with call bell/phone within reach;with nursing/sitter in room Nurse Communication: Mobility status PT Visit Diagnosis: Unsteadiness on feet (R26.81);Other abnormalities of gait and mobility (R26.89);Muscle weakness (generalized) (M62.81);Pain Pain - part of body:  (sternum)    Time: 9242-6834 PT Time Calculation (min) (ACUTE ONLY): 23 min   Charges:   PT Evaluation $PT Eval Moderate Complexity: 1 Mod          Eithel Ryall B. Migdalia Dk PT, DPT Acute Rehabilitation Services Pager [phone] Office [phone]   [place] 05/13/2020, 11:46 AM

## 2020-05-13 NOTE — Evaluation (Signed)
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 Experiencing SoB and mild chest pain with exertion. 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   Clinical Impression   Patient admitted for the above diagnosis and procedure.  Deficits are listed below.  PTA is was largely Independent with mobility, ADL, IADL, drove and managed medications.  Spouse will be at the home to assist as needed.  Currently he is needing Mod A or two for sit to stand, Min A of 2 for basic mobility, and up to Max A for lower body ADL.  He is hoping to progress enough to return home with [facility] services.  OT will follow in the acute setting.      Follow Up Recommendations  Home health OT;Supervision/Assistance - 24 hour    Equipment Recommendations  3 in 1 bedside commode;Tub/shower seat    Recommendations for Other Services       Precautions / Restrictions Precautions Precautions: Sternal Precaution Comments: familiar with precautions but not able to recall at beginning of sessio Restrictions Weight Bearing Restrictions: Yes RUE Weight Bearing: Touch down weight bearing LUE Weight Bearing: Touch down weight bearing Other Position/Activity Restrictions: sternal precautions      Mobility Bed Mobility Overal bed mobility: Needs Assistance Bed Mobility: Sit to Sidelying;Rolling Rolling: Min assist       Sit to sidelying: Mod assist;+2 for safety/equipment General bed mobility comments: modA for managing LE back into bed and to maintain sidelying before requiring minA for rolling onto his back, vc for maintaining sidelying before rolling on back to reduce thoracic twisting    Transfers Overall transfer level: Needs assistance Equipment used:   (EVA walker) Transfers: Sit to/from Stand Sit to Stand: Mod assist;+2 physical assistance         General transfer comment: modAx2 for power up from low recliner surface to RW    Balance Overall balance assessment: Needs assistance Sitting-balance support: No upper extremity supported Sitting balance-Leahy Scale: Fair     Standing balance support: Bilateral upper extremity supported Standing balance-Leahy Scale: Poor                             ADL either performed or assessed with clinical judgement   ADL Overall ADL's : Needs assistance/impaired     Grooming: Wash/dry hands;Wash/dry face;Set up;Sitting   Upper Body Bathing: Moderate assistance;Sitting   Lower Body Bathing: Maximal assistance;Sitting/lateral leans   Upper Body Dressing : Moderate assistance;Sitting   Lower Body Dressing: Maximal assistance;Sitting/lateral leans   Toilet Transfer: Minimal assistance;+2 for safety/equipment   Toileting- Clothing Manipulation and Hygiene: Maximal assistance;Sit to/from stand       Functional mobility during ADLs: Minimal assistance;+2 for safety/equipment       Vision Patient Visual Report: No change from baseline       Perception     Praxis      Pertinent Vitals/Pain Pain Assessment: Faces Faces Pain Scale: Hurts whole lot Pain Location: sternum Pain Descriptors / Indicators: Operative site guarding;Grimacing;Sharp;Stabbing Pain Intervention(s): Monitored during session     Hand Dominance Right   Extremity/Trunk Assessment Upper Extremity Assessment Upper Extremity Assessment: LUE deficits/detail LUE Deficits / Details: prior shoulder injury. LUE Sensation: WNL LUE Coordination: WNL   Lower Extremity Assessment Lower Extremity Assessment: Defer  to PT evaluation   Cervical / Trunk Assessment Cervical / Trunk Assessment: Normal   Communication Communication Communication: No difficulties   Cognition Arousal/Alertness:  Awake/alert Behavior During Therapy: WFL for tasks assessed/performed Overall Cognitive Status: Within Functional Limits for tasks assessed                                     General Comments   HR up to 101 with mobility    Exercises     Shoulder Instructions      Home Living Family/patient expects to be discharged to:: Private residence Living Arrangements: Spouse/significant other Available Help at Discharge: Family;Available 24 hours/day Type of Home: House Home Access: Stairs to enter [employer] of Steps: 4 Entrance Stairs-Rails: Can reach both Home Layout: Multi-level;Laundry or work area in basement;Able to live on main level with bedroom/bathroom     Bathroom Shower/Tub: Occupational psychologist: Programmer, systems: Yes How Accessible: Accessible via walker Home Equipment: None   Additional Comments: has old RW and W/c from grandfather      Prior Functioning/Environment Level of Independence: Independent        Comments: recent disc issue, was using RW PRN, but was back to walking without an AD.  Ind with ADL/IADL        OT Problem List: Decreased strength;Decreased range of motion;Decreased activity tolerance;Impaired balance (sitting and/or standing);Decreased knowledge of use of DME or AE;Decreased knowledge of precautions;Pain      OT Treatment/Interventions: Self-care/ADL training;Therapeutic exercise;Energy conservation;DME and/or AE instruction;Balance training;Therapeutic activities    OT Goals(Current goals can be found in the care plan section) Acute Rehab OT Goals Patient Stated Goal: I hope to go home and feel better OT Goal Formulation: With patient Time For Goal Achievement: 05/27/20 Potential to Achieve Goals: Good ADL Goals Pt Will Perform Grooming: with min guard assist;standing Pt Will Perform Upper Body Bathing: with supervision;sitting Pt Will Perform Upper Body Dressing: with  supervision;sitting Pt Will Transfer to Toilet: with min assist;ambulating;regular height toilet  OT Frequency: Min 2X/week   Barriers to D/C:    none noted       Co-evaluation PT/OT/SLP Co-Evaluation/Treatment: Yes Reason for Co-Treatment: Complexity of the patient's impairments (multi-system involvement);For patient/therapist safety PT goals addressed during session: Mobility/safety with mobility OT goals addressed during session: ADL's and self-care      AM-PAC OT "6 Clicks" Daily Activity     Outcome Measure Help from another person eating meals?: None Help from another person taking care of personal grooming?: A Little Help from another person toileting, which includes using toliet, bedpan, or urinal?: A Lot Help from another person bathing (including washing, rinsing, drying)?: A Lot Help from another person to put on and taking off regular upper body clothing?: A Lot Help from another person to put on and taking off regular lower body clothing?: A Lot 6 Click Score: 15   End of Session Equipment Utilized During Treatment: Rolling walker;Oxygen Nurse Communication: Mobility status  Activity Tolerance: Patient limited by pain;Patient limited by fatigue Patient left: in bed;with call bell/phone within reach;with nursing/sitter in room;with family/visitor present  OT Visit Diagnosis: Unsteadiness on feet (R26.81);Muscle weakness (generalized) (M62.81);Pain                Time: 1443-1540 OT Time Calculation (min): 25 min Charges:  OT General Charges $OT Visit: 1 Visit OT Evaluation $OT Eval Moderate Complexity: 1 Mod  05/13/2020  Denice Paradise, OTR/L  Acute Rehabilitation Services  Office:  [place] 05/13/2020, 11:23 AM

## 2020-05-13 NOTE — Hospital Course (Addendum)
         History of Present Illness: 69 year old gentleman presents for consultation regarding moderate aortic valve disease and ascending aortic aneurysm.  He has been followed by Dr. Prescott Gum, who has kindly asked me to see this patient.  The patient has also been followed by Dr. 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  He has a 5.2 cm ascending aortic aneurysm.  The patient endorses shortness of breath and mild chest pain with exertion.  He also reports occasional skipped beats or palpitation but there has been no documented atrial fibrillation   Postoperative Course in Hospital: Devon Sanchez was admitted for elective surgery on 05/13/20 and taken to the OR where the aortic valve and ascending aorta were replaced Devon Sanchez Procedure). Following the procedure he separated from cardiopulmonary bypass without difficulty and was transferred to the ICU hemodynamically stable. He was weaned from mechanical ventilator support and was extubated by 8pm on the day of surgery. He was mobilized on the first post-op day with physical therapy.  He has maintained stable hemodynamics but did have postoperative atrial fibrillation.  He has subsequently been chemically cardioverted to sinus rhythm with oral amiodarone.  He has had some volume overload but is responding well to diuretics which are decreasing over time.  He has required transfusion for expected acute blood loss anemia but hemoglobin hematocrit have stabilized.  He is also on iron supplementation.  Blood sugars have been under adequate control using standard protocols.  He did have a postoperative thrombocytopenia but this has improved over time and platelets are now in the normal range.  He has required aggressive pulmonary toilet and oxygen is slowly being weaned.  On 05/18/2020 he was only on 1 L by nasal cannula.  He is doing well in his physical recovery and physical therapy has recommended home  health PT at the time of discharge and this has been arranged.

## 2020-05-13 NOTE — Plan of Care (Signed)
  Problem: Cardiac: Goal: Will achieve and/or maintain hemodynamic stability Outcome: Progressing   Problem: Activity: Goal: Risk for activity intolerance will decrease Outcome: Not Progressing Note: Pt not tolerating mobility due to pain and difficulty breathing.   Problem: Respiratory: Goal: Respiratory status will improve Outcome: Not Progressing Note: Requiring HFNC.   Problem: Pain Managment: Goal: General experience of comfort will improve Outcome: Not Progressing

## 2020-05-14 ENCOUNTER — Inpatient Hospital Stay (HOSPITAL_COMMUNITY): Payer: Medicare Other

## 2020-05-14 LAB — GLUCOSE, CAPILLARY
Glucose-Capillary: 115 mg/dL — ABNORMAL HIGH (ref 70–99)
Glucose-Capillary: 118 mg/dL — ABNORMAL HIGH (ref 70–99)
Glucose-Capillary: 119 mg/dL — ABNORMAL HIGH (ref 70–99)
Glucose-Capillary: 121 mg/dL — ABNORMAL HIGH (ref 70–99)
Glucose-Capillary: 140 mg/dL — ABNORMAL HIGH (ref 70–99)

## 2020-05-14 LAB — CBC
HCT: 23.8 % — ABNORMAL LOW (ref 39.0–52.0)
Hemoglobin: 8 g/dL — ABNORMAL LOW (ref 13.0–17.0)
MCH: 32.5 pg (ref 26.0–34.0)
MCHC: 33.6 g/dL (ref 30.0–36.0)
MCV: 96.7 fL (ref 80.0–100.0)
Platelets: 124 10*3/uL — ABNORMAL LOW (ref 150–400)
RBC: 2.46 MIL/uL — ABNORMAL LOW (ref 4.22–5.81)
RDW: 14.3 % (ref 11.5–15.5)
WBC: 11.2 10*3/uL — ABNORMAL HIGH (ref 4.0–10.5)
nRBC: 0 % (ref 0.0–0.2)

## 2020-05-14 LAB — BASIC METABOLIC PANEL
Anion gap: 7 (ref 5–15)
BUN: 24 mg/dL — ABNORMAL HIGH (ref 8–23)
CO2: 24 mmol/L (ref 22–32)
Calcium: 8.1 mg/dL — ABNORMAL LOW (ref 8.9–10.3)
Chloride: 101 mmol/L (ref 98–111)
Creatinine, Ser: 1.26 mg/dL — ABNORMAL HIGH (ref 0.61–1.24)
GFR, Estimated: >60 mL/min (ref >60)
Glucose, Bld: 129 mg/dL — ABNORMAL HIGH (ref 70–99)
Potassium: 4 mmol/L (ref 3.5–5.1)
Sodium: 132 mmol/L — ABNORMAL LOW (ref 135–145)

## 2020-05-14 MED ORDER — LEVALBUTEROL HCL 0.63 MG/3ML IN NEBU
0.6300 mg | INHALATION_SOLUTION | Freq: Three times a day (TID) | RESPIRATORY_TRACT | Status: DC
  Administered 2020-05-14 – 2020-05-15 (×3): 0.63 mg via RESPIRATORY_TRACT
  Filled 2020-05-14 (×3): qty 3

## 2020-05-14 MED ORDER — METOPROLOL TARTRATE 25 MG PO TABS
25.0000 mg | ORAL_TABLET | Freq: Two times a day (BID) | ORAL | Status: DC
  Administered 2020-05-14 – 2020-05-19 (×11): 25 mg via ORAL
  Filled 2020-05-14 (×11): qty 1

## 2020-05-14 MED ORDER — METOPROLOL TARTRATE 25 MG/10 ML ORAL SUSPENSION
25.0000 mg | Freq: Two times a day (BID) | ORAL | Status: DC
  Filled 2020-05-14 (×9): qty 10

## 2020-05-14 MED ORDER — FUROSEMIDE 10 MG/ML IJ SOLN
40.0000 mg | Freq: Two times a day (BID) | INTRAMUSCULAR | Status: DC
  Administered 2020-05-14 – 2020-05-15 (×4): 40 mg via INTRAVENOUS
  Filled 2020-05-14 (×4): qty 4

## 2020-05-14 MED ORDER — LEVALBUTEROL HCL 0.63 MG/3ML IN NEBU
0.6300 mg | INHALATION_SOLUTION | Freq: Three times a day (TID) | RESPIRATORY_TRACT | Status: DC
  Administered 2020-05-14: 0.63 mg via RESPIRATORY_TRACT
  Filled 2020-05-14 (×2): qty 3

## 2020-05-14 NOTE — Progress Notes (Signed)
2 Days Post-Op Procedure(s) (LRB): BENTALL PROCEDURE (N/A) TRANSESOPHAGEAL ECHOCARDIOGRAM (TEE) (N/A) Subjective: No complaints  Objective: Vital signs in last 24 hours: Temp:  [98.1 F (36.7 C)-98.5 F (36.9 C)] 98.1 F (36.7 C) (12/11 0747) Pulse Rate:  [86-107] 102 (12/11 0700) Cardiac Rhythm: Normal sinus rhythm (12/11 0400) Resp:  [19-35] 26 (12/11 0700) BP: (89-135)/(50-82) 104/69 (12/11 0700) SpO2:  [90 %-98 %] 98 % (12/11 0700) Weight:  [120.2 kg] 120.2 kg (12/11 0500)  Hemodynamic parameters for last 24 hours:    Intake/Output from previous day: 12/10 0701 - 12/11 0700 In: 2954.2 [P.O.:1200; I.V.:1554.1; IV Piggyback:200.1] Out: 3474 [QVZDG:3875; Chest Tube:360] Intake/Output this shift: No intake/output data recorded.  General appearance: alert and cooperative Neurologic: intact Heart: regular rate and rhythm, S1, S2 normal, no murmur, click, rub or gallop Lungs: clear to auscultation bilaterally Abdomen: soft, non-tender; bowel sounds normal; no masses,  no organomegaly Extremities: edema 2+ Wound: cd/i  Lab Results: Recent Labs    05/13/20 1613 05/14/20 0345  WBC 11.2* 11.2*  HGB 8.5* 8.0*  HCT 24.0* 23.8*  PLT 124* 124*   BMET:  Recent Labs    05/13/20 1402 05/14/20 0345  NA 132* 132*  K 4.0 4.0  CL 101 101  CO2 22 24  GLUCOSE 136* 129*  BUN 28* 24*  CREATININE 1.46* 1.26*  CALCIUM 7.7* 8.1*    PT/INR:  Recent Labs    05/12/20 1354  LABPROT 16.6*  INR 1.4*   ABG    Component Value Date/Time   PHART 7.365 05/12/2020 2037   HCO3 23.1 05/12/2020 2037   TCO2 24 05/12/2020 2037   ACIDBASEDEF 2.0 05/12/2020 2037   O2SAT 92.0 05/12/2020 2037   CBG (last 3)  Recent Labs    05/14/20 0010 05/14/20 0409 05/14/20 0645  GLUCAP 119* 118* 121*    Assessment/Plan: S/P Procedure(s) (LRB): BENTALL PROCEDURE (N/A) TRANSESOPHAGEAL ECHOCARDIOGRAM (TEE) (N/A) Mobilize Diuresis d/c tubes/lines   LOS: 2 days    Devon Sanchez 05/14/2020

## 2020-05-14 NOTE — Plan of Care (Signed)
Problem: Education: Goal: Will demonstrate proper wound care and an understanding of methods to prevent future damage Outcome: Progressing Goal: Knowledge of disease or condition will improve Outcome: Progressing Goal: Knowledge of the prescribed therapeutic regimen will improve Outcome: Progressing   Problem: Activity: Goal: Risk for activity intolerance will decrease Outcome: Progressing   Problem: Cardiac: Goal: Will achieve and/or maintain hemodynamic stability Outcome: Progressing   Problem: Clinical Measurements: Goal: Postoperative complications will be avoided or minimized Outcome: Progressing   Problem: Respiratory: Goal: Respiratory status will improve Outcome: Progressing   Problem: Skin Integrity: Goal: Wound healing without signs and symptoms of infection Outcome: Progressing Goal: Risk for impaired skin integrity will decrease Outcome: Progressing   Problem: Urinary Elimination: Goal: Ability to achieve and maintain adequate renal perfusion and functioning will improve Outcome: Progressing   Problem: Clinical Measurements: Goal: Ability to maintain clinical measurements within normal limits will improve Outcome: Progressing Goal: Will remain free from infection Outcome: Progressing Goal: Diagnostic test results will improve Outcome: Progressing Goal: Respiratory complications will improve Outcome: Progressing Goal: Cardiovascular complication will be avoided Outcome: Progressing   Problem: Coping: Goal: Level of anxiety will decrease Outcome: Progressing   Problem: Elimination: Goal: Will not experience complications related to bowel motility Outcome: Progressing Goal: Will not experience complications related to urinary retention Outcome: Progressing   Problem: Pain Managment: Goal: General experience of comfort will improve Outcome: Progressing   Problem: Safety: Goal: Ability to remain free from injury will improve Outcome: Progressing    Problem: Skin Integrity: Goal: Risk for impaired skin integrity will decrease Outcome: Progressing

## 2020-05-15 ENCOUNTER — Inpatient Hospital Stay (HOSPITAL_COMMUNITY): Payer: Medicare Other

## 2020-05-15 LAB — BASIC METABOLIC PANEL
Anion gap: 10 (ref 5–15)
BUN: 23 mg/dL (ref 8–23)
CO2: 26 mmol/L (ref 22–32)
Calcium: 8.1 mg/dL — ABNORMAL LOW (ref 8.9–10.3)
Chloride: 98 mmol/L (ref 98–111)
Creatinine, Ser: 1.16 mg/dL (ref 0.61–1.24)
GFR, Estimated: >60 mL/min (ref >60)
Glucose, Bld: 122 mg/dL — ABNORMAL HIGH (ref 70–99)
Potassium: 3.9 mmol/L (ref 3.5–5.1)
Sodium: 134 mmol/L — ABNORMAL LOW (ref 135–145)

## 2020-05-15 LAB — CBC
HCT: 23.1 % — ABNORMAL LOW (ref 39.0–52.0)
Hemoglobin: 7.7 g/dL — ABNORMAL LOW (ref 13.0–17.0)
MCH: 32.4 pg (ref 26.0–34.0)
MCHC: 33.3 g/dL (ref 30.0–36.0)
MCV: 97.1 fL (ref 80.0–100.0)
Platelets: 136 10*3/uL — ABNORMAL LOW (ref 150–400)
RBC: 2.38 MIL/uL — ABNORMAL LOW (ref 4.22–5.81)
RDW: 14.2 % (ref 11.5–15.5)
WBC: 10.6 10*3/uL — ABNORMAL HIGH (ref 4.0–10.5)
nRBC: 0 % (ref 0.0–0.2)

## 2020-05-15 LAB — GLUCOSE, CAPILLARY
Glucose-Capillary: 108 mg/dL — ABNORMAL HIGH (ref 70–99)
Glucose-Capillary: 112 mg/dL — ABNORMAL HIGH (ref 70–99)
Glucose-Capillary: 114 mg/dL — ABNORMAL HIGH (ref 70–99)
Glucose-Capillary: 119 mg/dL — ABNORMAL HIGH (ref 70–99)
Glucose-Capillary: 130 mg/dL — ABNORMAL HIGH (ref 70–99)
Glucose-Capillary: 129 mg/dL — ABNORMAL HIGH (ref 70–99)
Glucose-Capillary: 133 mg/dL — ABNORMAL HIGH (ref 70–99)

## 2020-05-15 MED ORDER — LEVALBUTEROL HCL 0.63 MG/3ML IN NEBU: 0.6300 mg | INHALATION_SOLUTION | Freq: Four times a day (QID) | RESPIRATORY_TRACT | Status: DC | PRN

## 2020-05-15 MED ORDER — INSULIN ASPART 100 UNIT/ML ~~LOC~~ SOLN
0.0000 [IU] | Freq: Three times a day (TID) | SUBCUTANEOUS | Status: DC
  Administered 2020-05-15 – 2020-05-17 (×4): 2 [IU] via SUBCUTANEOUS

## 2020-05-15 NOTE — Progress Notes (Signed)
3 Days Post-Op Procedure(s) (LRB): BENTALL PROCEDURE (N/A) TRANSESOPHAGEAL ECHOCARDIOGRAM (TEE) (N/A) Subjective: Mild pain  Objective: Vital signs in last 24 hours: Temp:  [98.3 F (36.8 C)-99.5 F (37.5 C)] 98.3 F (36.8 C) (12/12 0755) Pulse Rate:  [82-95] 89 (12/12 0600) Cardiac Rhythm: Normal sinus rhythm (12/12 0400) Resp:  [8-35] 27 (12/12 0600) BP: (86-116)/(64-78) 112/72 (12/12 0600) SpO2:  [91 %-100 %] 92 % (12/12 0820) Weight:  [118.2 kg] 118.2 kg (12/12 0607)  Hemodynamic parameters for last 24 hours:    Intake/Output from previous day: 12/11 0701 - 12/12 0700 In: 924 [P.O.:480; I.V.:444] Out: 3625 [Urine:3625] Intake/Output this shift: No intake/output data recorded.  General appearance: alert and cooperative Neurologic: intact Heart: mildly tachycardiac Lungs: clear to auscultation bilaterally Abdomen: soft, non-tender; bowel sounds normal; no masses,  no organomegaly Extremities: extremities normal, atraumatic, no cyanosis or edema Wound: dressed, dry  Lab Results: Recent Labs    05/14/20 0345 05/15/20 0504  WBC 11.2* 10.6*  HGB 8.0* 7.7*  HCT 23.8* 23.1*  PLT 124* 136*   BMET:  Recent Labs    05/14/20 0345 05/15/20 0504  NA 132* 134*  K 4.0 3.9  CL 101 98  CO2 24 26  GLUCOSE 129* 122*  BUN 24* 23  CREATININE 1.26* 1.16  CALCIUM 8.1* 8.1*    PT/INR:  Recent Labs    05/12/20 1354  LABPROT 16.6*  INR 1.4*   ABG    Component Value Date/Time   PHART 7.365 05/12/2020 2037   HCO3 23.1 05/12/2020 2037   TCO2 24 05/12/2020 2037   ACIDBASEDEF 2.0 05/12/2020 2037   O2SAT 92.0 05/12/2020 2037   CBG (last 3)  Recent Labs    05/14/20 2338 05/15/20 0355 05/15/20 0753  GLUCAP 114* 112* 108*    Assessment/Plan: S/P Procedure(s) (LRB): BENTALL PROCEDURE (N/A) TRANSESOPHAGEAL ECHOCARDIOGRAM (TEE) (N/A) Mobilize Diuresis Plan for transfer to step-down: see transfer orders   LOS: 3 days    Wonda Olds 05/15/2020

## 2020-05-15 NOTE — Progress Notes (Signed)
Mobility Specialist - Progress Note   05/15/20 1629  Mobility  Activity Refused mobility   States he would like to rest today.   Pricilla Handler Mobility Specialist Mobility Specialist Phone: [phone]

## 2020-05-15 NOTE — Progress Notes (Signed)
Pt admitted from [place] to 4E. Telemetry box applied, CCMD notified. VSS. Educated on importance of incentive spirometer use. Applied 3.5 L [place] humidified. CHG bath completed. Pt oriented to room and staff. Call light in reach. Wife present in room.   Daymon Larsen, RN

## 2020-05-15 NOTE — Op Note (Signed)
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  Murvin Natal, MD  Assistant: Ivin Poot, MD  Anesthesia: General  Operative Findings:  Native bicuspid aortic valve  Preserved LV function  Well-seated aortic valve prosthesis    BRIEF CLINICAL NOTE AND INDICATIONS FOR SURGERY  [name] has been followed for bicuspid aortic valve disease and ascending aortic aneurysm.  He has gotten to the point where his aortic valve disease is at least moderate and trending toward severe.  At the same time he has a 5.2 to 5.3 cm ascending aortic aneurysm.  The patient has a history of coronary artery disease which has been treated with PCI.  He is taken to the operating room for replacement of the aortic valve and ascending aorta.  DETAILS OF THE OPERATIVE PROCEDURE  Preparation:  The patient is brought to the operating room on the above mentioned date and central monitoring was established by the anesthesia team including placement of Swan-Ganz catheter and radial arterial line. The patient is placed in the supine position on the operating table.  Intravenous antibiotics are administered. General endotracheal anesthesia is induced uneventfully. A Foley catheter is placed.  Baseline transesophageal echocardiogram was performed.  Findings were notable for preserved biventricular function and moderate to severe aortic valve disease  The patient's chest, abdomen, both groins, and both lower extremities are prepared  and draped in a sterile manner. A time out procedure is performed.   Surgical Approach and Conduit Harvest:  Attention was first turned to the right deltopectoral groove where an incision is made.  Dissection carried down to the subcutaneous tissue with electrocautery.  The pectoralis major minor muscles are divided.  The right axillary artery was encountered and encircled.  5000 units of heparin were given intravenously.  A Devon mm Hemashield gold graft is sewn end-to-side to the open axillary artery with a running suture of 5-0 Prolene.  This is then de-aired and attached to the arterial limb of the cardioplegic bypass instrument.  Median sternotomy is then undertaken.  Full dose heparin is given intravenously.  The pericardium is opened. The ascending aorta is significantly dilated in appearance. The right atrium is cannulated for cardiopulmonary bypass.  Adequate heparinization is verified.   A retrograde cardioplegia cannula is placed through the right atrium into the coronary sinus.  The left ventricular vent is inserted through the right superior pulmonary vein.  CO2 is blown into the field.  Cardiopulmonary bypass is then begun.   The patient is cooled to a goal systemic temperature of 24 degreesC.  The aortic cross clamp is applied and cold blood cardioplegia is delivered initially in an antegrade fashion through the aortic root.  Supplemental cardioplegia is given retrograde through the coronary sinus catheter.  Iced saline slush is applied for topical hypothermia.  The initial cardioplegic arrest is rapid with early diastolic arrest.  Repeat doses of cardioplegia are administered intermittently throughout the entire cross clamp portion of the operation through the aortic root, through the coronary sinus catheter, and directly into the coronary ostia in order to maintain completely flat electrocardiogram.    Aortic valve replacement and replacement of the  ascending aorta: The aorta is transected  at the level of the sinotubular junction.  The aortic valve is inspected and found to be by cuspid with fusion of the right and left coronary cusps.  The valve leaflets are excised and the annulus is debrided circumferentially.  Valve sizers measured the diameter of the annulus at 25 mm.  Circumferential sutures of 2-0 Ethibond with pledgets were placed on the ventricular side.  These were then brought through the sewing cuff of a 25 mm Inspiris valve.  The valve was then lowered into position and the sutures were tied with cor-knot crimping device.  Once the valve was secured the patient had been cooling for approximately 1 hour and the core temperature was at goal.  Circulatory arrest was then undertaken.  The innominate artery and left carotid artery were clamped allowing antegrade cerebral perfusion through the right axillary cannula and up the right carotid artery.  The aorta was transected to the underside of the arch.  A 30 mm Hemashield gold graft was brought onto the field and beveled.  This was then sewn to the underside of the aortic arch with a running suture of 3-0 Prolene.  When this completed the graft was de-aired and clamped.  Flow to the body was resumed.  The same piece of graft material was then anastomosed to the level of the sinotubular junction with a running suture of 3-0 Prolene.  Once this was completed de-airing procedures were performed and the aortic cross-clamp was removed.  Aggressive rewarming was undertaken.  Pacing wires inserted in the right ventricle and right atrium and exited inferiorly. the patient is rewarmed to 37C temperature. The patient is weaned and disconnected from cardiopulmonary bypass.  The patient's rhythm at separation from bypass was sinus bradycardia.  The patient was weaned from cardiopulmonary bypass  any inotropic support.   The aortic and venous cannula were removed uneventfully. Protamine was administered to reverse the anticoagulation.  The mediastinum  was drained using fluted chest tubes placed through separate stab incisions inferiorly.  The soft tissues anterior to the aorta were reapproximated loosely. The sternum is closed with double strength sternal wire. The soft tissues anterior to the sternum were closed in multiple layers and the skin is closed with a running subcuticular skin closure.  0The post-bypass portion of the operation was notable for stable rhythm and hemodynamics.     Disposition:  The patient tolerated the procedure well and is transported to the surgical intensive care in stable condition. There are no intraoperative complications. All sponge instrument and needle counts are verified correct at completion of the operation.    Jayme Cloud, MD 05/15/2020 1:52 PM

## 2020-05-16 ENCOUNTER — Inpatient Hospital Stay (HOSPITAL_COMMUNITY): Payer: Medicare Other

## 2020-05-16 LAB — CBC
HCT: 23.1 % — ABNORMAL LOW (ref 39.0–52.0)
Hemoglobin: 8 g/dL — ABNORMAL LOW (ref 13.0–17.0)
MCH: 33.1 pg (ref 26.0–34.0)
MCHC: 34.6 g/dL (ref 30.0–36.0)
MCV: 95.5 fL (ref 80.0–100.0)
Platelets: 150 10*3/uL (ref 150–400)
RBC: 2.42 MIL/uL — ABNORMAL LOW (ref 4.22–5.81)
RDW: 14.2 % (ref 11.5–15.5)
WBC: 7.2 10*3/uL (ref 4.0–10.5)
nRBC: 0 % (ref 0.0–0.2)

## 2020-05-16 LAB — GLUCOSE, CAPILLARY
Glucose-Capillary: 110 mg/dL — ABNORMAL HIGH (ref 70–99)
Glucose-Capillary: 107 mg/dL — ABNORMAL HIGH (ref 70–99)
Glucose-Capillary: 117 mg/dL — ABNORMAL HIGH (ref 70–99)
Glucose-Capillary: 91 mg/dL (ref 70–99)

## 2020-05-16 LAB — BASIC METABOLIC PANEL
Anion gap: 14 (ref 5–15)
Anion gap: 12 (ref 5–15)
BUN: 78 mg/dL — ABNORMAL HIGH (ref 8–23)
BUN: 25 mg/dL — ABNORMAL HIGH (ref 8–23)
CO2: 21 mmol/L — ABNORMAL LOW (ref 22–32)
CO2: 26 mmol/L (ref 22–32)
Calcium: 7.1 mg/dL — ABNORMAL LOW (ref 8.9–10.3)
Calcium: 8.2 mg/dL — ABNORMAL LOW (ref 8.9–10.3)
Chloride: 109 mmol/L (ref 98–111)
Chloride: 94 mmol/L — ABNORMAL LOW (ref 98–111)
Creatinine, Ser: 2.52 mg/dL — ABNORMAL HIGH (ref 0.61–1.24)
Creatinine, Ser: 1.16 mg/dL (ref 0.61–1.24)
GFR, Estimated: 27 mL/min — ABNORMAL LOW (ref >60)
GFR, Estimated: >60 mL/min (ref >60)
Glucose, Bld: 306 mg/dL — ABNORMAL HIGH (ref 70–99)
Glucose, Bld: 133 mg/dL — ABNORMAL HIGH (ref 70–99)
Potassium: 4 mmol/L (ref 3.5–5.1)
Potassium: 3.3 mmol/L — ABNORMAL LOW (ref 3.5–5.1)
Sodium: 144 mmol/L (ref 135–145)
Sodium: 132 mmol/L — ABNORMAL LOW (ref 135–145)

## 2020-05-16 LAB — PREPARE RBC (CROSSMATCH)

## 2020-05-16 MED ORDER — SODIUM CHLORIDE 0.9% IV SOLUTION: Freq: Once | INTRAVENOUS | Status: AC

## 2020-05-16 MED ORDER — POTASSIUM CHLORIDE CRYS ER 20 MEQ PO TBCR
40.0000 meq | EXTENDED_RELEASE_TABLET | Freq: Two times a day (BID) | ORAL | Status: AC
  Administered 2020-05-16 – 2020-05-17 (×4): 40 meq via ORAL
  Filled 2020-05-16 (×4): qty 2

## 2020-05-16 MED ORDER — FUROSEMIDE 10 MG/ML IJ SOLN
40.0000 mg | Freq: Every day | INTRAMUSCULAR | Status: DC
  Administered 2020-05-17: 40 mg via INTRAVENOUS
  Filled 2020-05-16: qty 4

## 2020-05-16 MED FILL — Potassium Chloride Inj 2 mEq/ML: INTRAVENOUS | Qty: 40 | Status: AC

## 2020-05-16 MED FILL — Lidocaine HCl Local Preservative Free (PF) Inj 2%: INTRAMUSCULAR | Qty: 15 | Status: AC

## 2020-05-16 MED FILL — Mannitol IV Soln 20%: INTRAVENOUS | Qty: 500 | Status: AC

## 2020-05-16 MED FILL — Sodium Bicarbonate IV Soln 8.4%: INTRAVENOUS | Qty: 50 | Status: AC

## 2020-05-16 MED FILL — Heparin Sodium (Porcine) Inj 1000 Unit/ML: INTRAMUSCULAR | Qty: 10 | Status: AC

## 2020-05-16 MED FILL — Electrolyte-R (PH 7.4) Solution: INTRAVENOUS | Qty: 6000 | Status: AC

## 2020-05-16 MED FILL — Lidocaine HCl Local Soln Prefilled Syringe 100 MG/5ML (2%): INTRAMUSCULAR | Qty: 5 | Status: AC

## 2020-05-16 MED FILL — Thrombin For Soln 5000 Unit: CUTANEOUS | Qty: 5000 | Status: AC

## 2020-05-16 MED FILL — Magnesium Sulfate Inj 50%: INTRAMUSCULAR | Qty: 10 | Status: AC

## 2020-05-16 MED FILL — Albumin, Human Inj 5%: INTRAVENOUS | Qty: 250 | Status: AC

## 2020-05-16 MED FILL — Calcium Chloride Inj 10%: INTRAVENOUS | Qty: 20 | Status: AC

## 2020-05-16 MED FILL — Heparin Sodium (Porcine) Inj 1000 Unit/ML: INTRAMUSCULAR | Qty: 30 | Status: AC

## 2020-05-16 MED FILL — Sodium Chloride IV Soln 0.9%: INTRAVENOUS | Qty: 3000 | Status: AC

## 2020-05-16 NOTE — Addendum Note (Signed)
Addendum  created 05/16/20 1114 by Josephine Igo, CRNA   Order list changed, Pharmacy for encounter modified

## 2020-05-16 NOTE — Progress Notes (Signed)
4 Days Post-Op Procedure(s) (LRB): BENTALL PROCEDURE (N/A) TRANSESOPHAGEAL ECHOCARDIOGRAM (TEE) (N/A) Subjective: Feeling tired  Objective: Vital signs in last 24 hours: Temp:  [98 F (36.7 C)-99 F (37.2 C)] 98 F (36.7 C) (12/13 0808) Pulse Rate:  [83-98] 92 (12/13 0808) Cardiac Rhythm: Sinus tachycardia;Bundle branch block (12/12 1929) Resp:  [19-21] 20 (12/13 0808) BP: (108-118)/(63-80) 118/76 (12/13 0808) SpO2:  [92 %-96 %] 95 % (12/13 0808) Weight:  [847 kg] 116 kg (12/13 0419)  Hemodynamic parameters for last 24 hours:    Intake/Output from previous day: 12/12 0701 - 12/13 0700 In: 721 [P.O.:721] Out: 2525 [Urine:2525] Intake/Output this shift: Total I/O In: -  Out: 225 [Urine:225]  General appearance: alert and cooperative Neurologic: intact Heart: regular rate and rhythm, S1, S2 normal, no murmur, click, rub or gallop Lungs: clear to auscultation bilaterally Abdomen: soft, non-tender; bowel sounds normal; no masses,  no organomegaly Extremities: edema mild Wound: c/d/i  Lab Results: Recent Labs    05/16/20 0500 05/16/20 0810  WBC PATIENT IDENTIFICATION ERROR. PLEASE DISREGARD RESULTS. ACCOUNT WILL BE CREDITED. 7.2  HGB PATIENT IDENTIFICATION ERROR. PLEASE DISREGARD RESULTS. ACCOUNT WILL BE CREDITED. 8.0*  HCT PATIENT IDENTIFICATION ERROR. PLEASE DISREGARD RESULTS. ACCOUNT WILL BE CREDITED. 23.1*  PLT PATIENT IDENTIFICATION ERROR. PLEASE DISREGARD RESULTS. ACCOUNT WILL BE CREDITED. 150   BMET:  Recent Labs    05/15/20 0504 05/16/20 0500  NA 134* 144  K 3.9 4.0  CL 98 109  CO2 26 21*  GLUCOSE 122* 306*  BUN 23 78*  CREATININE 1.16 2.52*  CALCIUM 8.1* 7.1*    PT/INR: No results for input(s): LABPROT, INR in the last 72 hours. ABG    Component Value Date/Time   PHART 7.365 05/12/2020 2037   HCO3 23.1 05/12/2020 2037   TCO2 24 05/12/2020 2037   ACIDBASEDEF 2.0 05/12/2020 2037   O2SAT 92.0 05/12/2020 2037   CBG (last 3)  Recent Labs     05/15/20 1636 05/15/20 2125 05/16/20 0621  GLUCAP 130* 129* 110*    Assessment/Plan: S/P Procedure(s) (LRB): BENTALL PROCEDURE (N/A) TRANSESOPHAGEAL ECHOCARDIOGRAM (TEE) (N/A) Mobilize Diuresis check labs  PT/OT May be able to discharge tomorrow   LOS: 4 days    Wonda Olds 05/16/2020

## 2020-05-16 NOTE — Progress Notes (Addendum)
Physical Therapy Treatment Patient Details Name: Devon Sanchez MRN: [id number] DOB: 03-23-1951 Today's Date: 05/16/2020    History of Present Illness 69 year old gentleman presents for consultation regarding moderate aortic valve disease and 5.2 cm ascending aortic aneurysm. Experiencing SoB and mild chest pain with exertion. 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    PT Comments    Pt supine in bed on arrival.  Pt tolerated session well.  Limited by SPo2 at times.  Pt continues to improve.  Plan for stair training next session.      Follow Up Recommendations  Home health PT;Supervision/Assistance - 24 hour     Equipment Recommendations  Standard walker;3in1 (PT)    Recommendations for Other Services       Precautions / Restrictions Precautions Precautions: Sternal Precaution Comments: able to recall most of precautions Restrictions Weight Bearing Restrictions: Yes (sternal precautions.) Other Position/Activity Restrictions: sternal precautions    Mobility  Bed Mobility Overal bed mobility: Needs Assistance Bed Mobility: Supine to Sit         Sit to sidelying: Mod assist;Max assist General bed mobility comments: Attempted rolling but patient not assisting.  Performed flat spin with trunk elevation while patient hugging heart pillow.  Use of chuck pad to scoot patient to edge of bed.  Transfers Overall transfer level: Needs assistance Equipment used: 4-wheeled walker Transfers: Sit to/from Stand Sit to Stand: Min assist         General transfer comment: Min assistance with cues for hand placement on knee to push into standing.  Pt utilized rocking momentum to achieve standing.  Ambulation/Gait Ambulation/Gait assistance: Min guard Gait Distance (Feet): 20 Feet (+ 75 ft) Assistive device: 4-wheeled walker Gait Pattern/deviations: Step-through pattern;Decreased step length - right;Decreased step length - left;Shuffle      General Gait Details: Cues for use of rollator and use of brakes when needing to turn and sit for rest period.  Attempted on RA and SPo2 decreased to 88% required 2-3L to maintain SPo2, however waveform appears unreliable so may need to be reassessed.   Stairs             Wheelchair Mobility    Modified Rankin (Stroke Patients Only)       Balance Overall balance assessment: Needs assistance Sitting-balance support: No upper extremity supported Sitting balance-Leahy Scale: Fair       Standing balance-Leahy Scale: Poor                              Cognition Arousal/Alertness: Awake/alert Behavior During Therapy: WFL for tasks assessed/performed Overall Cognitive Status: Within Functional Limits for tasks assessed                                        Exercises Other Exercises Other Exercises: IS x 10 reps.  Cues for technique.  Quality 750 ml.    General Comments        Pertinent Vitals/Pain Pain Assessment: Faces Faces Pain Scale: Hurts a little bit Pain Descriptors / Indicators: Operative site guarding;Grimacing;Sharp;Stabbing Pain Intervention(s): Monitored during session;Repositioned    Home Living                      Prior Function            PT Goals (current goals can now be found in  the care plan section) Acute Rehab PT Goals Patient Stated Goal: I hope to go home and feel better Potential to Achieve Goals: Good Progress towards PT goals: Progressing toward goals    Frequency    Min 3X/week      PT Plan Current plan remains appropriate    Co-evaluation              AM-PAC PT "6 Clicks" Mobility   Outcome Measure  Help needed turning from your back to your side while in a flat bed without using bedrails?: A Lot Help needed moving from lying on your back to sitting on the side of a flat bed without using bedrails?: A Lot Help needed moving to and from a bed to a chair (including a  wheelchair)?: A Little Help needed standing up from a chair using your arms (e.g., wheelchair or bedside chair)?: A Little Help needed to walk in hospital room?: A Little Help needed climbing 3-5 steps with a railing? : A Little 6 Click Score: 16    End of Session Equipment Utilized During Treatment: Oxygen Activity Tolerance: Patient limited by pain Patient left: in bed;with call bell/phone within reach;with nursing/sitter in room Nurse Communication: Mobility status PT Visit Diagnosis: Unsteadiness on feet (R26.81);Other abnormalities of gait and mobility (R26.89);Muscle weakness (generalized) (M62.81);Pain     Time: 3976-7341 PT Time Calculation (min) (ACUTE ONLY): 32 min  Charges:  $Gait Training: 8-22 mins $Therapeutic Activity: 8-22 mins                     Erasmo Leventhal , PTA Acute Rehabilitation Services Pager [phone] Office [phone]     [name] 05/16/2020, 12:48 PM

## 2020-05-16 NOTE — Progress Notes (Signed)
CARDIAC REHAB PHASE I   Offered to walk with pt. Pt declining at this time stating he needs rest. Pt educated on importance of ambulation. Will return later to walk as able.  Rufina Falco, RN BSN 05/16/2020 9:47 AM

## 2020-05-16 NOTE — Plan of Care (Signed)
  Problem: Education: Goal: Knowledge of disease or condition will improve Outcome: Progressing Goal: Knowledge of the prescribed therapeutic regimen will improve Outcome: Progressing   Problem: Activity: Goal: Risk for activity intolerance will decrease Outcome: Progressing   

## 2020-05-16 NOTE — Progress Notes (Signed)
Lab called this nurse to notify that labs resulted on this patient were from another   Patient on a different floor, will reorder labs

## 2020-05-16 NOTE — Discharge Instructions (Signed)

## 2020-05-16 NOTE — Progress Notes (Signed)
Pt HR increased to 130-140s sustaining. EKG obtained showed ST. 5mg  IV metoprolol given. Blood transfusion paused. Temp 98.7 oral, BP 121/80, oxygen 96% on 3L [place]. Jadene Pierini PA paged. Orders received to given scheduled PO metoprolol early and restart blood transfusion. Yellow MEWS protocol started. Will continue to monitor.  Clyde Canterbury, RN

## 2020-05-16 NOTE — Progress Notes (Signed)
EPW's removed per MD order without difficulty.  Pt educated on bedrest for an hour and BP's being cycled.  Will continue to monitor.

## 2020-05-16 NOTE — Progress Notes (Signed)
OT Cancellation Note  Patient Details Name: Devon Sanchez MRN: [id number] DOB: 01/06/1951   Cancelled Treatment:    Reason Eval/Treat Not Completed: Patient at procedure or test/ unavailable;Other (comment) pt working with Cardiac rehab, will check back as time allows.  Lanier Clam., COTA/L Acute Rehabilitation Services [phone] [place] 05/16/2020, 2:27 PM

## 2020-05-16 NOTE — Progress Notes (Signed)
Mobility Specialist - Progress Note   05/16/20 1644  Mobility  Activity Ambulated in hall  Level of Assistance Standby assist, set-up cues, supervision of patient - no hands on  Assistive Device Four wheel walker  Distance Ambulated (ft) 450 ft  Mobility Response Tolerated well  Mobility performed by Mobility specialist  $Mobility charge 1 Mobility    Pre-mobility: 88 HR, 97% SpO2 During mobility: 100 HR Post-mobility: 87 HR, 93% SpO2  Ambulated on 3L O2. Pt said he felt "sluggish" while ambulating, but was otherwise asx. Pt in bed after walk to eat dinner.   Pricilla Handler Mobility Specialist Mobility Specialist Phone: [phone]

## 2020-05-16 NOTE — Progress Notes (Signed)
CARDIAC REHAB PHASE I   PRE:  Rate/Rhythm: 84 BBB with PVC  BP:  Sitting: 111/75      SaO2: 96 3L --> 87 RA  MODE:  Ambulation: 65 ft   POST:  Rate/Rhythm: 96 BBB with PVCs  BP:  Sitting: 116/66    SaO2: 95 2L   After some convincing, pt agreeable to ambulate. Pt ambulated 53ft in hallway assist of one with rollator. Pt states no "get-up-and-go" today. Pt returned to recliner. Able to demonstrate 875 on IS. Encouraged continued ambulation and IS use. Will continue to follow.  [phone] Rufina Falco, RN BSN 05/16/2020 2:51 PM

## 2020-05-16 NOTE — Discharge Summary (Signed)
Physician Discharge Summary  Patient ID: Devon Sanchez MRN: [id number] DOB/AGE: 09-08-1950 69 y.o.  Admit date: 05/12/2020 Discharge date: 05/19/2020  Admission Diagnoses:  Ascending aortic aneurysm Aortic valve stenosis Bicuspid aortic valve Coronary artery disease History of NSTEMI Dyslipidemia    Discharge Diagnoses:   Ascending aortic aneurysm Aortic valve stenosis Bicuspid aortic valve Coronary artery disease History of NSTEMI Dyslipidemia S/P aortic aneurysm repair Expected acute blood loss anemia Bundle branch block   Patient Active Problem List   Diagnosis Date Noted  . S/P aortic aneurysm repair 05/12/2020  . Idiopathic medial aortopathy and arteriopathy ([place]) 04/20/2020  . Encounter for preoperative assessment for noncoronary cardiac surgery 01/13/2020  . Aortic stenosis with bicuspid valve 12/23/2019  . Bicuspid aortic valve 07/25/2018  . Aortic root dilatation ([place]) 07/25/2018  . Vivid dream 12/06/2016  . OSA on CPAP 11/03/2015  . PTSD (post-traumatic stress disorder) 11/03/2015  . Atherosclerotic heart disease 04/21/2013  . NSTEMI (non-ST elevated myocardial infarction) ([place]) 04/21/2013  . Hyperlipidemia 04/21/2013  . Hyperglycemia 04/21/2013     Discharged Condition: good   History of Present Illness: 69 year old gentleman presents for consultation regarding moderate aortic valve disease and ascending aortic aneurysm.  He has been followed by Dr. Prescott Gum, who has kindly asked me to see this patient.  The patient has also been followed by Dr. 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  He has a 5.2 cm ascending aortic aneurysm.  The patient endorses shortness of breath and mild chest pain with exertion.  He also reports occasional skipped beats or palpitation but there has been no documented atrial fibrillation   Postoperative Course in Hospital: [name] was admitted for elective surgery  on 05/13/20 and taken to the OR where the aortic valve and ascending aorta were replaced Deneen Harts Procedure). Following the procedure he separated from cardiopulmonary bypass without difficulty and was transferred to the ICU hemodynamically stable. He was weaned from mechanical ventilator support and was extubated by 8pm on the day of surgery. He was mobilized on the first post-op day with physical therapy.  He has maintained stable hemodynamics but did have postoperative atrial fibrillation.  He has subsequently been chemically cardioverted to sinus rhythm with oral amiodarone.  He does have occasional PVCs.  He has had some volume overload but is responding well to diuretics which are decreasing over time.  He has required transfusion for expected acute blood loss anemia but hemoglobin hematocrit have stabilized.  He is also on iron supplementation.  Blood sugars have been under adequate control using standard protocols.  He did have a postoperative thrombocytopenia but this has improved over time and platelets are now in the normal range.  He has required aggressive pulmonary toilet and oxygen is slowly being weaned.  On 05/18/2020 he was only on 1 L by nasal cannula.  This was weaned off prior to discharge.  He is doing well in his physical recovery and physical therapy has recommended home health PT at the time of discharge and this has been arranged.  His incisions are noted to be healing well.  At the time of discharge the patient is felt to be quite stable.    Consults: None  Significant Diagnostic Studies:   RIGHT/LEFT HEART CATH AND CORONARY ANGIOGRAPHY    Conclusion  LM: Normal LAD: Patent prox LAD stent with 20% late lumen loss at proximal edge LCx: Patent prox stent  Ramus: Normal RCA: 95% stenosis in small RV marginal  branch, unchanged compared to previous angiogram in 2014  Normal RHC. No pulmonary hypertension    Coronary Diagrams   Diagnostic Dominance:  Right       CORONARY CT  ADDENDUM REPORT: 04/10/2020 13:44  CLINICAL DATA:  69 year old male with CAD s/p LAD and LCx PCI, moderate bicuspid aortic stenosis with bicuspid aortopathy, hypertension, hyperlipidemia, OSA scheduled for AVR and replacement of ascending aorta.  EXAM: Cardiac/Coronary  CTA  TECHNIQUE: The patient was scanned on a [employer].  FINDINGS: A 100 kV prospective scan was triggered in the descending thoracic aorta at 111 HU's. Axial non-contrast 3 mm slices were carried out through the heart. The data set was analyzed on a dedicated work station and scored using the Bacon. Gantry rotation speed was 250 msecs and collimation was .6 mm. 50 mg of PO Metoprolol and 0.8 mg of sl NTG was given. The 3D data set was reconstructed in 5% intervals of the 67-82 % of the R-R cycle. Diastolic phases were analyzed on a dedicated work station using MPR, MIP and VRT modes. The patient received 80 cc of contrast.  Aorta: Severely dilated ascending aortic aneurysm with maximum diameter 52 mm. There are moderate diffuse calcifications and atherosclerotic plaque, no dissection.  Sinotubular Junction: 40 x 39 mm  Ascending Thoracic Aorta: 52 x 52 mm  Aortic Arch: 32 x 31 mm  Descending Thoracic Aorta: 31 x 29 mm  Aortic Valve: Bileaflet with severely calcified leaflet with severely restricted leaflet opening. No calcifications.  Sinus of Valsalva Measurements: 42 x 35 mm  Coronary Arteries:  Normal coronary origin.  Right dominance.  RCA is a large dominant artery that gives rise to PDA and PLA. There is minimal mixed plaque in the mid segment with stenosis 0-25%.  Left main is a large artery that gives rise to LAD, ramus intermedius and LCX arteries. Left main has minimal eccentric calcified plaque in the distal portion with stenosis 0-25%.  LAD is a large vessel that has a long stent in the entire proximal portion. The  stent is clearly patent with minimal irregularities in its proximal portion. Mid and distal LAD has minimal irregularities.  Ramus intermedius is a medium caliber artery that has mild calcified plaque in the proximal portion with stenosis 25-49%.  LCX is a non-dominant artery that has a stent in the proximal portion. Proximal LAD stent is patent with minimal irregularities. Mid and distal LCX artery has small lumen and no obvious plaque.  Other findings:  Normal pulmonary vein drainage into the left atrium.  Normal left atrial appendage without a thrombus.  Normal size of the pulmonary artery.  IMPRESSION: 1. Normal coronary origin with right dominance.  2. Patent stents in the proximal LAD and proximal portion of LCX arteries with only minimal in-stent restenosis. Otherwise mild diffuse non-obstructive CAD.  3. Aorta: Severely dilated ascending aortic aneurysm with maximum diameter 52 mm. There are moderate diffuse calcifications and atherosclerotic plaque, no dissection. Normal size of the aortic arch and descending thoracic aorta.   Electronically Signed   By: Ena Dawley   On: 04/10/2020 13:44   Treatments:  CARDIOTHORACIC SURGERY OPERATIVE NOTE  Date of Procedure:    05/12/20 Preoperative Diagnosis:      Bicuspid aortic valve with moderate to severe aortic valve stenosis and ascending aortic aneurysm  Postoperative Diagnosis:    Same  Procedure:        Aortic valve replacement (25 mm Inspiris bovine bioprosthetic)  Supracoronary replacement of ascending aortic  aneurysm with distal hemiarch reconstruction under moderate hypothermic circulatory arrest using antegrade cerebral perfusion  Right axillary artery cannulation  Surgeon:        B.  Murvin Natal, MD  Assistant:       Ivin Poot, MD  Anesthesia:    General  Operative Findings: ? Native bicuspid aortic valve ? Preserved LV function ? Well-seated aortic valve  prosthesis    BRIEF CLINICAL NOTE AND INDICATIONS FOR SURGERY  69 year old gentleman has been followed for bicuspid aortic valve disease and ascending aortic aneurysm.  He has gotten to the point where his aortic valve disease is at least moderate and trending toward severe.  At the same time he has a 5.2 to 5.3 cm ascending aortic aneurysm.  The patient has a history of coronary artery disease which has been treated with PCI.  He is taken to the operating room for replacement of the aortic valve and ascending aorta.    Discharge Exam: Blood pressure 102/73, pulse 70, temperature 98.4 F (36.9 C), temperature source Oral, resp. rate 20, height 6\' 2"  (1.88 m), weight 112.6 kg, SpO2 95 %.   General appearance: alert, cooperative and no distress Heart: regular rate and rhythm Lungs: clear to auscultation bilaterally Abdomen: benign Extremities: no edema Wound: incis healing well  Disposition: Discharge disposition: 01-Home or Self Care       Discharge Instructions    AMB Referral to Cardiac Rehabilitation - Phase II   Complete by: As directed    Diagnosis: Valve Replacement   Valve: Aortic   After initial evaluation and assessments completed: Virtual Based Care may be provided alone or in conjunction with Phase 2 Cardiac Rehab based on patient barriers.: Yes   Discharge patient   Complete by: As directed    Discharge disposition: 01-Home or Self Care   Discharge patient date: 05/19/2020     Allergies as of 05/19/2020      Reactions   Bupropion Other (See Comments)   MADE CHEST HURT   Naproxen Sodium Other (See Comments)   GI BLEEDING      Medication List    STOP taking these medications   aspirin 81 MG tablet Replaced by: aspirin 325 MG EC tablet   carvedilol 6.25 MG tablet Commonly known as: COREG   escitalopram 10 MG tablet Commonly known as: LEXAPRO   gabapentin 100 MG capsule Commonly known as: NEURONTIN   losartan 50 MG tablet Commonly known as:  COZAAR   methocarbamol 750 MG tablet Commonly known as: ROBAXIN   nitroGLYCERIN 0.4 MG SL tablet Commonly known as: NITROSTAT   sildenafil 100 MG tablet Commonly known as: VIAGRA     TAKE these medications   acetaminophen 500 MG tablet Commonly known as: TYLENOL Take 500 mg by mouth every 6 (six) hours as needed for moderate pain or headache.   amiodarone 200 MG tablet Commonly known as: PACERONE Take 1 tablet (200 mg total) by mouth 2 (two) times daily.   aspirin 325 MG EC tablet Take 1 tablet (325 mg total) by mouth daily. Replaces: aspirin 81 MG tablet   CENTRUM SILVER PO Take 1 tablet by mouth every evening.   ferrous [id number] C-folic acid capsule Commonly known as: TRINSICON / FOLTRIN Take 1 capsule by mouth 2 (two) times daily after a meal.   metoprolol tartrate 25 MG tablet Commonly known as: LOPRESSOR Take 1 tablet (25 mg total) by mouth 2 (two) times daily.   pantoprazole 40 MG tablet Commonly known as: PROTONIX Take  40 mg by mouth daily.   polyethylene glycol 17 g packet Commonly known as: MIRALAX / GLYCOLAX Take 17 g by mouth daily.   rosuvastatin 20 MG tablet Commonly known as: CRESTOR Take 1 tablet (20 mg total) by mouth daily.   traMADol 50 MG tablet Commonly known as: ULTRAM Take 1 tablet (50 mg total) by mouth every 6 (six) hours as needed for moderate pain.            Durable Medical Equipment  (From admission, onward)         Start     Ordered   05/17/20 0923  For home use only DME 4 wheeled rolling walker with seat  Once       Question:  Patient needs a walker to treat with the following condition  Answer:  S/P aortic valve replacement with prosthetic valve   05/17/20 0923          Follow-up Information    Adrian Prows, MD. Go on 07/06/2020.   Specialty: Cardiology Why: Your appointment is a 3PM.  Contact information: [place] [address] [phone]        Wonda Olds, MD  Follow up.   Specialty: Cardiothoracic Surgery Contact information: [address] [place] [address] [phone]        [facility] Follow up.   Specialty: Home Health Services Why: HHPT/OT arranged- they will contact you post discharge to schedule visits Contact information: [place] [address] [phone]        [facility] Follow up.   Why: rollator arranged- to be delivered to room prior to discharge Contact information: [address] [place] [address] [phone]              The patient has been discharged on:   1.Beta Blocker:  Yes [ x  ]                              No   [   ]                              If No, reason:  2.Ace Inhibitor/ARB: Yes [   ]                                     No  [  x  ]                                     If No, reason: BP too soft 3.Statin:   Yes [ x  ]                  No  [   ]                  If No, reason:  4.Ecasa:  Yes  [ x  ]                  No   [   ]                  If No, reason:     Signed:  [place] PA-C 05/19/2020, 8:35 AM

## 2020-05-16 NOTE — Progress Notes (Signed)
4 Days Post-Op Procedure(s) (LRB): BENTALL PROCEDURE (N/A) TRANSESOPHAGEAL ECHOCARDIOGRAM (TEE) (N/A) Subjective: Sore and fatigued nsr cxr clear  Objective: Vital signs in last 24 hours: Temp:  [98 F (36.7 C)-99 F (37.2 C)] 98 F (36.7 C) (12/13 0808) Pulse Rate:  [83-98] 92 (12/13 0808) Cardiac Rhythm: Sinus tachycardia;Bundle branch block (12/12 1929) Resp:  [19-21] 20 (12/13 0808) BP: (108-118)/(63-80) 118/76 (12/13 0808) SpO2:  [92 %-96 %] 95 % (12/13 0808) Weight:  [342 kg] 116 kg (12/13 0419)         Exam    General- alert and comfortable    Neck- no JVD, no cervical adenopathy palpable, no carotid bruit   Lungs- clear without rales, wheezes   Cor- regular rate and rhythm, no murmur , gallop   Abdomen- soft, non-tender   Extremities - warm, non-tender, minimal edema   Neuro- oriented, appropriate, no focal weakness  HemodYNAMICS STABLE Intake/Output from previous day: 12/12 0701 - 12/13 0700 In: 721 [P.O.:721] Out: 2525 [Urine:2525] Intake/Output this shift: Total I/O In: -  Out: 225 [Urine:225]    Lab Results: Recent Labs    05/16/20 0500 05/16/20 0810  WBC PATIENT IDENTIFICATION ERROR. PLEASE DISREGARD RESULTS. ACCOUNT WILL BE CREDITED. 7.2  HGB PATIENT IDENTIFICATION ERROR. PLEASE DISREGARD RESULTS. ACCOUNT WILL BE CREDITED. 8.0*  HCT PATIENT IDENTIFICATION ERROR. PLEASE DISREGARD RESULTS. ACCOUNT WILL BE CREDITED. 23.1*  PLT PATIENT IDENTIFICATION ERROR. PLEASE DISREGARD RESULTS. ACCOUNT WILL BE CREDITED. 150   BMET:  Recent Labs    05/15/20 0504 05/16/20 0500  NA 134* 144  K 3.9 4.0  CL 98 109  CO2 26 21*  GLUCOSE 122* 306*  BUN 23 78*  CREATININE 1.16 2.52*  CALCIUM 8.1* 7.1*    PT/INR: No results for input(s): LABPROT, INR in the last 72 hours. ABG    Component Value Date/Time   PHART 7.365 05/12/2020 2037   HCO3 23.1 05/12/2020 2037   TCO2 24 05/12/2020 2037   ACIDBASEDEF 2.0 05/12/2020 2037   O2SAT 92.0 05/12/2020 2037    CBG (last 3)  Recent Labs    05/15/20 1636 05/15/20 2125 05/16/20 0621  GLUCAP 130* 129* 110*    Assessment/Plan: S/P Procedure(s) (LRB): BENTALL PROCEDURE (N/A) TRANSESOPHAGEAL ECHOCARDIOGRAM (TEE) (N/A) Mobilize Diuresis repeat am labwork due to lab error  Expected postop blood loss anemia- cont po Iron   LOS: 4 days    Devon Sanchez 05/16/2020

## 2020-05-17 LAB — BPAM RBC
Blood Product Expiration Date: 202201022359
Blood Product Expiration Date: 202201082359
Blood Product Expiration Date: 202201082359
Blood Product Expiration Date: 202201082359
Blood Product Expiration Date: 202201022359
ISSUE DATE / TIME: 202112111749
ISSUE DATE / TIME: 202112130320
ISSUE DATE / TIME: 202112131643
ISSUE DATE / TIME: 202112140554
ISSUE DATE / TIME: 202112131643
Unit Type and Rh: 6200
Unit Type and Rh: 6200
Unit Type and Rh: 6200
Unit Type and Rh: 6200
Unit Type and Rh: 6200

## 2020-05-17 LAB — CBC
HCT: 26.4 % — ABNORMAL LOW (ref 39.0–52.0)
Hemoglobin: 9.4 g/dL — ABNORMAL LOW (ref 13.0–17.0)
MCH: 33.6 pg (ref 26.0–34.0)
MCHC: 35.6 g/dL (ref 30.0–36.0)
MCV: 94.3 fL (ref 80.0–100.0)
Platelets: 164 10*3/uL (ref 150–400)
RBC: 2.8 MIL/uL — ABNORMAL LOW (ref 4.22–5.81)
RDW: 14.1 % (ref 11.5–15.5)
WBC: 6.8 10*3/uL (ref 4.0–10.5)
nRBC: 0.6 % — ABNORMAL HIGH (ref 0.0–0.2)

## 2020-05-17 LAB — COMPREHENSIVE METABOLIC PANEL
ALT: 38 U/L (ref 0–44)
AST: 36 U/L (ref 15–41)
Albumin: 2.8 g/dL — ABNORMAL LOW (ref 3.5–5.0)
Alkaline Phosphatase: 89 U/L (ref 38–126)
Anion gap: 11 (ref 5–15)
BUN: 25 mg/dL — ABNORMAL HIGH (ref 8–23)
CO2: 27 mmol/L (ref 22–32)
Calcium: 8.6 mg/dL — ABNORMAL LOW (ref 8.9–10.3)
Chloride: 95 mmol/L — ABNORMAL LOW (ref 98–111)
Creatinine, Ser: 1.16 mg/dL (ref 0.61–1.24)
GFR, Estimated: >60 mL/min (ref >60)
Glucose, Bld: 116 mg/dL — ABNORMAL HIGH (ref 70–99)
Potassium: 4 mmol/L (ref 3.5–5.1)
Sodium: 133 mmol/L — ABNORMAL LOW (ref 135–145)
Total Bilirubin: 1.4 mg/dL — ABNORMAL HIGH (ref 0.3–1.2)
Total Protein: 6.4 g/dL — ABNORMAL LOW (ref 6.5–8.1)

## 2020-05-17 LAB — TYPE AND SCREEN
ABO/RH(D): A POS
ABO/RH(D): A POS
Antibody Screen: NEGATIVE
Antibody Screen: NEGATIVE
Unit division: 0
Unit division: 0
Unit division: 0
Unit division: 0
Unit division: 0

## 2020-05-17 LAB — GLUCOSE, CAPILLARY
Glucose-Capillary: 106 mg/dL — ABNORMAL HIGH (ref 70–99)
Glucose-Capillary: 123 mg/dL — ABNORMAL HIGH (ref 70–99)
Glucose-Capillary: 101 mg/dL — ABNORMAL HIGH (ref 70–99)
Glucose-Capillary: 130 mg/dL — ABNORMAL HIGH (ref 70–99)

## 2020-05-17 MED ORDER — FUROSEMIDE 10 MG/ML IJ SOLN
60.0000 mg | Freq: Two times a day (BID) | INTRAMUSCULAR | Status: DC
  Administered 2020-05-17: 60 mg via INTRAVENOUS
  Filled 2020-05-17 (×2): qty 6

## 2020-05-17 MED ORDER — AMIODARONE HCL 200 MG PO TABS
400.0000 mg | ORAL_TABLET | Freq: Two times a day (BID) | ORAL | Status: DC
  Administered 2020-05-17 – 2020-05-19 (×5): 400 mg via ORAL
  Filled 2020-05-17 (×5): qty 2

## 2020-05-17 NOTE — Progress Notes (Signed)
Patient resting in bed and H.R. 130-140's At. Fib. B.P. 125/81 Lopressor 2.5 mg I.V. given and patient converted back to S.R. 70-80

## 2020-05-17 NOTE — Progress Notes (Addendum)
Patient resting in bed H.R. 130's, At. Fib. B.P. 104/87 Lopressor 2.5 mg I.V. given and patient immED. Went back into S.R. 60-80 Reden R.N. aware.

## 2020-05-17 NOTE — Progress Notes (Signed)
Mobility Specialist - Progress Note   05/17/20 1416  Mobility  Activity Transferred:  Chair to bed  Level of Assistance Minimal assist, patient does 75% or more  Assistive Device Front wheel walker  Mobility Response Tolerated fair  Mobility performed by Mobility specialist  $Mobility charge 1 Mobility    Pre-mobility: 80 HR, 106/77 BP, 96% SpO2 During mobility: 135 HR Post-mobility: 78 HR, 110/73 BP, 94% SpO2  Pt refused ambulating out in the hall as he states he hasn't felt well since ambulating earlier this am. When standing, pt experienced sustained tachycardia that lasted for several minutes when laying down in bed. Min assist to stand, stand by to pivot, min assist to lift legs when laying down in bed.   Pricilla Handler Mobility Specialist Mobility Specialist Phone: [phone]

## 2020-05-17 NOTE — Progress Notes (Signed)
PT Cancellation Note  Patient Details Name: Devon Sanchez MRN: [id number] DOB: 1950-09-18   Cancelled Treatment:    Reason Eval/Treat Not Completed: (P) Patient declined, no reason specified Pt is just getting back to bed with Mobility Tech. Pt will follow back for stair training tomorrow.   Lasonja Lakins B. Migdalia Dk PT, DPT Acute Rehabilitation Services Pager [phone] Office [phone]    [place] 05/17/2020, 3:22 PM

## 2020-05-17 NOTE — Progress Notes (Signed)
Patient resting in bed with no complaints and H.R. went up to 130's S.T. ,B.P. 120/89 . Lopressor 2.5 mg I.V. given per PRN orders. Reden R.n. aware . H.R. went down to the 27's S.R. at 00:36

## 2020-05-17 NOTE — Care Management Important Message (Signed)
Important Message  Patient Details  Name: Devon Sanchez MRN: [id number] Date of Birth: 01/09/1951   Medicare Important Message Given:  Yes     Barb Merino [place] 05/17/2020, 3:13 PM

## 2020-05-17 NOTE — Progress Notes (Signed)
Mobility Specialist - Progress Note   05/17/20 1557  Mobility  Activity Ambulated in hall  Level of Assistance Minimal assist, patient does 75% or more  Assistive Device Four wheel walker  Distance Ambulated (ft) 470 ft  Mobility Response Tolerated well  Mobility performed by Mobility specialist  $Mobility charge 1 Mobility    Pre-mobility: 135 HR, 115/84 BP, 96% SpO2 During mobility: 92 HR Post-mobility: 79 HR, 116/81 BP, 96% SpO2  Pt requesting to attempt to ambulate. RN administered metoprolol prior to ambulation. Min assist to stand from bed and get into bed. Pt back in bed after walk, asx throughout.   Pricilla Handler Mobility Specialist Mobility Specialist Phone: [phone]

## 2020-05-17 NOTE — Progress Notes (Addendum)
[place] CitySuite 411       [employer] [address]             [phone]      5 Days Post-Op Procedure(s) (LRB): BENTALL PROCEDURE (N/A) TRANSESOPHAGEAL ECHOCARDIOGRAM (TEE) (N/A) Subjective: Feels fair  Objective: Vital signs in last 24 hours: Temp:  [97.8 F (36.6 C)-98.8 F (37.1 C)] 98.6 F (37 C) (12/14 0745) Pulse Rate:  [72-135] 84 (12/14 0745) Cardiac Rhythm: Sinus tachycardia (12/14 0302) Resp:  [14-20] 14 (12/14 0745) BP: (104-137)/(76-90) 121/80 (12/14 0745) SpO2:  [94 %-99 %] 98 % (12/14 0745) Weight:  [115.8 kg] 115.8 kg (12/14 0246)  Hemodynamic parameters for last 24 hours:    Intake/Output from previous day: 12/13 0701 - 12/14 0700 In: 1950 [P.O.:1320; Blood:630] Out: 1275 [TMHDQ:2229] Intake/Output this shift: No intake/output data recorded.  General appearance: alert, cooperative and no distress Heart: regular rate and rhythm Lungs: mildly dim in bases, no wheeze Abdomen: benign Extremities: minor edema Wound: incis healing well  Lab Results: Recent Labs    05/16/20 0810 05/17/20 0147  WBC 7.2 6.8  HGB 8.0* 9.4*  HCT 23.1* 26.4*  PLT 150 164   BMET:  Recent Labs    05/16/20 0810 05/17/20 0147  NA 132* 133*  K 3.3* 4.0  CL 94* 95*  CO2 26 27  GLUCOSE 133* 116*  BUN 25* 25*  CREATININE 1.16 1.16  CALCIUM 8.2* 8.6*    PT/INR: No results for input(s): LABPROT, INR in the last 72 hours. ABG    Component Value Date/Time   PHART 7.365 05/12/2020 2037   HCO3 23.1 05/12/2020 2037   TCO2 24 05/12/2020 2037   ACIDBASEDEF 2.0 05/12/2020 2037   O2SAT 92.0 05/12/2020 2037   CBG (last 3)  Recent Labs    05/16/20 1643 05/16/20 2119 05/17/20 0606  GLUCAP 117* 91 106*    Meds Scheduled Meds: . aspirin EC  325 mg Oral Daily   Or  . aspirin  324 mg Per Tube Daily  . bisacodyl  10 mg Oral Daily   Or  . bisacodyl  10 mg Rectal Daily  . Chlorhexidine Gluconate Cloth  6 each Topical Daily  . colchicine  0.3 mg Oral  BID  . docusate sodium  200 mg Oral Daily  . ferrous [id number] C-folic acid  1 capsule Oral BID PC  . furosemide  40 mg Intravenous Daily  . insulin aspart  0-24 Units Subcutaneous TID WC & HS  . mouth rinse  15 mL Mouth Rinse BID  . metoprolol tartrate  25 mg Oral BID   Or  . metoprolol tartrate  25 mg Per Tube BID  . pantoprazole  40 mg Oral Daily  . potassium chloride  40 mEq Oral BID   Continuous Infusions: PRN Meds:.dextrose, levalbuterol, metoprolol tartrate, ondansetron (ZOFRAN) IV, oxyCODONE, traMADol  Xrays DG Chest 2 View  Result Date: 05/16/2020 CLINICAL DATA:  Shortness of breath.  Recent open heart surgery. EXAM: CHEST - 2 VIEW COMPARISON:  One-view chest x-ray 05/15/2020 FINDINGS: Heart is enlarged. Atherosclerotic calcifications present at the aortic arch. Subsegmental atelectasis is again noted on the left. Lung volumes are low. Small pneumothorax is decreasing at the right apex. IMPRESSION: 1. Decreasing small right pneumothorax. 2. Stable left basilar atelectasis. 3. Stable cardiomegaly without failure. Electronically Signed   By: [place] M.D.   On: 05/16/2020 08:21    Assessment/Plan: S/P Procedure(s) (LRB): BENTALL PROCEDURE (N/A) TRANSESOPHAGEAL ECHOCARDIOGRAM (TEE) (N/A)  1 afeb, VSS, sinus tachy, intermit afib with RVR and some PVC's- will add po amio 2 sats good on 1 liter 3 anemia improved after transfusion appropriately, on Iron as well 4 BS controlled 5 normal renal function, weight trending down with good UOP, cont current diuretic 6 no leukocytosis 7 thrombocytopenia resolved 8 T bili slightly elevated - monitor clinically 9 push rehab/pulm toilet as able    LOS: 5 days    John Giovanni PA-C Pager [phone] 05/17/2020  cont with po amiodarone, lopressor 25mg  Hold anti coagulation for now Check labs in am  patient examined and medical record reviewed,agree with above note. Tharon Aquas Trigt III 05/17/2020

## 2020-05-17 NOTE — Progress Notes (Signed)
Patient in bed resting  With no complaints. H.R. S.T. 130's B.P.120/89 . Lopressor 2.5 mg I.V given and heart rate came down to 85 S.R. at 00:34 Reden R.N. aware

## 2020-05-17 NOTE — Progress Notes (Addendum)
CARDIAC REHAB PHASE I   PRE:  Rate/Rhythm: 75 SR    BP: lying 95/71    SaO2: 98 1L, 86 RA  MODE:  Ambulation: 430 ft   POST:  Rate/Rhythm: 87 SR with PVCs    BP: sitting 116/75     SaO2: 93 2L  Pt with many c/o this am however progressing well. Verbal cues to move out of bed. Stood with min assist. Used rollator and 2L O2 as SaO2 decreased on RA before walking. Slow and steady pace in hall, rest x3 leaning up against wall. No afib walking. To recliner, feet elevated, practiced IS (700 mL). Encouraged more use, recliner, x2 more walks. Pt thinks he would like rollator at d/c but can continue to eval. [place], ACSM 05/17/2020 9:40 AM

## 2020-05-18 ENCOUNTER — Other Ambulatory Visit: Payer: Self-pay | Admitting: Cardiology

## 2020-05-18 LAB — CBC
HCT: 26.2 % — ABNORMAL LOW (ref 39.0–52.0)
Hemoglobin: 8.9 g/dL — ABNORMAL LOW (ref 13.0–17.0)
MCH: 32.1 pg (ref 26.0–34.0)
MCHC: 34 g/dL (ref 30.0–36.0)
MCV: 94.6 fL (ref 80.0–100.0)
Platelets: 175 10*3/uL (ref 150–400)
RBC: 2.77 MIL/uL — ABNORMAL LOW (ref 4.22–5.81)
RDW: 14 % (ref 11.5–15.5)
WBC: 6.8 10*3/uL (ref 4.0–10.5)
nRBC: 0.4 % — ABNORMAL HIGH (ref 0.0–0.2)

## 2020-05-18 LAB — BASIC METABOLIC PANEL
Anion gap: 12 (ref 5–15)
BUN: 28 mg/dL — ABNORMAL HIGH (ref 8–23)
CO2: 27 mmol/L (ref 22–32)
Calcium: 8.6 mg/dL — ABNORMAL LOW (ref 8.9–10.3)
Chloride: 95 mmol/L — ABNORMAL LOW (ref 98–111)
Creatinine, Ser: 1.28 mg/dL — ABNORMAL HIGH (ref 0.61–1.24)
GFR, Estimated: >60 mL/min (ref >60)
Glucose, Bld: 106 mg/dL — ABNORMAL HIGH (ref 70–99)
Potassium: 4.1 mmol/L (ref 3.5–5.1)
Sodium: 134 mmol/L — ABNORMAL LOW (ref 135–145)

## 2020-05-18 LAB — GLUCOSE, CAPILLARY
Glucose-Capillary: 94 mg/dL (ref 70–99)
Glucose-Capillary: 98 mg/dL (ref 70–99)
Glucose-Capillary: 108 mg/dL — ABNORMAL HIGH (ref 70–99)
Glucose-Capillary: 113 mg/dL — ABNORMAL HIGH (ref 70–99)

## 2020-05-18 MED ORDER — FUROSEMIDE 40 MG PO TABS
40.0000 mg | ORAL_TABLET | Freq: Every day | ORAL | Status: DC
  Administered 2020-05-18 – 2020-05-19 (×2): 40 mg via ORAL
  Filled 2020-05-18 (×2): qty 1

## 2020-05-18 NOTE — Progress Notes (Signed)
CARDIAC REHAB PHASE I   PRE:  Rate/Rhythm: 84 SR with PVC  BP:  Sitting: 118/75      SaO2: 95 RA  MODE:  Ambulation: 400 ft   POST:  Rate/Rhythm: 89 SR with PVCs  BP:  Sitting: 137/83    SaO2: 95 RA   Pt ambulated 429ft in hallway standby assist with rollator. Pt denies pain, SOB, or dizziness. Pt able to maintain sats on room air throughout session. Demonstrating ~1500 on IS. Pt disappointed, was hopeful for d/c today. Encouraged continued IS use, and walks.  [phone] Rufina Falco, RN BSN 05/18/2020 9:49 AM

## 2020-05-18 NOTE — TOC Initial Note (Signed)
Transition of Care (TOC) - Initial/Assessment Note  Marvetta Gibbons RN, BSN Transitions of Care Unit 4E- RN Case Manager See Treatment Team for direct phone #    Patient Details  Name: Devon Sanchez MRN: [id number] Date of Birth: [birth year]  Transition of Care [facility]) CM/SW Contact:    Dawayne Patricia, RN Phone Number: 05/18/2020, 11:44 AM  Clinical Narrative:                 Pt from home with spouse, s/p Bentall, orders placed for HHPT/OT, spoke with pt and wife at bedside- list provided for [facility] choice Per CMS guidelines from medicare.gov website with star ratings (copy placed in shadow chart). Pt has pre-op referral from [place] office to [facility] for any HH needs post discharge- discussed with pt and wife this referral and right for choice if they wanted to use another agency- wife confirmed she had received letter from [facility]- per pt and wife they are agreeable to using [facility] for [facility] needs/services.  Will contact Sharyn Lull with [facility] and confirm referral and discharge for start of care needs.   Per wife they have DME in the home - only DME pt needs is a rollator- order has been placed- Call made to [employer] for delivery of DME need  To the room prior to discharge- rollator to be delivered.   Address, phone # and PCP all confirmed with pt in epic, wife to transport home when discharged.   Expected Discharge Plan: [place] Barriers to Discharge: Continued Medical Work up   Patient Goals and CMS Choice Patient states their goals for this hospitalization and ongoing recovery are:: return home CMS Medicare.gov Compare Post Acute Care list provided to:: Patient Choice offered to / list presented to : [facility]  Expected Discharge Plan and Services Expected Discharge Plan: [place]   Discharge Planning Services: CM Consult Post Acute Care Choice: Home Health,Durable Medical Equipment Living arrangements for the past 2 months: Single  Family Home                 DME Arranged: Walker rolling with seat DME Agency: [facility] Date DME Agency Contacted: 05/18/20 Time DME Agency Contacted: [phone] Representative spoke with at DME Agency: [place]: PT,OT [place] Agency: [facility] [place] Date [place]: 05/18/20 Time [place]: 58 Representative spoke with at [place]: Sharyn Lull  Prior Living Arrangements/Services Living arrangements for the past 2 months: [place] with:: Spouse Patient language and need for interpreter reviewed:: Yes Do you feel safe going back to the place where you live?: Yes      Need for Family Participation in Patient Care: Yes (Comment) Care giver support system in place?: Yes (comment) Current home services: DME (shower seat, BSC) Criminal Activity/Legal Involvement Pertinent to Current Situation/Hospitalization: No - Comment as needed  Activities of Daily Living Home Assistive Devices/Equipment: Environmental consultant (specify type),CPAP,Eyeglasses ADL Screening (condition at time of admission) Patient's cognitive ability adequate to safely complete daily activities?: Yes Is the patient deaf or have difficulty hearing?: No Does the patient have difficulty seeing, even when wearing glasses/contacts?: No Does the patient have difficulty concentrating, remembering, or making decisions?: No Patient able to express need for assistance with ADLs?: Yes Does the patient have difficulty dressing or bathing?: No Independently performs ADLs?: Yes (appropriate for developmental age) Does the patient have difficulty walking or climbing stairs?: Yes Weakness of Legs: Left Weakness of Arms/Hands: None  Permission Sought/Granted Permission sought  to share information with : Chartered certified accountant granted to share information with : Yes, Verbal Permission Granted     Permission granted to share info w AGENCY: [facility]        Emotional  Assessment Appearance:: Appears stated age Attitude/Demeanor/Rapport: Engaged Affect (typically observed): Appropriate,Pleasant Orientation: : Oriented to Self,Oriented to Place,Oriented to  Time,Oriented to Situation Alcohol / Substance Use: Not Applicable Psych Involvement: No (comment)  Admission diagnosis:  S/P aortic aneurysm repair [H72.902, Z86.79] Patient Active Problem List   Diagnosis Date Noted  . S/P aortic aneurysm repair 05/12/2020  . Idiopathic medial aortopathy and arteriopathy ([place]) 04/20/2020  . Encounter for preoperative assessment for noncoronary cardiac surgery 01/13/2020  . Aortic stenosis with bicuspid valve 12/23/2019  . Bicuspid aortic valve 07/25/2018  . Aortic root dilatation ([place]) 07/25/2018  . Vivid dream 12/06/2016  . OSA on CPAP 11/03/2015  . PTSD (post-traumatic stress disorder) 11/03/2015  . Atherosclerotic heart disease 04/21/2013  . NSTEMI (non-ST elevated myocardial infarction) ([place]) 04/21/2013  . Hyperlipidemia 04/21/2013  . Hyperglycemia 04/21/2013   PCP:  Celene Squibb, MD Pharmacy:   [facility] [place], [place] - [address] [place] [address] Phone: [phone] Fax: [phone]  [place] [place], [address] [place], [place] [address] Phone: [phone] Fax: [place], [place]. [address] [place] [address] Phone: [phone] Fax: [phone]     Social Determinants of Health (SDOH) Interventions    Readmission Risk Interventions Readmission Risk Prevention Plan 05/18/2020  Post Dischage Appt Complete  Medication Screening Complete  Transportation Screening Complete  Some recent data might be hidden

## 2020-05-18 NOTE — Progress Notes (Addendum)
[address]       [employer] [address]             [phone]      6 Days Post-Op Procedure(s) (LRB): BENTALL PROCEDURE (N/A) TRANSESOPHAGEAL ECHOCARDIOGRAM (TEE) (N/A) Subjective: Feels ok , anxious to go home soon  Objective: Vital signs in last 24 hours: Temp:  [98.1 F (36.7 C)-98.9 F (37.2 C)] 98.5 F (36.9 C) (12/15 0306) Pulse Rate:  [70-138] 70 (12/15 0727) Cardiac Rhythm: Normal sinus rhythm (12/15 0300) Resp:  [14-20] 14 (12/15 0727) BP: (96-125)/(67-81) 113/75 (12/15 0727) SpO2:  [92 %-98 %] 97 % (12/15 0727) Weight:  [657 kg] 114 kg (12/15 0405)  Hemodynamic parameters for last 24 hours:    Intake/Output from previous day: 12/14 0701 - 12/15 0700 In: -  Out: 3170 [Urine:3095; Stool:75] Intake/Output this shift: No intake/output data recorded.  General appearance: alert, cooperative and no distress Heart: regular rate and rhythm Lungs: min dim in bases Abdomen: benign Extremities: trace edema Wound: incis healing well  Lab Results: Recent Labs    05/17/20 0147 05/18/20 0052  WBC 6.8 6.8  HGB 9.4* 8.9*  HCT 26.4* 26.2*  PLT 164 175   BMET:  Recent Labs    05/17/20 0147 05/18/20 0052  NA 133* 134*  K 4.0 4.1  CL 95* 95*  CO2 27 27  GLUCOSE 116* 106*  BUN 25* 28*  CREATININE 1.16 1.28*  CALCIUM 8.6* 8.6*    PT/INR: No results for input(s): LABPROT, INR in the last 72 hours. ABG    Component Value Date/Time   PHART 7.365 05/12/2020 2037   HCO3 23.1 05/12/2020 2037   TCO2 24 05/12/2020 2037   ACIDBASEDEF 2.0 05/12/2020 2037   O2SAT 92.0 05/12/2020 2037   CBG (last 3)  Recent Labs    05/17/20 1711 05/17/20 2124 05/18/20 0635  GLUCAP 101* 130* 94    Meds Scheduled Meds: . amiodarone  400 mg Oral BID  . aspirin EC  325 mg Oral Daily   Or  . aspirin  324 mg Per Tube Daily  . bisacodyl  10 mg Oral Daily   Or  . bisacodyl  10 mg Rectal Daily  . Chlorhexidine Gluconate Cloth  6 each Topical Daily   . colchicine  0.3 mg Oral BID  . docusate sodium  200 mg Oral Daily  . ferrous [id number] C-folic acid  1 capsule Oral BID PC  . furosemide  60 mg Intravenous BID  . insulin aspart  0-24 Units Subcutaneous TID WC & HS  . mouth rinse  15 mL Mouth Rinse BID  . metoprolol tartrate  25 mg Oral BID   Or  . metoprolol tartrate  25 mg Per Tube BID  . pantoprazole  40 mg Oral Daily   Continuous Infusions: PRN Meds:.dextrose, levalbuterol, metoprolol tartrate, ondansetron (ZOFRAN) IV, oxyCODONE, traMADol  Xrays No results found.  Assessment/Plan: S/P Procedure(s) (LRB): BENTALL PROCEDURE (N/A) TRANSESOPHAGEAL ECHOCARDIOGRAM (TEE) (N/A)  1 afeb, SR/Afib, tachy at times, SBP 90's-120's, will hold on ACRX for now 2 sats ok on 1 liter 3 diuresing well, weight about preop, creat up a bit , will decrease lasix to 40 q day 4 H/H stable, conts Iron 5 BS controlled 6 cont pulm toilet and rehab 7 hopefully home 1-2 days  LOS: 6 days    John Giovanni PA-C Pager [phone] 05/18/2020  doing much better    Should be ready for DC Thurs am  F/U with Dr Ebbie Ridge in office  patient examined and medical record reviewed,agree with above note. Tharon Aquas Trigt III 05/18/2020

## 2020-05-18 NOTE — Progress Notes (Signed)
Physical Therapy Treatment Patient Details Name: Devon Sanchez MRN: [id number] DOB: 12/18/1950 Today's Date: 05/18/2020    History of Present Illness 69 year old gentleman presents for consultation regarding moderate aortic valve disease and 5.2 cm ascending aortic aneurysm. Experiencing SoB and mild chest pain with exertion. 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    PT Comments    Pt seated in recliner.  He is very eager to return home and discouraged that he has not been able to go home yet.  He is mobilizing well and progressed to stair training this session with good tolerance.  PLan for HHPT at d/c.     Follow Up Recommendations  Home health PT;Supervision/Assistance - 24 hour     Equipment Recommendations  3in1 (PT);Other (comment) (wide rollator-4 wheeled rolling walker with a seat.)    Recommendations for Other Services       Precautions / Restrictions Precautions Precautions: Sternal Precaution Booklet Issued: Yes (comment) Precaution Comments: able to recall most of precautions Restrictions Weight Bearing Restrictions: Yes (sternal precautions.)    Mobility  Bed Mobility               General bed mobility comments: Pt seated in recliner on arrival this session.  Transfers Overall transfer level: Needs assistance Equipment used: 4-wheeled walker Transfers: Sit to/from Stand Sit to Stand: Supervision         General transfer comment: Pt with good recall of hand placement.  Ambulation/Gait Ambulation/Gait assistance: Supervision Gait Distance (Feet): 300 Feet Assistive device: 4-wheeled walker Gait Pattern/deviations: Step-through pattern;Trunk flexed Gait velocity: slowed   General Gait Details: Cues for upper trunk control and pacing.  Standing rest break mid way x1.   Stairs Stairs: Yes Stairs assistance: Supervision Stair Management: One rail Right Number of Stairs: 4 General stair comments: Cues for  sequencing and hand placement on rail for balance.  Pt tolerated trial well.   Wheelchair Mobility    Modified Rankin (Stroke Patients Only)       Balance Overall balance assessment: Needs assistance Sitting-balance support: No upper extremity supported Sitting balance-Leahy Scale: Fair       Standing balance-Leahy Scale: Poor                              Cognition Arousal/Alertness: Awake/alert Behavior During Therapy: WFL for tasks assessed/performed Overall Cognitive Status: Within Functional Limits for tasks assessed                                        Exercises      General Comments        Pertinent Vitals/Pain Pain Assessment: Faces Faces Pain Scale: Hurts a little bit Pain Location: sternum Pain Descriptors / Indicators: Operative site guarding;Grimacing;Sharp;Stabbing Pain Intervention(s): Monitored during session;Repositioned    Home Living                      Prior Function            PT Goals (current goals can now be found in the care plan section) Acute Rehab PT Goals Patient Stated Goal: I hope to go home and feel better Potential to Achieve Goals: Good Progress towards PT goals: Progressing toward goals    Frequency    Min 3X/week      PT Plan Current plan remains appropriate  Co-evaluation              AM-PAC PT "6 Clicks" Mobility   Outcome Measure  Help needed turning from your back to your side while in a flat bed without using bedrails?: A Little Help needed moving from lying on your back to sitting on the side of a flat bed without using bedrails?: A Little Help needed moving to and from a bed to a chair (including a wheelchair)?: A Little Help needed standing up from a chair using your arms (e.g., wheelchair or bedside chair)?: A Little Help needed to walk in hospital room?: A Little Help needed climbing 3-5 steps with a railing? : A Little 6 Click Score: 18    End of  Session   Activity Tolerance: Patient tolerated treatment well Patient left: with call bell/phone within reach (using bathroom with instruction to pull string.) Nurse Communication: Mobility status PT Visit Diagnosis: Unsteadiness on feet (R26.81);Other abnormalities of gait and mobility (R26.89);Muscle weakness (generalized) (M62.81);Pain Pain - part of body:  (sternum)     Time: 9597-4718 PT Time Calculation (min) (ACUTE ONLY): 18 min  Charges:  $Gait Training: 8-22 mins                     Erasmo Leventhal , PTA Acute Rehabilitation Services Pager [phone] Office [phone]     Devon Sanchez 05/18/2020, 12:49 PM

## 2020-05-18 NOTE — Progress Notes (Signed)
Occupational Therapy Treatment Patient Details Name: Devon Sanchez MRN: [id number] DOB: 07-01-1950 Today's Date: 05/18/2020    History of present illness 69 year old gentleman presents for consultation regarding moderate aortic valve disease and 5.2 cm ascending aortic aneurysm. Experiencing SoB and mild chest pain with exertion. 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   OT comments  Patient with nice progression from last session.  He is out of ICU and on the regular floor.  He is up and moving at a Mod I level with the 4WRW.  He is toileting himself and is able to complete his ADL from sit/stand level with up to [place].  OT will continue to follow him in the acute setting.  Devon Sanchez OT is recommended post acute.    Follow Up Recommendations  Home health OT;Supervision - Intermittent    Equipment Recommendations  None recommended by OT    Recommendations for Other Services      Precautions / Restrictions Precautions Precautions: Sternal Precaution Booklet Issued: Yes (comment) Precaution Comments: able to recall most of precautions Restrictions Weight Bearing Restrictions: Yes (sternal precautions.) RUE Weight Bearing: Touch down weight bearing LUE Weight Bearing: Touch down weight bearing       Mobility                General bed mobility comments: Pt seated in recliner on arrival this session.  Transfers Overall transfer level: Needs assistance Equipment used: 4-wheeled walker Transfers: Sit to/from Stand Sit to Stand: Supervision         General transfer comment: Patient requesting to walk.  Able to mobilize in the halls with 4WRW and no VC's for rest breaks.  Patient with good sense of when to rest.    Balance Overall balance assessment: No apparent balance deficits (not formally assessed) Sitting-balance support: No upper extremity supported Sitting balance-Leahy Scale: Fair     Standing balance support: Bilateral upper  extremity supported Standing balance-Leahy Scale: Fair                             ADL either performed or assessed with clinical judgement   ADL                           Toilet Transfer: Modified Independent Toilet Transfer Details (indicate cue type and reason): 1VQM Toileting- Clothing Manipulation and Hygiene: Independent;Sit to/from stand       Functional mobility during ADLs: Modified independent;Rolling walker                          Cognition Arousal/Alertness: Awake/alert Behavior During Therapy: WFL for tasks assessed/performed Overall Cognitive Status: Within Functional Limits for tasks assessed                                                      General Comments  HR stable at 70 BPM and O2 sats 97% on RA    Pertinent Vitals/ Pain       Pain Assessment: Faces Faces Pain Scale: Hurts a little bit Pain Location: sternum Pain Descriptors / Indicators: Aching Pain Intervention(s): Monitored during session  Frequency  Min 2X/week        Progress Toward Goals  OT Goals(current goals can now be found in the care plan section)  Progress towards OT goals: Progressing toward goals  Acute Rehab OT Goals Patient Stated Goal: I know I need to start slowing down and take better care of myself. OT Goal Formulation: With patient Time For Goal Achievement: 05/27/20 Potential to Achieve Goals: Good  Plan Discharge plan remains appropriate    Co-evaluation                 AM-PAC OT "6 Clicks" Daily Activity     Outcome Measure   Help from another person eating meals?: None Help from another person taking care of personal grooming?: None Help from another person toileting, which includes using toliet, bedpan, or urinal?: A Little Help from another person bathing (including washing, rinsing, drying)?: A Little Help from  another person to put on and taking off regular upper body clothing?: A Little Help from another person to put on and taking off regular lower body clothing?: A Little 6 Click Score: 20    End of Session Equipment Utilized During Treatment: Rolling walker  OT Visit Diagnosis: Unsteadiness on feet (R26.81);Muscle weakness (generalized) (M62.81);Pain   Activity Tolerance Patient tolerated treatment well   Patient Left in chair;with call bell/phone within reach;with family/visitor present   Nurse Communication          Time: 5189-8421 OT Time Calculation (min): 19 min  Charges: OT General Charges $OT Visit: 1 Visit OT Treatments $Self Care/Home Management : 8-22 mins  05/18/2020  Rich, OTR/L  Acute Rehabilitation Services  Office:  [phone]    Metta Clines 05/18/2020, 2:39 PM

## 2020-05-19 LAB — BASIC METABOLIC PANEL
Anion gap: 8 (ref 5–15)
BUN: 23 mg/dL (ref 8–23)
CO2: 29 mmol/L (ref 22–32)
Calcium: 8.5 mg/dL — ABNORMAL LOW (ref 8.9–10.3)
Chloride: 98 mmol/L (ref 98–111)
Creatinine, Ser: 1.22 mg/dL (ref 0.61–1.24)
GFR, Estimated: >60 mL/min (ref >60)
Glucose, Bld: 101 mg/dL — ABNORMAL HIGH (ref 70–99)
Potassium: 4.2 mmol/L (ref 3.5–5.1)
Sodium: 135 mmol/L (ref 135–145)

## 2020-05-19 LAB — GLUCOSE, CAPILLARY: Glucose-Capillary: 119 mg/dL — ABNORMAL HIGH (ref 70–99)

## 2020-05-19 MED ORDER — FE FUMARATE-B12-VIT C-FA-IFC PO CAPS: 1.0000 | ORAL_CAPSULE | Freq: Two times a day (BID) | ORAL | 2 refills | Status: DC

## 2020-05-19 MED ORDER — AMIODARONE HCL 200 MG PO TABS: 200.0000 mg | ORAL_TABLET | Freq: Two times a day (BID) | ORAL | 1 refills | Status: DC

## 2020-05-19 MED ORDER — METOPROLOL TARTRATE 25 MG PO TABS: 25.0000 mg | ORAL_TABLET | Freq: Two times a day (BID) | ORAL | 1 refills | Status: DC

## 2020-05-19 MED ORDER — ASPIRIN 325 MG PO TBEC: 325.0000 mg | DELAYED_RELEASE_TABLET | Freq: Every day | ORAL | Status: AC

## 2020-05-19 MED ORDER — ROSUVASTATIN CALCIUM 20 MG PO TABS: 20.0000 mg | ORAL_TABLET | Freq: Every day | ORAL | Status: DC

## 2020-05-19 MED ORDER — TRAMADOL HCL 50 MG PO TABS: 50.0000 mg | ORAL_TABLET | Freq: Four times a day (QID) | ORAL | 0 refills | Status: DC | PRN

## 2020-05-19 NOTE — TOC Transition Note (Signed)
Transition of Care (TOC) - CM/SW Discharge Note Marvetta Gibbons RN, BSN Transitions of Care Unit 4E- RN Case Manager See Treatment Team for direct phone #    Patient Details  Name: RAMERE DOWNS MRN: [id number] Date of Birth: 25-Feb-1951  Transition of Care [facility]) CM/SW Contact:  Dawayne Patricia, RN Phone Number: 05/19/2020, 12:12 PM   Clinical Narrative:    Pt stable for transition home today, [employer] has been called for DME need- rollator to be delivered to room prior to discharge, Notified [facility] of discharge for start of care needs and confirmation of TCTS referral.  Family to transport home. No further TOC needs noted   Final next level of care: [place] Barriers to Discharge: Barriers Resolved   Patient Goals and CMS Choice Patient states their goals for this hospitalization and ongoing recovery are:: return home CMS Medicare.gov Compare Post Acute Care list provided to:: Patient Choice offered to / list presented to : Patient,Spouse  Discharge Placement                 Home with [facility]      Discharge Plan and Services   Discharge Planning Services: CM Consult Post Acute Care Choice: Home Health,Durable Medical Equipment          DME Arranged: Walker rolling with seat DME Agency: [facility] Date DME Agency Contacted: 05/18/20 Time DME Agency Contacted: [phone] Representative spoke with at DME Agency: [place]: PT,OT [place] Agency: [facility] [place] Date [place]: 05/18/20 Time [place]: 1143 Representative spoke with at [place]: [place] ([place]) Interventions     Readmission Risk Interventions Readmission Risk Prevention Plan 05/18/2020  Post Dischage Appt Complete  Medication Screening Complete  Transportation Screening Complete  Some recent data might be hidden

## 2020-05-19 NOTE — Progress Notes (Signed)
Pt provided discharge instructions and education. Pt IV removed and intact. Telebox removed. Vitals stable. Pt denies complaints. Wife at bedside ha all belongings. Pt tx via wheelchair to valet to meet ride.  Jerald Kief, RN

## 2020-05-19 NOTE — Progress Notes (Signed)
CARDIAC REHAB PHASE I   D/c education completed with pt and wife. Pt educated on importance of site care and monitoring incision daily. Encouraged continued IS use, walks, and sterna precautions. Pt given in-the-tube sheet along with heart healthy diet. Reviewed restrictions and exercise guidelines. Will refer to CRP II [place].   [id number] Rufina Falco, RN BSN 05/19/2020 10:08 AM

## 2020-05-19 NOTE — Progress Notes (Signed)
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 Experiencing SoB and mild chest pain with exertion. 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    PT Comments    Pt supine in bed on arrival.  Assisted patient to edge of bed and assisted in lower and upper body dressing while maintaining sternal precautions.  Performed increased gt and retrial of stairs.  Pt ready to d/c home post session.     Follow Up Recommendations  Home health PT;Supervision/Assistance - 24 hour     Equipment Recommendations  3in1 (PT);Other (comment) (rollator)    Recommendations for Other Services       Precautions / Restrictions Precautions Precautions: Sternal Precaution Booklet Issued: Yes (comment) Precaution Comments: able to recall most of precautions Restrictions Weight Bearing Restrictions: Yes RUE Weight Bearing: Touch down weight bearing LUE Weight Bearing: Touch down weight bearing Other Position/Activity Restrictions: sternal precautions    Mobility  Bed Mobility Overal bed mobility: Needs Assistance Bed Mobility: Supine to Sit Rolling: Min assist         General bed mobility comments: Min assistance to elevate trunk into a seated position and move with arms in "the tube."  Transfers Overall transfer level: Needs assistance Equipment used: 4-wheeled walker Transfers: Sit to/from Stand Sit to Stand: Supervision         General transfer comment: Pt with good recall to lock brakes.  Cues for hand placement on B knees for safety to maintain sternal precautions.  Ambulation/Gait Ambulation/Gait assistance: Supervision Gait Distance (Feet): 400 Feet Assistive device: 4-wheeled walker Gait Pattern/deviations: Step-through  pattern;Trunk flexed     General Gait Details: Cues for upper trunk control and pacing.  Standing rest break mid way x1 to adjust height of personal rollator to improve fit.   Stairs Stairs: Yes Stairs assistance: Supervision Stair Management: One rail Right;Forwards Number of Stairs: 4 General stair comments: Cues for sequencing and hand placement on rail for balance.  Pt tolerated trial well.   Wheelchair Mobility    Modified Rankin (Stroke Patients Only)       Balance Overall balance assessment: No apparent balance deficits (not formally assessed) Sitting-balance support: No upper extremity supported Sitting balance-Leahy Scale: Fair       Standing balance-Leahy Scale: Fair                              Cognition Arousal/Alertness: Awake/alert Behavior During Therapy: WFL for tasks assessed/performed Overall Cognitive Status: Within Functional Limits for tasks assessed                                        Exercises      General Comments        Pertinent Vitals/Pain Pain Assessment: Faces Faces Pain Scale: Hurts a little bit Pain Location: sternum Pain Descriptors / Indicators: Aching Pain Intervention(s): Monitored during session    Home Living                      Prior Function            PT Goals (current goals can now be found in the care plan section) Acute  Rehab PT Goals Patient Stated Goal: To go home. Potential to Achieve Goals: Good Progress towards PT goals: Progressing toward goals    Frequency    Min 3X/week      PT Plan Current plan remains appropriate    Co-evaluation              AM-PAC PT "6 Clicks" Mobility   Outcome Measure  Help needed turning from your back to your side while in a flat bed without using bedrails?: A Little Help needed moving from lying on your back to sitting on the side of a flat bed without using bedrails?: A Little Help needed moving to and from a bed  to a chair (including a wheelchair)?: A Little Help needed standing up from a chair using your arms (e.g., wheelchair or bedside chair)?: A Little Help needed to walk in hospital room?: A Little Help needed climbing 3-5 steps with a railing? : A Little 6 Click Score: 18    End of Session   Activity Tolerance: Patient tolerated treatment well Patient left: in chair;with call bell/phone within reach Nurse Communication: Mobility status (informed RN he was ready for IV to be removed.) PT Visit Diagnosis: Unsteadiness on feet (R26.81);Other abnormalities of gait and mobility (R26.89);Muscle weakness (generalized) (M62.81);Pain Pain - part of body:  (sternum)     Time: 1027-1050 PT Time Calculation (min) (ACUTE ONLY): 23 min  Charges:  $Gait Training: 8-22 mins $Therapeutic Activity: 8-22 mins                     Erasmo Leventhal , PTA Acute Rehabilitation Services Pager [phone] Office [phone]     Devon Sanchez 05/19/2020, 3:31 PM

## 2020-05-19 NOTE — Progress Notes (Addendum)
      [address]       [place] [address]             [phone]      7 Days Post-Op Procedure(s) (LRB): BENTALL PROCEDURE (N/A) TRANSESOPHAGEAL ECHOCARDIOGRAM (TEE) (N/A) Subjective: Feels much better today  Objective: Vital signs in last 24 hours: Temp:  [97.7 F (36.5 C)-98.4 F (36.9 C)] 98.4 F (36.9 C) (12/16 0741) Pulse Rate:  [67-74] 70 (12/16 0741) Cardiac Rhythm: Normal sinus rhythm (12/15 2011) Resp:  [17-21] 20 (12/16 0741) BP: (99-122)/(62-73) 102/73 (12/16 0741) SpO2:  [95 %] 95 % (12/16 0741) Weight:  [112.6 kg] 112.6 kg (12/16 0500)  Hemodynamic parameters for last 24 hours:    Intake/Output from previous day: 12/15 0701 - 12/16 0700 In: 480 [P.O.:480] Out: 400 [Urine:400] Intake/Output this shift: No intake/output data recorded.  General appearance: alert, cooperative and no distress Heart: regular rate and rhythm Lungs: clear to auscultation bilaterally Abdomen: benign Extremities: no edema Wound: incis healing well  Lab Results: Recent Labs    05/17/20 0147 05/18/20 0052  WBC 6.8 6.8  HGB 9.4* 8.9*  HCT 26.4* 26.2*  PLT 164 175   BMET:  Recent Labs    05/18/20 0052 05/19/20 0112  NA 134* 135  K 4.1 4.2  CL 95* 98  CO2 27 29  GLUCOSE 106* 101*  BUN 28* 23  CREATININE 1.28* 1.22  CALCIUM 8.6* 8.5*    PT/INR: No results for input(s): LABPROT, INR in the last 72 hours. ABG    Component Value Date/Time   PHART 7.365 05/12/2020 2037   HCO3 23.1 05/12/2020 2037   TCO2 24 05/12/2020 2037   ACIDBASEDEF 2.0 05/12/2020 2037   O2SAT 92.0 05/12/2020 2037   CBG (last 3)  Recent Labs    05/18/20 1554 05/18/20 2106 05/19/20 0552  GLUCAP 108* 113* 119*    Meds Scheduled Meds: . amiodarone  400 mg Oral BID  . aspirin EC  325 mg Oral Daily   Or  . aspirin  324 mg Per Tube Daily  . bisacodyl  10 mg Oral Daily   Or  . bisacodyl  10 mg Rectal Daily  . Chlorhexidine Gluconate Cloth  6 each Topical Daily   . colchicine  0.3 mg Oral BID  . docusate sodium  200 mg Oral Daily  . ferrous [id number] C-folic acid  1 capsule Oral BID PC  . furosemide  40 mg Oral Daily  . insulin aspart  0-24 Units Subcutaneous TID WC & HS  . mouth rinse  15 mL Mouth Rinse BID  . metoprolol tartrate  25 mg Oral BID   Or  . metoprolol tartrate  25 mg Per Tube BID  . pantoprazole  40 mg Oral Daily   Continuous Infusions: PRN Meds:.dextrose, levalbuterol, metoprolol tartrate, ondansetron (ZOFRAN) IV, oxyCODONE, traMADol  Xrays No results found.  Assessment/Plan: S/P Procedure(s) (LRB): BENTALL PROCEDURE (N/A) TRANSESOPHAGEAL ECHOCARDIOGRAM (TEE) (N/A)  1 afeb, VSS, sinus with PVC's 2 sats ok on RA 3 creat improved 4 K+ normal 5 BS well controlled 6 good diuresis , weight conts to trend down, will stop diuretics 7 stable for discharge  LOS: 7 days    John Giovanni PA-C Pager [phone] patient examined and medical record reviewed,agree with above note.Will not need coumadin. Dc instructions reviewed with patient Tharon Aquas Trigt III 05/19/2020

## 2020-05-20 ENCOUNTER — Other Ambulatory Visit: Payer: Self-pay | Admitting: Cardiothoracic Surgery

## 2020-05-20 DIAGNOSIS — Z48812 Encounter for surgical aftercare following surgery on the circulatory system: Secondary | ICD-10-CM | POA: Diagnosis not present

## 2020-05-20 DIAGNOSIS — M6281 Muscle weakness (generalized): Secondary | ICD-10-CM | POA: Diagnosis not present

## 2020-05-20 DIAGNOSIS — M314 Aortic arch syndrome [Takayasu]: Secondary | ICD-10-CM | POA: Diagnosis not present

## 2020-05-20 DIAGNOSIS — Z955 Presence of coronary angioplasty implant and graft: Secondary | ICD-10-CM | POA: Diagnosis not present

## 2020-05-20 DIAGNOSIS — I10 Essential (primary) hypertension: Secondary | ICD-10-CM | POA: Diagnosis not present

## 2020-05-20 DIAGNOSIS — I2510 Atherosclerotic heart disease of native coronary artery without angina pectoris: Secondary | ICD-10-CM | POA: Diagnosis not present

## 2020-05-20 DIAGNOSIS — Z8679 Personal history of other diseases of the circulatory system: Secondary | ICD-10-CM

## 2020-05-20 DIAGNOSIS — I252 Old myocardial infarction: Secondary | ICD-10-CM | POA: Diagnosis not present

## 2020-05-20 DIAGNOSIS — Z952 Presence of prosthetic heart valve: Secondary | ICD-10-CM | POA: Diagnosis not present

## 2020-05-22 DIAGNOSIS — Z955 Presence of coronary angioplasty implant and graft: Secondary | ICD-10-CM | POA: Diagnosis not present

## 2020-05-22 DIAGNOSIS — Z952 Presence of prosthetic heart valve: Secondary | ICD-10-CM | POA: Diagnosis not present

## 2020-05-22 DIAGNOSIS — M6281 Muscle weakness (generalized): Secondary | ICD-10-CM | POA: Diagnosis not present

## 2020-05-22 DIAGNOSIS — Z48812 Encounter for surgical aftercare following surgery on the circulatory system: Secondary | ICD-10-CM | POA: Diagnosis not present

## 2020-05-22 DIAGNOSIS — M314 Aortic arch syndrome [Takayasu]: Secondary | ICD-10-CM | POA: Diagnosis not present

## 2020-05-22 DIAGNOSIS — I252 Old myocardial infarction: Secondary | ICD-10-CM | POA: Diagnosis not present

## 2020-05-22 DIAGNOSIS — I10 Essential (primary) hypertension: Secondary | ICD-10-CM | POA: Diagnosis not present

## 2020-05-22 DIAGNOSIS — I2510 Atherosclerotic heart disease of native coronary artery without angina pectoris: Secondary | ICD-10-CM | POA: Diagnosis not present

## 2020-05-23 DIAGNOSIS — Z955 Presence of coronary angioplasty implant and graft: Secondary | ICD-10-CM | POA: Diagnosis not present

## 2020-05-23 DIAGNOSIS — M6281 Muscle weakness (generalized): Secondary | ICD-10-CM | POA: Diagnosis not present

## 2020-05-23 DIAGNOSIS — I10 Essential (primary) hypertension: Secondary | ICD-10-CM | POA: Diagnosis not present

## 2020-05-23 DIAGNOSIS — I2510 Atherosclerotic heart disease of native coronary artery without angina pectoris: Secondary | ICD-10-CM | POA: Diagnosis not present

## 2020-05-23 DIAGNOSIS — Z48812 Encounter for surgical aftercare following surgery on the circulatory system: Secondary | ICD-10-CM | POA: Diagnosis not present

## 2020-05-23 DIAGNOSIS — Z952 Presence of prosthetic heart valve: Secondary | ICD-10-CM | POA: Diagnosis not present

## 2020-05-23 DIAGNOSIS — M314 Aortic arch syndrome [Takayasu]: Secondary | ICD-10-CM | POA: Diagnosis not present

## 2020-05-23 DIAGNOSIS — I252 Old myocardial infarction: Secondary | ICD-10-CM | POA: Diagnosis not present

## 2020-05-24 DIAGNOSIS — I252 Old myocardial infarction: Secondary | ICD-10-CM | POA: Diagnosis not present

## 2020-05-24 DIAGNOSIS — I10 Essential (primary) hypertension: Secondary | ICD-10-CM | POA: Diagnosis not present

## 2020-05-24 DIAGNOSIS — M6281 Muscle weakness (generalized): Secondary | ICD-10-CM | POA: Diagnosis not present

## 2020-05-24 DIAGNOSIS — Z952 Presence of prosthetic heart valve: Secondary | ICD-10-CM | POA: Diagnosis not present

## 2020-05-24 DIAGNOSIS — Z48812 Encounter for surgical aftercare following surgery on the circulatory system: Secondary | ICD-10-CM | POA: Diagnosis not present

## 2020-05-24 DIAGNOSIS — M314 Aortic arch syndrome [Takayasu]: Secondary | ICD-10-CM | POA: Diagnosis not present

## 2020-05-24 DIAGNOSIS — Z955 Presence of coronary angioplasty implant and graft: Secondary | ICD-10-CM | POA: Diagnosis not present

## 2020-05-24 DIAGNOSIS — I2510 Atherosclerotic heart disease of native coronary artery without angina pectoris: Secondary | ICD-10-CM | POA: Diagnosis not present

## 2020-05-25 DIAGNOSIS — I252 Old myocardial infarction: Secondary | ICD-10-CM | POA: Diagnosis not present

## 2020-05-25 DIAGNOSIS — Z48812 Encounter for surgical aftercare following surgery on the circulatory system: Secondary | ICD-10-CM | POA: Diagnosis not present

## 2020-05-25 DIAGNOSIS — M6281 Muscle weakness (generalized): Secondary | ICD-10-CM | POA: Diagnosis not present

## 2020-05-25 DIAGNOSIS — Z955 Presence of coronary angioplasty implant and graft: Secondary | ICD-10-CM | POA: Diagnosis not present

## 2020-05-25 DIAGNOSIS — I2510 Atherosclerotic heart disease of native coronary artery without angina pectoris: Secondary | ICD-10-CM | POA: Diagnosis not present

## 2020-05-25 DIAGNOSIS — Z952 Presence of prosthetic heart valve: Secondary | ICD-10-CM | POA: Diagnosis not present

## 2020-05-25 DIAGNOSIS — M314 Aortic arch syndrome [Takayasu]: Secondary | ICD-10-CM | POA: Diagnosis not present

## 2020-05-25 DIAGNOSIS — I10 Essential (primary) hypertension: Secondary | ICD-10-CM | POA: Diagnosis not present

## 2020-05-26 DIAGNOSIS — I10 Essential (primary) hypertension: Secondary | ICD-10-CM | POA: Diagnosis not present

## 2020-05-26 DIAGNOSIS — Z955 Presence of coronary angioplasty implant and graft: Secondary | ICD-10-CM | POA: Diagnosis not present

## 2020-05-26 DIAGNOSIS — I2510 Atherosclerotic heart disease of native coronary artery without angina pectoris: Secondary | ICD-10-CM | POA: Diagnosis not present

## 2020-05-26 DIAGNOSIS — M6281 Muscle weakness (generalized): Secondary | ICD-10-CM | POA: Diagnosis not present

## 2020-05-26 DIAGNOSIS — M314 Aortic arch syndrome [Takayasu]: Secondary | ICD-10-CM | POA: Diagnosis not present

## 2020-05-26 DIAGNOSIS — Z952 Presence of prosthetic heart valve: Secondary | ICD-10-CM | POA: Diagnosis not present

## 2020-05-26 DIAGNOSIS — Z48812 Encounter for surgical aftercare following surgery on the circulatory system: Secondary | ICD-10-CM | POA: Diagnosis not present

## 2020-05-26 DIAGNOSIS — I252 Old myocardial infarction: Secondary | ICD-10-CM | POA: Diagnosis not present

## 2020-05-30 ENCOUNTER — Other Ambulatory Visit: Payer: Self-pay

## 2020-05-30 ENCOUNTER — Ambulatory Visit (INDEPENDENT_AMBULATORY_CARE_PROVIDER_SITE_OTHER): Payer: Self-pay | Admitting: Cardiothoracic Surgery

## 2020-05-30 ENCOUNTER — Ambulatory Visit
Admission: RE | Admit: 2020-05-30 | Discharge: 2020-05-30 | Disposition: A | Discharge status: Home / Self Care | Payer: Medicare Other | Source: Ambulatory Visit | Attending: Cardiothoracic Surgery | Admitting: Cardiothoracic Surgery

## 2020-05-30 VITALS — BP 106/74 | HR 64 | Resp 20 | Ht 74.0 in | Wt 244.8 lb

## 2020-05-30 DIAGNOSIS — J939 Pneumothorax, unspecified: Secondary | ICD-10-CM | POA: Diagnosis not present

## 2020-05-30 DIAGNOSIS — J9811 Atelectasis: Secondary | ICD-10-CM | POA: Diagnosis not present

## 2020-05-30 DIAGNOSIS — J90 Pleural effusion, not elsewhere classified: Secondary | ICD-10-CM | POA: Diagnosis not present

## 2020-05-30 DIAGNOSIS — Z8679 Personal history of other diseases of the circulatory system: Secondary | ICD-10-CM

## 2020-05-30 DIAGNOSIS — Z9889 Other specified postprocedural states: Secondary | ICD-10-CM

## 2020-05-30 MED ORDER — ESCITALOPRAM OXALATE 10 MG PO TABS: 10.0000 mg | ORAL_TABLET | Freq: Every day | ORAL | 2 refills | Status: DC

## 2020-05-31 DIAGNOSIS — Z955 Presence of coronary angioplasty implant and graft: Secondary | ICD-10-CM | POA: Diagnosis not present

## 2020-05-31 DIAGNOSIS — I10 Essential (primary) hypertension: Secondary | ICD-10-CM | POA: Diagnosis not present

## 2020-05-31 DIAGNOSIS — M314 Aortic arch syndrome [Takayasu]: Secondary | ICD-10-CM | POA: Diagnosis not present

## 2020-05-31 DIAGNOSIS — I252 Old myocardial infarction: Secondary | ICD-10-CM | POA: Diagnosis not present

## 2020-05-31 DIAGNOSIS — Z48812 Encounter for surgical aftercare following surgery on the circulatory system: Secondary | ICD-10-CM | POA: Diagnosis not present

## 2020-05-31 DIAGNOSIS — Z952 Presence of prosthetic heart valve: Secondary | ICD-10-CM | POA: Diagnosis not present

## 2020-05-31 DIAGNOSIS — M6281 Muscle weakness (generalized): Secondary | ICD-10-CM | POA: Diagnosis not present

## 2020-05-31 DIAGNOSIS — I2510 Atherosclerotic heart disease of native coronary artery without angina pectoris: Secondary | ICD-10-CM | POA: Diagnosis not present

## 2020-06-01 DIAGNOSIS — Z952 Presence of prosthetic heart valve: Secondary | ICD-10-CM | POA: Diagnosis not present

## 2020-06-01 DIAGNOSIS — I252 Old myocardial infarction: Secondary | ICD-10-CM | POA: Diagnosis not present

## 2020-06-01 DIAGNOSIS — M314 Aortic arch syndrome [Takayasu]: Secondary | ICD-10-CM | POA: Diagnosis not present

## 2020-06-01 DIAGNOSIS — Z48812 Encounter for surgical aftercare following surgery on the circulatory system: Secondary | ICD-10-CM | POA: Diagnosis not present

## 2020-06-01 DIAGNOSIS — Z955 Presence of coronary angioplasty implant and graft: Secondary | ICD-10-CM | POA: Diagnosis not present

## 2020-06-01 DIAGNOSIS — M6281 Muscle weakness (generalized): Secondary | ICD-10-CM | POA: Diagnosis not present

## 2020-06-01 DIAGNOSIS — I2510 Atherosclerotic heart disease of native coronary artery without angina pectoris: Secondary | ICD-10-CM | POA: Diagnosis not present

## 2020-06-01 DIAGNOSIS — I10 Essential (primary) hypertension: Secondary | ICD-10-CM | POA: Diagnosis not present

## 2020-06-02 DIAGNOSIS — Z48812 Encounter for surgical aftercare following surgery on the circulatory system: Secondary | ICD-10-CM | POA: Diagnosis not present

## 2020-06-02 DIAGNOSIS — M314 Aortic arch syndrome [Takayasu]: Secondary | ICD-10-CM | POA: Diagnosis not present

## 2020-06-02 DIAGNOSIS — I10 Essential (primary) hypertension: Secondary | ICD-10-CM | POA: Diagnosis not present

## 2020-06-02 DIAGNOSIS — I2510 Atherosclerotic heart disease of native coronary artery without angina pectoris: Secondary | ICD-10-CM | POA: Diagnosis not present

## 2020-06-02 DIAGNOSIS — Z955 Presence of coronary angioplasty implant and graft: Secondary | ICD-10-CM | POA: Diagnosis not present

## 2020-06-02 DIAGNOSIS — M6281 Muscle weakness (generalized): Secondary | ICD-10-CM | POA: Diagnosis not present

## 2020-06-02 DIAGNOSIS — I252 Old myocardial infarction: Secondary | ICD-10-CM | POA: Diagnosis not present

## 2020-06-02 DIAGNOSIS — Z952 Presence of prosthetic heart valve: Secondary | ICD-10-CM | POA: Diagnosis not present

## 2020-06-14 ENCOUNTER — Telehealth: Payer: Self-pay

## 2020-06-14 NOTE — Telephone Encounter (Signed)
Pt called to check up on MyChart message regarding his coreg. He wants to know if he should still be taking it. He also has an art class starting on the 19th, it is every Wednesday for 3 hours. His PCP said he should be okay to take the class as long as there is no heavy lifting, but he wanted to ask you as well.

## 2020-06-14 NOTE — Telephone Encounter (Signed)
I responded to him. Please set up an OV to see me or Celeste in 2 weeks

## 2020-06-15 NOTE — Telephone Encounter (Signed)
Pts appointment is getting set up now with Celeste. Thank you. AD

## 2020-06-22 NOTE — Progress Notes (Signed)
[address]       [place] [address]             [phone]     CARDIOTHORACIC SURGERY OFFICE NOTE  Referring Provider is Adrian Prows, MD Primary Cardiologist is No primary care provider on file. 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  He did well after surgery and was discharged several days later.  He now returns stating that he is feeling well.  He denies shortness of breath or chest pain.  He denies fevers or chills   Current Outpatient Medications  Medication Sig Dispense Refill  . acetaminophen (TYLENOL) 500 MG tablet Take 500 mg by mouth every 6 (six) hours as needed for moderate pain or headache.     Marland Kitchen amiodarone (PACERONE) 200 MG tablet Take 1 tablet (200 mg total) by mouth 2 (two) times daily. 60 tablet 1  . aspirin EC 325 MG EC tablet Take 1 tablet (325 mg total) by mouth daily.    . carvedilol (COREG) 6.25 MG tablet TAKE 1 TABLET BY MOUTH  TWICE DAILY WITH A MEAL 180 tablet 3  . escitalopram (LEXAPRO) 10 MG tablet Take 1 tablet (10 mg total) by mouth daily. 30 tablet 2  . ferrous [id number] C-folic acid (TRINSICON / FOLTRIN) capsule Take 1 capsule by mouth 2 (two) times daily after a meal. 60 capsule 2  . metoprolol tartrate (LOPRESSOR) 25 MG tablet Take 1 tablet (25 mg total) by mouth 2 (two) times daily. 60 tablet 1  . Multiple Vitamins-Minerals (CENTRUM SILVER PO) Take 1 tablet by mouth every evening.     . pantoprazole (PROTONIX) 40 MG tablet Take 40 mg by mouth daily.    . polyethylene glycol (MIRALAX / GLYCOLAX) 17 g packet Take 17 g by mouth daily.    . rosuvastatin (CRESTOR) 20 MG tablet Take 1 tablet (20 mg total) by mouth daily.    . traMADol (ULTRAM) 50 MG tablet Take 1 tablet (50 mg total) by mouth every 6 (six) hours as needed for moderate pain. 28 tablet 0   No current  facility-administered medications for this visit.      Physical Exam:   BP 106/74 (BP Location: Left Arm, Patient Position: Sitting)   Pulse 64   Resp 20   Ht 6\' 2"  (1.88 m)   Wt 111 kg   SpO2 95% Comment: RA with mask on  BMI 31.43 kg/m   General:  Well-appearing no acute distress  Chest:   Clear to auscultation bilaterally  CV:   Regular rate and rhythm  Incisions:  Healing well  Abdomen:  Soft nontender  Extremities:  Minimal edema  Diagnostic Tests: Chest x-ray from 05/30/2020 showing clear lung fields and stable mediastinum    Impression:  Doing well after aortic valve replacement and replacement of a sending aorta  Plan:   Follow-up as needed with thoracic surgery  I spent in excess of 20 minutes during the conduct of this office consultation and >50% of this time involved direct face-to-face encounter with the patient for counseling and/or coordination of their care.  Level 2                 10 minutes Level 3                 15 minutes Level  4                 25 minutes Level 5                 40 minutes  B.  Murvin Natal, MD 06/22/2020 1:49 PM

## 2020-06-28 ENCOUNTER — Other Ambulatory Visit: Payer: Self-pay

## 2020-06-28 ENCOUNTER — Telehealth: Payer: Self-pay

## 2020-06-28 ENCOUNTER — Ambulatory Visit: Payer: Medicare Other

## 2020-06-28 DIAGNOSIS — R0989 Other specified symptoms and signs involving the circulatory and respiratory systems: Secondary | ICD-10-CM

## 2020-06-28 DIAGNOSIS — I6522 Occlusion and stenosis of left carotid artery: Secondary | ICD-10-CM

## 2020-06-28 NOTE — Telephone Encounter (Signed)
I have tried to get him to see [place] post surgery. I need to reconcile his issues. He should be on coreg only. Please schedule him to see one of [place] sooner than 07/21/20

## 2020-06-28 NOTE — Telephone Encounter (Signed)
Tomi from pcp office called to clarify if pt is supposed to be taking both metoprolol and coreg which he is doing currently. Please review and advise.//ah

## 2020-06-29 NOTE — Telephone Encounter (Signed)
Pt aware, appt moved up to tomorrow 06/30/20//ah

## 2020-06-29 NOTE — Telephone Encounter (Signed)
LMOM to return call//ah

## 2020-06-30 ENCOUNTER — Other Ambulatory Visit: Payer: Self-pay

## 2020-06-30 ENCOUNTER — Encounter: Payer: Self-pay | Admitting: Cardiology

## 2020-06-30 ENCOUNTER — Ambulatory Visit: Payer: Medicare Other | Admitting: Cardiology

## 2020-06-30 VITALS — BP 111/89 | HR 89 | Ht 74.0 in | Wt 232.0 lb

## 2020-06-30 DIAGNOSIS — Q231 Congenital insufficiency of aortic valve: Secondary | ICD-10-CM

## 2020-06-30 DIAGNOSIS — I2510 Atherosclerotic heart disease of native coronary artery without angina pectoris: Secondary | ICD-10-CM | POA: Diagnosis not present

## 2020-06-30 DIAGNOSIS — I6522 Occlusion and stenosis of left carotid artery: Secondary | ICD-10-CM | POA: Diagnosis not present

## 2020-06-30 DIAGNOSIS — Z952 Presence of prosthetic heart valve: Secondary | ICD-10-CM | POA: Diagnosis not present

## 2020-06-30 DIAGNOSIS — I712 Thoracic aortic aneurysm, without rupture, unspecified: Secondary | ICD-10-CM

## 2020-06-30 DIAGNOSIS — Z298 Encounter for other specified prophylactic measures: Secondary | ICD-10-CM

## 2020-06-30 MED ORDER — NITROGLYCERIN 0.4 MG SL SUBL: 0.4000 mg | SUBLINGUAL_TABLET | SUBLINGUAL | 3 refills | Status: AC | PRN

## 2020-06-30 NOTE — Progress Notes (Signed)
Primary Physician/Referring:  Benita Stabile, MD  Patient ID: Devon Sanchez, male    DOB: November 15, 1950, 70 y.o.   MRN: [id number]  Chief Complaint  Patient presents with  . Follow-up  . Bicuspid aortic valve repair  . Hospitalization Follow-up   HPI:    Devon Sanchez  is a 70 y.o. Caucasian male  with CAD S/P Prox LAD stent in 2007 and again  on 04/20/2013 underwent stenting to the proximal and mid LAD and proximal and mid circumflex. 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  States that he has recuperated well since surgery but states that it was "rough".  He has resumed his activity and also started driving.  No specific complaints today.  Past Medical History:  Diagnosis Date  . Anxiety   . Aortic root dilatation (HCC)   . Atherosclerosis   . Bicuspid aortic valve 07/25/2018  . Coronary artery disease   . Depression   . Dizziness and giddiness   . Erectile dysfunction   . Hypercholesterolemia   . Hyperglycemia   . Hyperlipidemia   . Hypertension   . MI (myocardial infarction) (HCC)   . Midsystolic murmur    bicuspid AV with moderate AS (08/2019)  . Reflux   . Sleep apnea   . Thoracic ascending aortic aneurysm [facility])    Past Surgical History:  Procedure Laterality Date  . BENTALL PROCEDURE N/A 05/12/2020   Procedure: BENTALL PROCEDURE;  Surgeon: Linden Dolin, MD;  Location: [facility] OR;  Service: Open Heart Surgery;  Laterality: N/A;  . COLONOSCOPY N/A 11/04/2014   Procedure: COLONOSCOPY;  Surgeon: Malissa Hippo, MD;  Location: [facility] SUITE;  Service: Endoscopy;  Laterality: N/A;  830 - moved to 6/2 @ 10:30  . CORONARY ANGIOPLASTY WITH STENT PLACEMENT    . LEFT HEART  CATHETERIZATION WITH CORONARY ANGIOGRAM N/A 04/20/2013   Procedure: LEFT HEART CATHETERIZATION WITH CORONARY ANGIOGRAM;  Surgeon: Pamella Pert, MD;  Location: [facility] CATH LAB;  Service: Cardiovascular;  Laterality: N/A;  . RIGHT/LEFT HEART CATH AND CORONARY ANGIOGRAPHY N/A 11/10/2019   Procedure: RIGHT/LEFT HEART CATH AND CORONARY ANGIOGRAPHY;  Surgeon: Elder Negus, MD;  Location: [facility] INVASIVE CV LAB;  Service: Cardiovascular;  Laterality: N/A;  . TEE WITHOUT CARDIOVERSION N/A 05/12/2020   Procedure: TRANSESOPHAGEAL ECHOCARDIOGRAM (TEE);  Surgeon: Linden Dolin, MD;  Location: [facility] OR;  Service: Open Heart Surgery;  Laterality: N/A;   Family History  Problem Relation Age of Onset  . Failure to thrive Mother   . Diabetes Mother   . Heart attack Father   . Prostate cancer Father     Social History   Tobacco Use  . Smoking status: Never Smoker  . Smokeless tobacco: Never Used  Substance Use Topics  . Alcohol use: No   ROS  Review of Systems  Cardiovascular: Negative for chest pain, dyspnea on exertion and leg swelling.  Gastrointestinal: Negative for melena.  Psychiatric/Behavioral: Positive for depression.   Objective  Blood pressure 111/89, pulse 89, height 6\' 2"  (1.88 m), weight 232 lb (105.2 kg), SpO2 94 %.  Vitals with BMI 06/30/2020 05/30/2020 05/19/2020  Height 6\' 2"  6\' 2"  -  Weight 232 lbs 244 lbs 13 oz -  BMI 29.77  31.42 -  Systolic 111 106 161  Diastolic 89 74 73  Pulse 89 64 70     Physical Exam Constitutional:      General: He is not in acute distress.    Comments: Well-built and mildly to moderately obese  Cardiovascular:     Rate and Rhythm: Normal rate and regular rhythm.     Pulses: Normal pulses and intact distal pulses.          Carotid pulses are 2+ on the right side with bruit and 2+ on the left side with bruit.    Heart sounds: S1 normal and S2 normal. No murmur heard. No gallop.      Comments: There is no leg edema.  No JVD.  Pulmonary:      Effort: Pulmonary effort is normal.     Breath sounds: Normal breath sounds.  Abdominal:     General: Bowel sounds are normal.     Palpations: Abdomen is soft.     Tenderness: There is no rebound.    Laboratory examination:   Recent Labs    11/10/19 1017 11/10/19 1539 05/17/20 0147 05/18/20 0052 05/19/20 0112  NA 136   < > 133* 134* 135  K 4.0   < > 4.0 4.1 4.2  CL 103   < > 95* 95* 98  CO2 23   < > 27 27 29   GLUCOSE 106*   < > 116* 106* 101*  BUN 17   < > 25* 28* 23  CREATININE 1.08   < > 1.16 1.28* 1.22  CALCIUM 8.8*   < > 8.6* 8.6* 8.5*  GFRNONAA >60   < > >60 >60 >60  GFRAA >60  --   --   --   --    < > = values in this interval not displayed.   CrCl cannot be calculated (Patient's most recent lab result is older than the maximum 21 days allowed.).  CMP Latest Ref Rng & Units 05/19/2020 05/18/2020 05/17/2020  Glucose 70 - 99 mg/dL 096(E) 454(U) 981(X)  BUN 8 - 23 mg/dL 23 91(Y) 78(G)  Creatinine 0.61 - 1.24 mg/dL 9.56 2.13(Y) 8.65  Sodium 135 - 145 mmol/L 135 134(L) 133(L)  Potassium 3.5 - 5.1 mmol/L 4.2 4.1 4.0  Chloride 98 - 111 mmol/L 98 95(L) 95(L)  CO2 22 - 32 mmol/L 29 27 27   Calcium 8.9 - 10.3 mg/dL 7.8(I) 6.9(G) 2.9(B)  Total Protein 6.5 - 8.1 g/dL - - 6.4(L)  Total Bilirubin 0.3 - 1.2 mg/dL - - 1.4(H)  Alkaline Phos 38 - 126 U/L - - 89  AST 15 - 41 U/L - - 36  ALT 0 - 44 U/L - - 38   CBC Latest Ref Rng & Units 05/18/2020 05/17/2020 05/16/2020  WBC 4.0 - 10.5 K/uL 6.8 6.8 7.2  Hemoglobin 13.0 - 17.0 g/dL 2.8(U) 1.3(K) 4.4(W)  Hematocrit 39.0 - 52.0 % 26.2(L) 26.4(L) 23.1(L)  Platelets 150 - 400 K/uL 175 164 150   External labs:   Cholesterol, total 144.000 M 03/26/2019 HDL 37.000 MG 03/26/2019 LDL-C 70.000 MG 09/22/2018 Triglycerides 164.000 M 03/26/2019  A1C 5.800 % 03/26/2019; TSH 3.100 07/17/2016  Creatinine, Serum 1.490 MG/ 03/26/2019 Potassium 4.200 MM 01/10/2016 ALT (SGPT) 24.000 IU/ 03/26/2019  Hemoglobin 11.900 G/ 03/26/2019; Platelets  213.000 X 03/26/2019 Medications and allergies   Allergies  Allergen Reactions  . Bupropion Other (See Comments)    MADE CHEST HURT  . Naproxen Sodium Other (See Comments)    GI BLEEDING  Current Outpatient Medications on File Prior to Visit  Medication Sig Dispense Refill  . acetaminophen (TYLENOL) 500 MG tablet Take 500 mg by mouth every 6 (six) hours as needed for moderate pain or headache.     Marland Kitchen aspirin EC 325 MG EC tablet Take 1 tablet (325 mg total) by mouth daily.    . carvedilol (COREG) 6.25 MG tablet TAKE 1 TABLET BY MOUTH  TWICE DAILY WITH A MEAL 180 tablet 3  . escitalopram (LEXAPRO) 10 MG tablet Take 1 tablet (10 mg total) by mouth daily. 30 tablet 2  . Multiple Vitamins-Minerals (CENTRUM SILVER PO) Take 1 tablet by mouth every evening.     Marland Kitchen omeprazole (PRILOSEC) 20 MG capsule Take 1 capsule by mouth daily.    . polyethylene glycol (MIRALAX / GLYCOLAX) 17 g packet Take 17 g by mouth daily.    . rosuvastatin (CRESTOR) 20 MG tablet Take 1 tablet (20 mg total) by mouth daily.     No current facility-administered medications on file prior to visit.   Radiology:   CT Chest 08/13/2019 1. A 5.0 cm dilation of ascending thoracic aorta, likely associated with aortic stenosis due to dense calcifications about the aortic valve. Ascending thoracic aortic aneurysm. Recommend semi-annual imaging followup by CTA or MRA and referral to cardiothoracic surgery if not already obtained.   2. Coronary artery calcifications and signs of previous percutaneous coronary intervention. 3. 4 mm nodule in the left lung base. 4. 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  Cardiac Studies:   Echocardiogram 08/04/2019:  Left ventricle cavity is normal in size. Mild concentric hypertrophy of the left ventricle. Normal LV systolic function with EF 64%. Normal global wall motion. Doppler evidence of grade I (impaired) diastolic dysfunction, normal LAP.  Bicuspid aortic valve with  fused left and right coronary cusp. Severe calcification of noncoronary cusp. Moderate aortic stenosis. Trace aortic regurgitation. Aortic valve mean gradient of 25 mmHg, Vmax of 3.2 m/s. Calculated aortic valve area by continuity equation is 1.2 cm.  Mild tricuspid regurgitation. Estimated pulmonary artery systolic pressure is 19 mmHg.  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  Compared to previous study on 06/25/2019, proximal ascending aorta dimension increased from 4.3 cm to 4.5 cm, s/p bicuspid aortopathy.   Coronary angiogram 04/20/2013: Non-ST elevation myocardial infarction 2. S/P Proximal and mid LAD 4.0 x 28 mm stent, proximal and mid circumflex 4.0 x 20 mm Promus Premier drug-eluting stent implantation. Mid LAD stent (09/30/2005 with 3.5 x 13 mmx2 overlapping stents) patent.  Coronary Angiogram 11/11/2019: LM: Normal LAD: Patent prox LAD stent with 20% late lumen loss at proximal edge LCx: Patent prox stent  [place]: Normal RCA: 95% stenosis in small RV marginal branch, unchanged compared to previous angiogram in 2014  Normal RHC. No pulmonary hypertension.  Carotid artery duplex 06/28/2020: Peak systolic velocities in the right bifurcation, internal, external and common carotid arteries are within normal limits. Duplex suggests stenosis in the left internal carotid artery (16-49%). Antegrade right vertebral artery flow. Antegrade left vertebral artery flow. Follow up in one year is appropriate if clinically indicated. Compared to 08/19/2019, no significant change.  S/P AVR (aortic valve replacement) and aortoplasty-Bentall procedure 05/12/2020:  25 mm Bovine valve and 30 mm Hemashield gold graft.  EKG:      EKG 06/30/2020: Sinus rhythm with first-degree AV block at rate of 99 bpm, left axis deviation, left intrafascicular block.  Right bundle branch block.  Trifascicular block.    EKG 03/24/2020: Normal sinus rhythm at  rate of 70 bpm, rightward axis, RVH (RBBB).  Poor R wave  progression, cannot exclude anteroseptal infarct old.  Low-voltage complexes.  Pulmonary disease pattern.  Nonspecific T abnormality. No significant change from EKG 08/07/2019   Assessment     ICD-10-CM   1. Bicuspid aortic valve with ascending aorta 4.0 to 4.5 cm in diameter  Q23.1 EKG 12-Lead  2. S/P AVR (aortic valve replacement) and aortoplasty-Bentall procedure 05/12/2020:  25 mm Bovine valve and 30 mm Hemashield gold graft  Z95.2 PCV ECHOCARDIOGRAM COMPLETE  3. Thoracic aortic aneurysm without rupture (HCC)  I71.2 PCV ECHOCARDIOGRAM COMPLETE  4. Atherosclerosis of native coronary artery of native heart without angina pectoris  I25.10 nitroGLYCERIN (NITROSTAT) 0.4 MG SL tablet  5. SBE (subacute bacterial endocarditis) prophylaxis candidate  Z29.8   6. Asymptomatic stenosis of left carotid artery  I65.22 PCV CAROTID DUPLEX (BILATERAL)    Meds ordered this encounter  Medications  . nitroGLYCERIN (NITROSTAT) 0.4 MG SL tablet    Sig: Place 1 tablet (0.4 mg total) under the tongue every 5 (five) minutes as needed for up to 25 days for chest pain.    Dispense:  25 tablet    Refill:  3    Medications Discontinued During This Encounter  Medication Reason  . metoprolol tartrate (LOPRESSOR) 25 MG tablet Discontinued by provider  . ASPIRIN 81 PO Dose change  . ferrous fumarate-b12-vitamic C-folic acid (TRINSICON / FOLTRIN) capsule Completed Course  . traMADol (ULTRAM) 50 MG tablet Error  . pantoprazole (PROTONIX) 40 MG tablet Change in therapy  . amiodarone (PACERONE) 200 MG tablet Completed Course  . ASPIRIN 81 PO Dose change    Orders Placed This Encounter  Procedures  . EKG 12-Lead  . PCV ECHOCARDIOGRAM COMPLETE    Standing Status:   Future    Standing Expiration Date:   06/30/2021    Recommendations:   Devon Sanchez  is a 70 y.o. Caucasian male  with CAD S/P Prox LAD stent in 2007 and again  on 04/20/2013 underwent stenting to the proximal and mid LAD and proximal and mid  circumflex. 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  He was briefly in atrial fibrillation postoperatively, maintaining sinus rhythm, he is now discontinued amiodarone.  Sternotomy scar has healed well.  The patient is well controlled, lipids are also under excellent control.  He has postop anemia but remains asymptomatic.  Will consider rechecking his CBC in 3 to 6 months unless symptomatic.  I refilled his nitroglycerin for CAD.  Carotid artery duplex does not reveal any significant abnormality with minimal disease, follow-up in 1 year.  Carotid duplex in the hospital preprocedure had revealed minimal disease as well.  I would like to see him back in 6 weeks.  He will need echocardiogram to establish baseline.  He is also endocarditis prophylaxis candidate.     Devon Decamp, MD, [facility] 07/01/2020, 4:48 AM Office: [phone]

## 2020-07-01 ENCOUNTER — Encounter (HOSPITAL_COMMUNITY)
Admission: RE | Admit: 2020-07-01 | Discharge: 2020-07-01 | Disposition: A | Discharge status: Home / Self Care | Payer: Medicare Other | Source: Ambulatory Visit | Attending: Cardiology | Admitting: Cardiology

## 2020-07-01 ENCOUNTER — Encounter (HOSPITAL_COMMUNITY): Payer: Self-pay

## 2020-07-01 VITALS — BP 98/70 | HR 99 | Ht 74.0 in | Wt 239.0 lb

## 2020-07-01 DIAGNOSIS — Z952 Presence of prosthetic heart valve: Secondary | ICD-10-CM | POA: Insufficient documentation

## 2020-07-01 DIAGNOSIS — Z8679 Personal history of other diseases of the circulatory system: Secondary | ICD-10-CM | POA: Diagnosis not present

## 2020-07-01 DIAGNOSIS — Z9889 Other specified postprocedural states: Secondary | ICD-10-CM | POA: Diagnosis not present

## 2020-07-01 DIAGNOSIS — Z298 Encounter for other specified prophylactic measures: Secondary | ICD-10-CM | POA: Insufficient documentation

## 2020-07-01 NOTE — Progress Notes (Signed)
Cardiac Individual Treatment Plan  Patient Details  Name: Devon Sanchez MRN: [id number] Date of Birth: Oct 31, 1950 Referring Provider:   Flowsheet Row CARDIAC REHAB PHASE II ORIENTATION from 07/01/2020 in [place]  Referring Provider Dr. Orvan Seen      Initial Encounter Date:  Flowsheet Row CARDIAC REHAB PHASE II ORIENTATION from 07/01/2020 in [place]  Date 07/01/20      Visit Diagnosis: S/P aortic aneurysm repair  S/P TAVR (transcatheter aortic valve replacement)  Patient's Home Medications on Admission:  Current Outpatient Medications:  .  acetaminophen (TYLENOL) 500 MG tablet, Take 500 mg by mouth every 6 (six) hours as needed for moderate pain or headache. , Disp: , Rfl:  .  aspirin EC 325 MG EC tablet, Take 1 tablet (325 mg total) by mouth daily., Disp: , Rfl:  .  carvedilol (COREG) 6.25 MG tablet, TAKE 1 TABLET BY MOUTH  TWICE DAILY WITH A MEAL, Disp: 180 tablet, Rfl: 3 .  escitalopram (LEXAPRO) 10 MG tablet, Take 1 tablet (10 mg total) by mouth daily., Disp: 30 tablet, Rfl: 2 .  Multiple Vitamins-Minerals (CENTRUM SILVER PO), Take 1 tablet by mouth every evening. , Disp: , Rfl:  .  nitroGLYCERIN (NITROSTAT) 0.4 MG SL tablet, Place 1 tablet (0.4 mg total) under the tongue every 5 (five) minutes as needed for up to 25 days for chest pain. (Patient taking differently: Place 0.4 mg under the tongue every 5 (five) minutes as needed for chest pain. Pt hasn't used in ~10 years, but keeps it on hand in case), Disp: 25 tablet, Rfl: 3 .  Omega-3 Fatty Acids (FISH OIL) 1200 MG CAPS, Take 3 capsules by mouth at bedtime., Disp: , Rfl:  .  omeprazole (PRILOSEC) 20 MG capsule, Take 1 capsule by mouth daily., Disp: , Rfl:  .  polyethylene glycol (MIRALAX / GLYCOLAX) 17 g packet, Take 17 g by mouth daily., Disp: , Rfl:  .  rosuvastatin (CRESTOR) 20 MG tablet, Take 1 tablet (20 mg total) by mouth daily., Disp: , Rfl:   Past Medical History: Past  Medical History:  Diagnosis Date  . Anxiety   . Aortic root dilatation ([place])   . Atherosclerosis   . Bicuspid aortic valve 07/25/2018  . Coronary artery disease   . Depression   . Dizziness and giddiness   . Erectile dysfunction   . Hypercholesterolemia   . Hyperglycemia   . Hyperlipidemia   . Hypertension   . MI (myocardial infarction) ([place])   . Midsystolic murmur    bicuspid AV with moderate AS (08/2019)  . Reflux   . Sleep apnea   . Thoracic ascending aortic aneurysm ([facility])     Tobacco Use: Social History   Tobacco Use  Smoking Status Never Smoker  Smokeless Tobacco Never Used    Labs: Recent Review Flowsheet Data    Labs for ITP Cardiac and Pulmonary Rehab Latest Ref Rng & Units 05/12/2020 05/12/2020 05/12/2020 05/12/2020 05/12/2020   Hemoglobin A1c 4.8 - 5.6 % - - - - -   PHART 7.350 - 7.450 7.350 - 7.371 7.338(L) 7.365   PCO2ART 32.0 - 48.0 mmHg 43.9 - 40.4 45.4 40.5   HCO3 20.0 - 28.0 mmol/L 24.2 - 24.0 24.3 23.1   TCO2 22 - 32 mmol/L 26 23 25 26 24    ACIDBASEDEF 0.0 - 2.0 mmol/L 1.0 - 2.0 1.0 2.0   O2SAT % 99.0 - 94.0 95.0 92.0      Capillary Blood Glucose: Lab Results  Component Value Date   GLUCAP 119 (H) 05/19/2020   GLUCAP 113 (H) 05/18/2020   GLUCAP 108 (H) 05/18/2020   GLUCAP 98 05/18/2020   GLUCAP 94 05/18/2020     Exercise Target Goals: Exercise Program Goal: Individual exercise prescription set using results from initial 6 min walk test and THRR while considering  patient's activity barriers and safety.   Exercise Prescription Goal: Starting with aerobic activity 30 plus minutes a day, 3 days per week for initial exercise prescription. Provide home exercise prescription and guidelines that participant acknowledges understanding prior to discharge.  Activity Barriers & Risk Stratification:  Activity Barriers & Cardiac Risk Stratification - 07/01/20 1248      Activity Barriers & Cardiac Risk Stratification   Activity Barriers Back Problems     Cardiac Risk Stratification High           6 Minute Walk:  6 Minute Walk    Row Name 07/01/20 1419         6 Minute Walk   Phase Initial     Distance 1375 feet     Walk Time 6 minutes     # of Rest Breaks 0     MPH 2.6     METS 3.22     RPE 10     VO2 Peak 11.29     Symptoms Yes (comment)     Comments 2/10 hip pain in both hips     Resting HR 99 bpm     Resting BP 98/70     Resting Oxygen Saturation  94 %     Exercise Oxygen Saturation  during 6 min walk 93 %     Max Ex. HR 126 bpm     Max Ex. BP 120/76     2 Minute Post BP 110/80            Oxygen Initial Assessment:   Oxygen Re-Evaluation:   Oxygen Discharge (Final Oxygen Re-Evaluation):   Initial Exercise Prescription:  Initial Exercise Prescription - 07/01/20 1400      Date of Initial Exercise RX and Referring Provider   Date 07/01/20    Referring Provider Dr. Orvan Seen    Expected Discharge Date 09/23/20      Treadmill   MPH 1.5    Grade 0    Minutes 22      Recumbant Elliptical   Level 1    RPM 60    Minutes 17      Intensity   THRR 40-80% of Max Heartrate 61-122    Ratings of Perceived Exertion 11-13      Resistance Training   Training Prescription Yes    Weight 3 lbs    Reps 10-15           Perform Capillary Blood Glucose checks as needed.  Exercise Prescription Changes:   Exercise Comments:   Exercise Goals and Review:   Exercise Goals    Row Name 07/01/20 1424             Exercise Goals   Increase Physical Activity Yes       Intervention Provide advice, education, support and counseling about physical activity/exercise needs.;Develop an individualized exercise prescription for aerobic and resistive training based on initial evaluation findings, risk stratification, comorbidities and participant's personal goals.       Expected Outcomes Short Term: Attend rehab on a regular basis to increase amount of physical activity.;Long Term: Add in home exercise to make  exercise part of routine and to  increase amount of physical activity.;Long Term: Exercising regularly at least 3-5 days a week.       Increase Strength and Stamina Yes       Intervention Provide advice, education, support and counseling about physical activity/exercise needs.;Develop an individualized exercise prescription for aerobic and resistive training based on initial evaluation findings, risk stratification, comorbidities and participant's personal goals.       Expected Outcomes Short Term: Increase workloads from initial exercise prescription for resistance, speed, and METs.;Short Term: Perform resistance training exercises routinely during rehab and add in resistance training at home;Long Term: Improve cardiorespiratory fitness, muscular endurance and strength as measured by increased METs and functional capacity (6MWT)       Able to understand and use rate of perceived exertion (RPE) scale Yes       Intervention Provide education and explanation on how to use RPE scale       Expected Outcomes Short Term: Able to use RPE daily in rehab to express subjective intensity level;Long Term:  Able to use RPE to guide intensity level when exercising independently       Knowledge and understanding of Target Heart Rate Range (THRR) Yes       Intervention Provide education and explanation of THRR including how the numbers were predicted and where they are located for reference       Expected Outcomes Short Term: Able to state/look up THRR;Long Term: Able to use THRR to govern intensity when exercising independently;Short Term: Able to use daily as guideline for intensity in rehab       Able to check pulse independently Yes       Intervention Provide education and demonstration on how to check pulse in carotid and radial arteries.;Review the importance of being able to check your own pulse for safety during independent exercise       Expected Outcomes Short Term: Able to explain why pulse checking is  important during independent exercise;Long Term: Able to check pulse independently and accurately       Understanding of Exercise Prescription Yes       Intervention Provide education, explanation, and written materials on patient's individual exercise prescription       Expected Outcomes Short Term: Able to explain program exercise prescription;Long Term: Able to explain home exercise prescription to exercise independently              Exercise Goals Re-Evaluation :    Discharge Exercise Prescription (Final Exercise Prescription Changes):   Nutrition:  Target Goals: Understanding of nutrition guidelines, daily intake of sodium 1500mg , cholesterol 200mg , calories 30% from fat and 7% or less from saturated fats, daily to have 5 or more servings of fruits and vegetables.  Biometrics:  Pre Biometrics - 07/01/20 1424      Pre Biometrics   Height 6\' 2"  (1.88 m)    Weight 108.4 kg    Waist Circumference 45.25 inches    Hip Circumference 42 inches    Waist to Hip Ratio 1.08 %    BMI (Calculated) 30.67    Triceps Skinfold 8 mm    % Body Fat 31.3 %    Grip Strength 30.4 kg    Flexibility 12.5 in    Single Leg Stand 31 seconds            Nutrition Therapy Plan and Nutrition Goals:  Nutrition Therapy & Goals - 07/01/20 1439      Personal Nutrition Goals   Comments Patient scored 33 on his diet assessment.  He says his wife cooks some but they eat out a lot as well. He is interested in improving his diet. Assessment score reviewed and handout provided and explained regarding making healthier choices. Patient verbalized understanding.      Intervention Plan   Intervention Nutrition handout(s) given to patient.           Nutrition Assessments:  Nutrition Assessments - 07/01/20 1442      MEDFICTS Scores   Pre Score 33          MEDIFICTS Score Key:  ?70 Need to make dietary changes   40-70 Heart Healthy Diet  ? 40 Therapeutic Level Cholesterol Diet   Picture  Your Plate Scores:  D34-534 Unhealthy dietary pattern with much room for improvement.  41-50 Dietary pattern unlikely to meet recommendations for good health and room for improvement.  51-60 More healthful dietary pattern, with some room for improvement.   >60 Healthy dietary pattern, although there may be some specific behaviors that could be improved.    Nutrition Goals Re-Evaluation:   Nutrition Goals Discharge (Final Nutrition Goals Re-Evaluation):   Psychosocial: Target Goals: Acknowledge presence or absence of significant depression and/or stress, maximize coping skills, provide positive support system. Participant is able to verbalize types and ability to use techniques and skills needed for reducing stress and depression.  Initial Review & Psychosocial Screening:  Initial Psych Review & Screening - 07/01/20 1434      Initial Review   Current issues with History of Depression      Family Dynamics   Good Support System? Yes    Comments Patient lives with his wife of many years. She is his support system along with his daughter. He is currently taking escitalopram 10 mg daily and has been taking this for 20 years. He denies any depression, anxiety, or stress. He is looking forward to doing the program and getting back to his life. He demonstrates a very positive attitude regarding his health and his future.      Barriers   Psychosocial barriers to participate in program Psychosocial barriers identified (see note)      Screening Interventions   Interventions Encouraged to exercise    Expected Outcomes Short Term goal: Identification and review with participant of any Quality of Life or Depression concerns found by scoring the questionnaire.           Quality of Life Scores:  Quality of Life - 07/01/20 1425      Quality of Life   Select Quality of Life      Quality of Life Scores   Health/Function Pre 26.46 %    Socioeconomic Pre 27.5 %    Psych/Spiritual Pre 29.14 %     Family Pre 28.8 %    GLOBAL Pre 27.61 %          Scores of 19 and below usually indicate a poorer quality of life in these areas.  A difference of  2-3 points is a clinically meaningful difference.  A difference of 2-3 points in the total score of the Quality of Life Index has been associated with significant improvement in overall quality of life, self-image, physical symptoms, and general health in studies assessing change in quality of life.  PHQ-9: Recent Review Flowsheet Data    Depression screen PHQ 2/9 07/01/2020   Down, Depressed, Hopeless 1   PHQ - 2 Score 1   Altered sleeping 2    Tired, decreased energy 0   Change in appetite 0  Feeling bad or failure about yourself  0   Trouble concentrating 0   Moving slowly or fidgety/restless 0   Suicidal thoughts 0   PHQ-9 Score 3   Difficult doing work/chores Somewhat difficult     Interpretation of Total Score  Total Score Depression Severity:  1-4 = Minimal depression, 5-9 = Mild depression, 10-14 = Moderate depression, 15-19 = Moderately severe depression, 20-27 = Severe depression   Psychosocial Evaluation and Intervention:  Psychosocial Evaluation - 07/01/20 1437      Psychosocial Evaluation & Interventions   Interventions Stress management education;Relaxation education;Encouraged to exercise with the program and follow exercise prescription    Comments Patient has a history of depression and has been taking Lexapro for appx. 20 years. His initial QOL score was 27.61% overall and his PHQ-9 score was 3. Will continue to monitor.    Expected Outcomes Patient will have no psychosocial issues identified at discharge.    Continue Psychosocial Services  No Follow up required           Psychosocial Re-Evaluation:   Psychosocial Discharge (Final Psychosocial Re-Evaluation):   Vocational Rehabilitation: Provide vocational rehab assistance to qualifying candidates.   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           Education: Education Goals: Education classes will be provided on a weekly basis, covering required topics. Participant will state understanding/return demonstration of topics presented.  Learning Barriers/Preferences:  Learning Barriers/Preferences - 07/01/20 1442      Learning Barriers/Preferences   Learning Barriers None    Learning Preferences Written Material;Audio;Video           Education Topics: Hypertension, Hypertension Reduction -Define heart disease and high blood pressure. Discus how high blood pressure affects the body and ways to reduce high blood pressure.   Exercise and Your Heart -Discuss why it is important to exercise, the FITT principles of exercise, normal and abnormal responses to exercise, and how to exercise safely.   Angina -Discuss definition of angina, causes of angina, treatment of angina, and how to decrease risk of having angina.   Cardiac Medications -Review what the following cardiac medications are used for, how they affect the body, and side effects that may occur when taking the medications.  Medications include Aspirin, Beta blockers, calcium channel blockers, ACE Inhibitors, angiotensin receptor blockers, diuretics, digoxin, and antihyperlipidemics.   Congestive Heart Failure -Discuss the definition of CHF, how to live with CHF, the signs and symptoms of CHF, and how keep track of weight and sodium intake.   Heart Disease and Intimacy -Discus the effect sexual activity has on the heart, how changes occur during intimacy as we age, and safety during sexual activity.   Smoking Cessation / COPD -Discuss different methods to quit smoking, the health benefits of quitting smoking,  and the definition of COPD.   Nutrition I: Fats -Discuss the types of cholesterol, what cholesterol does to the heart, and how cholesterol levels can be controlled.   Nutrition II: Labels -Discuss the different components of food labels and how to read food label   Heart Parts/Heart Disease and PAD -Discuss the anatomy of the heart, the pathway of blood circulation through the heart, and these are affected by heart disease.  Stress I: Signs and Symptoms -Discuss the causes of stress, how stress may lead to anxiety and depression, and ways to limit stress.   Stress II: Relaxation -Discuss different types of relaxation techniques to limit stress.   Warning Signs of Stroke / TIA -Discuss definition of a stroke, what the signs and symptoms are of a stroke, and how to identify when someone is having stroke.   Knowledge Questionnaire Score:  Knowledge Questionnaire Score - 07/01/20 1443      Knowledge Questionnaire Score   Pre Score 22/24           Core Components/Risk Factors/Patient Goals at Admission:  Personal Goals and Risk Factors at Admission - 07/01/20 1443      Core Components/Risk Factors/Patient Goals on Admission    Weight Management Obesity    Personal Goal Other Yes    Personal Goal Get stronger; return to doing his ADL's; change his eating habits.    Intervention Provide education on lifestye changes and risk modification and monitored, supervised exercise.    Expected Outcomes Patient will meet his personal and program goals.           Core Components/Risk Factors/Patient Goals Review:    Core Components/Risk Factors/Patient Goals at Discharge (Final Review):    ITP Comments:   Comments: Patient arrived for 1st visit/orientation/education at 1230. Patient was referred to CR by Fredrich Romans, MD due to S/P Aortic Aneurysm Repair (Z98.890;Z86.79) and S/P Aortic Valve Replacement (Z95.2). During orientation advised patient on arrival and  appointment times what to wear, what to do before, during and after exercise. Reviewed attendance and class policy.  Pt is scheduled to return Cardiac Rehab on 07/04/20 at 11:00. Pt was advised to come to class 15 minutes before class starts.  Discussed RPE/Dpysnea scales. Patient participated in warm up stretches. Patient was able to complete 6 minute walk test.  Telemetry:NSR. Patient was measured for the equipment. Discussed equipment safety with patient. Took patient pre-anthropometric measurements. Patient finished visit at 1400.

## 2020-07-02 DIAGNOSIS — I2510 Atherosclerotic heart disease of native coronary artery without angina pectoris: Secondary | ICD-10-CM | POA: Diagnosis not present

## 2020-07-02 DIAGNOSIS — R7301 Impaired fasting glucose: Secondary | ICD-10-CM | POA: Diagnosis not present

## 2020-07-02 DIAGNOSIS — R42 Dizziness and giddiness: Secondary | ICD-10-CM | POA: Diagnosis not present

## 2020-07-02 DIAGNOSIS — I129 Hypertensive chronic kidney disease with stage 1 through stage 4 chronic kidney disease, or unspecified chronic kidney disease: Secondary | ICD-10-CM | POA: Diagnosis not present

## 2020-07-02 DIAGNOSIS — D509 Iron deficiency anemia, unspecified: Secondary | ICD-10-CM | POA: Diagnosis not present

## 2020-07-02 DIAGNOSIS — E785 Hyperlipidemia, unspecified: Secondary | ICD-10-CM | POA: Diagnosis not present

## 2020-07-04 ENCOUNTER — Other Ambulatory Visit: Payer: Self-pay

## 2020-07-04 ENCOUNTER — Encounter (HOSPITAL_COMMUNITY)
Admission: RE | Admit: 2020-07-04 | Discharge: 2020-07-04 | Disposition: A | Discharge status: Home / Self Care | Payer: Medicare Other | Source: Ambulatory Visit | Attending: Cardiology | Admitting: Cardiology

## 2020-07-04 DIAGNOSIS — Z8679 Personal history of other diseases of the circulatory system: Secondary | ICD-10-CM | POA: Diagnosis not present

## 2020-07-04 DIAGNOSIS — Z952 Presence of prosthetic heart valve: Secondary | ICD-10-CM

## 2020-07-04 DIAGNOSIS — Z9889 Other specified postprocedural states: Secondary | ICD-10-CM | POA: Diagnosis not present

## 2020-07-04 NOTE — Progress Notes (Signed)
Daily Session Note  Patient Details  Name: MORY HERRMAN MRN: [id number] Date of Birth: 1951/03/14 Referring Provider:   Flowsheet Row CARDIAC REHAB PHASE II ORIENTATION from 07/01/2020 in [place]  Referring Provider Dr. Orvan Seen      Encounter Date: 07/04/2020  Check In:  Session Check In - 07/04/20 1100      Check-In   Supervising physician immediately available to respond to emergencies [facility] MD immediately available    Physician(s) Domenic Polite    Location [facility]    Staff Present Geanie Cooley, RN;Debra Wynetta Emery, RN, BSN;Madison Audria Nine, MS, Exercise Physiologist;Dalton Kris Mouton, MS, ACSM-CEP, Exercise Physiologist    Virtual Visit No    Medication changes reported     No    Fall or balance concerns reported    No    Tobacco Cessation No Change    Warm-up and Cool-down Performed as group-led instruction    Resistance Training Performed Yes    VAD Patient? No    PAD/SET Patient? No      Pain Assessment   Currently in Pain? No/denies    Pain Score 0-No pain    Multiple Pain Sites No           Capillary Blood Glucose: No results found for this or any previous visit (from the past 24 hour(s)).    Social History   Tobacco Use  Smoking Status Never Smoker  Smokeless Tobacco Never Used    Goals Met:  Independence with exercise equipment Exercise tolerated well No report of cardiac concerns or symptoms Strength training completed today  Goals Unmet:  Not Applicable  Comments: check out @ 12:00   Dr. Kathie Dike is Medical Director for [facility] Pulmonary Rehab.

## 2020-07-06 ENCOUNTER — Encounter (HOSPITAL_COMMUNITY)
Admission: RE | Admit: 2020-07-06 | Discharge: 2020-07-06 | Disposition: A | Discharge status: Home / Self Care | Payer: Medicare Other | Source: Ambulatory Visit | Attending: Cardiology | Admitting: Cardiology

## 2020-07-06 ENCOUNTER — Other Ambulatory Visit: Payer: Self-pay

## 2020-07-06 ENCOUNTER — Ambulatory Visit: Payer: Medicare Other | Admitting: Cardiology

## 2020-07-06 DIAGNOSIS — Z9889 Other specified postprocedural states: Secondary | ICD-10-CM | POA: Diagnosis not present

## 2020-07-06 DIAGNOSIS — Z8679 Personal history of other diseases of the circulatory system: Secondary | ICD-10-CM | POA: Diagnosis not present

## 2020-07-06 DIAGNOSIS — Z952 Presence of prosthetic heart valve: Secondary | ICD-10-CM | POA: Diagnosis not present

## 2020-07-06 NOTE — Progress Notes (Signed)
Daily Session Note  Patient Details  Name: Devon Sanchez MRN: [id number] Date of Birth: 04/21/1951 Referring Provider:   Flowsheet Row CARDIAC REHAB PHASE II ORIENTATION from 07/01/2020 in [place]  Referring Provider Dr. Orvan Seen      Encounter Date: 07/06/2020  Check In:  Session Check In - 07/06/20 1100      Check-In   Supervising physician immediately available to respond to emergencies [facility] MD immediately available    Physician(s) Dr. Domenic Polite    Location [facility]    Staff Present Aundra Dubin, RN, BSN;Tashonda Pinkus Audria Nine, MS, Exercise Physiologist    Virtual Visit No    Medication changes reported     No    Fall or balance concerns reported    No    Tobacco Cessation No Change    Warm-up and Cool-down Performed as group-led instruction    Resistance Training Performed Yes    VAD Patient? No    PAD/SET Patient? No      Pain Assessment   Currently in Pain? No/denies    Pain Score 0-No pain    Multiple Pain Sites No           Capillary Blood Glucose: No results found for this or any previous visit (from the past 24 hour(s)).    Social History   Tobacco Use  Smoking Status Never Smoker  Smokeless Tobacco Never Used    Goals Met:  Independence with exercise equipment Exercise tolerated well No report of cardiac concerns or symptoms Strength training completed today  Goals Unmet:  Not Applicable  Comments: check out 1200   Dr. Kathie Dike is Medical Director for [facility] Pulmonary Rehab.

## 2020-07-07 ENCOUNTER — Ambulatory Visit: Payer: Medicare Other | Admitting: Cardiology

## 2020-07-08 ENCOUNTER — Encounter (HOSPITAL_COMMUNITY)
Admission: RE | Admit: 2020-07-08 | Discharge: 2020-07-08 | Disposition: A | Discharge status: Home / Self Care | Payer: Medicare Other | Source: Ambulatory Visit | Attending: Cardiology | Admitting: Cardiology

## 2020-07-08 ENCOUNTER — Other Ambulatory Visit: Payer: Self-pay

## 2020-07-08 VITALS — Wt 239.9 lb

## 2020-07-08 DIAGNOSIS — Z9889 Other specified postprocedural states: Secondary | ICD-10-CM | POA: Diagnosis not present

## 2020-07-08 DIAGNOSIS — Z8679 Personal history of other diseases of the circulatory system: Secondary | ICD-10-CM | POA: Diagnosis not present

## 2020-07-08 DIAGNOSIS — Z952 Presence of prosthetic heart valve: Secondary | ICD-10-CM

## 2020-07-08 NOTE — Progress Notes (Signed)
Daily Session Note  Patient Details  Name: VICTORHUGO PREIS MRN: [id number] Date of Birth: 06-28-1950 Referring Provider:   Flowsheet Row CARDIAC REHAB PHASE II ORIENTATION from 07/01/2020 in [place]  Referring Provider Dr. Orvan Seen      Encounter Date: 07/08/2020  Check In:  Session Check In - 07/08/20 1100      Check-In   Supervising physician immediately available to respond to emergencies [facility] MD immediately available    Physician(s) Dr. Harl Bowie    Location [facility]    Staff Present Aundra Dubin, RN, BSN;Fayola Meckes Audria Nine, MS, Exercise Physiologist    Virtual Visit No    Medication changes reported     No    Fall or balance concerns reported    No    Tobacco Cessation No Change    Warm-up and Cool-down Performed as group-led instruction    VAD Patient? No    PAD/SET Patient? No      Pain Assessment   Currently in Pain? No/denies    Pain Score 0-No pain    Multiple Pain Sites No           Capillary Blood Glucose: No results found for this or any previous visit (from the past 24 hour(s)).    Social History   Tobacco Use  Smoking Status Never Smoker  Smokeless Tobacco Never Used    Goals Met:  Independence with exercise equipment Exercise tolerated well No report of cardiac concerns or symptoms Strength training completed today  Goals Unmet:  Not Applicable  Comments: checkout 1200   Dr. Kathie Dike is Medical Director for [facility] Pulmonary Rehab.

## 2020-07-11 ENCOUNTER — Encounter (HOSPITAL_COMMUNITY): Payer: Medicare Other

## 2020-07-13 ENCOUNTER — Other Ambulatory Visit: Payer: Self-pay

## 2020-07-13 ENCOUNTER — Encounter (HOSPITAL_COMMUNITY)
Admission: RE | Admit: 2020-07-13 | Discharge: 2020-07-13 | Disposition: A | Discharge status: Home / Self Care | Payer: Medicare Other | Source: Ambulatory Visit | Attending: Cardiology | Admitting: Cardiology

## 2020-07-13 DIAGNOSIS — Z8679 Personal history of other diseases of the circulatory system: Secondary | ICD-10-CM | POA: Diagnosis not present

## 2020-07-13 DIAGNOSIS — Z952 Presence of prosthetic heart valve: Secondary | ICD-10-CM

## 2020-07-13 DIAGNOSIS — Z9889 Other specified postprocedural states: Secondary | ICD-10-CM | POA: Diagnosis not present

## 2020-07-13 NOTE — Progress Notes (Signed)
Daily Session Note  Patient Details  Name: Devon Sanchez MRN: [id number] Date of Birth: 1950/12/10 Referring Provider:   Flowsheet Row CARDIAC REHAB PHASE II ORIENTATION from 07/01/2020 in [place]  Referring Provider Dr. Orvan Seen      Encounter Date: 07/13/2020  Check In:  Session Check In - 07/13/20 1100      Check-In   Supervising physician immediately available to respond to emergencies [facility] MD immediately available    Physician(s) Dr. Harl Bowie    Location [facility]    Staff Present Aundra Dubin, RN, BSN;Avelina Mcclurkin Audria Nine, MS, Exercise Physiologist    Virtual Visit No    Medication changes reported     No    Fall or balance concerns reported    No    Tobacco Cessation No Change    Warm-up and Cool-down Performed as group-led instruction    Resistance Training Performed Yes    VAD Patient? No    PAD/SET Patient? No      Pain Assessment   Currently in Pain? No/denies    Pain Score 0-No pain    Multiple Pain Sites No           Capillary Blood Glucose: No results found for this or any previous visit (from the past 24 hour(s)).    Social History   Tobacco Use  Smoking Status Never Smoker  Smokeless Tobacco Never Used    Goals Met:  Independence with exercise equipment Exercise tolerated well No report of cardiac concerns or symptoms Strength training completed today  Goals Unmet:  Not Applicable  Comments: checkout 1200   Dr. Kathie Dike is Medical Director for [facility] Pulmonary Rehab.

## 2020-07-13 NOTE — Progress Notes (Signed)
Cardiac Individual Treatment Plan  Patient Details  Name: Devon Sanchez MRN: [id number] Date of Birth: Oct 31, 1950 Referring Provider:   Flowsheet Row CARDIAC REHAB PHASE II ORIENTATION from 07/01/2020 in [place]  Referring Provider Dr. Orvan Seen      Initial Encounter Date:  Flowsheet Row CARDIAC REHAB PHASE II ORIENTATION from 07/01/2020 in [place]  Date 07/01/20      Visit Diagnosis: S/P aortic aneurysm repair  S/P TAVR (transcatheter aortic valve replacement)  Patient's Home Medications on Admission:  Current Outpatient Medications:  .  acetaminophen (TYLENOL) 500 MG tablet, Take 500 mg by mouth every 6 (six) hours as needed for moderate pain or headache. , Disp: , Rfl:  .  aspirin EC 325 MG EC tablet, Take 1 tablet (325 mg total) by mouth daily., Disp: , Rfl:  .  carvedilol (COREG) 6.25 MG tablet, TAKE 1 TABLET BY MOUTH  TWICE DAILY WITH A MEAL, Disp: 180 tablet, Rfl: 3 .  escitalopram (LEXAPRO) 10 MG tablet, Take 1 tablet (10 mg total) by mouth daily., Disp: 30 tablet, Rfl: 2 .  Multiple Vitamins-Minerals (CENTRUM SILVER PO), Take 1 tablet by mouth every evening. , Disp: , Rfl:  .  nitroGLYCERIN (NITROSTAT) 0.4 MG SL tablet, Place 1 tablet (0.4 mg total) under the tongue every 5 (five) minutes as needed for up to 25 days for chest pain. (Patient taking differently: Place 0.4 mg under the tongue every 5 (five) minutes as needed for chest pain. Pt hasn't used in ~10 years, but keeps it on hand in case), Disp: 25 tablet, Rfl: 3 .  Omega-3 Fatty Acids (FISH OIL) 1200 MG CAPS, Take 3 capsules by mouth at bedtime., Disp: , Rfl:  .  omeprazole (PRILOSEC) 20 MG capsule, Take 1 capsule by mouth daily., Disp: , Rfl:  .  polyethylene glycol (MIRALAX / GLYCOLAX) 17 g packet, Take 17 g by mouth daily., Disp: , Rfl:  .  rosuvastatin (CRESTOR) 20 MG tablet, Take 1 tablet (20 mg total) by mouth daily., Disp: , Rfl:   Past Medical History: Past  Medical History:  Diagnosis Date  . Anxiety   . Aortic root dilatation ([place])   . Atherosclerosis   . Bicuspid aortic valve 07/25/2018  . Coronary artery disease   . Depression   . Dizziness and giddiness   . Erectile dysfunction   . Hypercholesterolemia   . Hyperglycemia   . Hyperlipidemia   . Hypertension   . MI (myocardial infarction) ([place])   . Midsystolic murmur    bicuspid AV with moderate AS (08/2019)  . Reflux   . Sleep apnea   . Thoracic ascending aortic aneurysm ([facility])     Tobacco Use: Social History   Tobacco Use  Smoking Status Never Smoker  Smokeless Tobacco Never Used    Labs: Recent Review Flowsheet Data    Labs for ITP Cardiac and Pulmonary Rehab Latest Ref Rng & Units 05/12/2020 05/12/2020 05/12/2020 05/12/2020 05/12/2020   Hemoglobin A1c 4.8 - 5.6 % - - - - -   PHART 7.350 - 7.450 7.350 - 7.371 7.338(L) 7.365   PCO2ART 32.0 - 48.0 mmHg 43.9 - 40.4 45.4 40.5   HCO3 20.0 - 28.0 mmol/L 24.2 - 24.0 24.3 23.1   TCO2 22 - 32 mmol/L 26 23 25 26 24    ACIDBASEDEF 0.0 - 2.0 mmol/L 1.0 - 2.0 1.0 2.0   O2SAT % 99.0 - 94.0 95.0 92.0      Capillary Blood Glucose: Lab Results  Component Value Date   GLUCAP 119 (H) 05/19/2020   GLUCAP 113 (H) 05/18/2020   GLUCAP 108 (H) 05/18/2020   GLUCAP 98 05/18/2020   GLUCAP 94 05/18/2020     Exercise Target Goals: Exercise Program Goal: Individual exercise prescription set using results from initial 6 min walk test and THRR while considering  patient's activity barriers and safety.   Exercise Prescription Goal: Starting with aerobic activity 30 plus minutes a day, 3 days per week for initial exercise prescription. Provide home exercise prescription and guidelines that participant acknowledges understanding prior to discharge.  Activity Barriers & Risk Stratification:  Activity Barriers & Cardiac Risk Stratification - 07/01/20 1248      Activity Barriers & Cardiac Risk Stratification   Activity Barriers Back Problems     Cardiac Risk Stratification High           6 Minute Walk:  6 Minute Walk    Row Name 07/01/20 1419         6 Minute Walk   Phase Initial     Distance 1375 feet     Walk Time 6 minutes     # of Rest Breaks 0     MPH 2.6     METS 3.22     RPE 10     VO2 Peak 11.29     Symptoms Yes (comment)     Comments 2/10 hip pain in both hips     Resting HR 99 bpm     Resting BP 98/70     Resting Oxygen Saturation  94 %     Exercise Oxygen Saturation  during 6 min walk 93 %     Max Ex. HR 126 bpm     Max Ex. BP 120/76     2 Minute Post BP 110/80            Oxygen Initial Assessment:   Oxygen Re-Evaluation:   Oxygen Discharge (Final Oxygen Re-Evaluation):   Initial Exercise Prescription:  Initial Exercise Prescription - 07/01/20 1400      Date of Initial Exercise RX and Referring Provider   Date 07/01/20    Referring Provider Dr. Orvan Seen    Expected Discharge Date 09/23/20      Treadmill   MPH 1.5    Grade 0    Minutes 22      Recumbant Elliptical   Level 1    RPM 60    Minutes 17      Intensity   THRR 40-80% of Max Heartrate 61-122    Ratings of Perceived Exertion 11-13      Resistance Training   Training Prescription Yes    Weight 3 lbs    Reps 10-15           Perform Capillary Blood Glucose checks as needed.  Exercise Prescription Changes:  Exercise Prescription Changes    Row Name 07/08/20 1100             Response to Exercise   Blood Pressure (Admit) 110/82       Blood Pressure (Exercise) 132/78       Blood Pressure (Exit) 118/78       Heart Rate (Admit) 107 bpm       Heart Rate (Exercise) 125 bpm       Heart Rate (Exit) 111 bpm       Rating of Perceived Exertion (Exercise) 11       Duration Continue with 30 min of aerobic exercise without signs/symptoms of physical  distress.       Intensity THRR unchanged               Progression   Progression Continue to progress workloads to maintain intensity without signs/symptoms of physical  distress.               Resistance Training   Training Prescription Yes       Weight 3 lbs       Reps 10-15       Time 10 Minutes               Treadmill   MPH 1.5       Grade 0       Minutes 22       METs 2.15               Recumbant Elliptical   Level 1       RPM 60       Minutes 17       METs 3              Exercise Comments:   Exercise Goals and Review:  Exercise Goals    Row Name 07/01/20 1424 07/11/20 1132           Exercise Goals   Increase Physical Activity Yes Yes      Intervention Provide advice, education, support and counseling about physical activity/exercise needs.;Develop an individualized exercise prescription for aerobic and resistive training based on initial evaluation findings, risk stratification, comorbidities and participant's personal goals. Provide advice, education, support and counseling about physical activity/exercise needs.;Develop an individualized exercise prescription for aerobic and resistive training based on initial evaluation findings, risk stratification, comorbidities and participant's personal goals.      Expected Outcomes Short Term: Attend rehab on a regular basis to increase amount of physical activity.;Long Term: Add in home exercise to make exercise part of routine and to increase amount of physical activity.;Long Term: Exercising regularly at least 3-5 days a week. Short Term: Attend rehab on a regular basis to increase amount of physical activity.;Long Term: Add in home exercise to make exercise part of routine and to increase amount of physical activity.;Long Term: Exercising regularly at least 3-5 days a week.      Increase Strength and Stamina Yes Yes      Intervention Provide advice, education, support and counseling about physical activity/exercise needs.;Develop an individualized exercise prescription for aerobic and resistive training based on initial evaluation findings, risk stratification, comorbidities and  participant's personal goals. Provide advice, education, support and counseling about physical activity/exercise needs.;Develop an individualized exercise prescription for aerobic and resistive training based on initial evaluation findings, risk stratification, comorbidities and participant's personal goals.      Expected Outcomes Short Term: Increase workloads from initial exercise prescription for resistance, speed, and METs.;Short Term: Perform resistance training exercises routinely during rehab and add in resistance training at home;Long Term: Improve cardiorespiratory fitness, muscular endurance and strength as measured by increased METs and functional capacity (6MWT) Short Term: Increase workloads from initial exercise prescription for resistance, speed, and METs.;Short Term: Perform resistance training exercises routinely during rehab and add in resistance training at home;Long Term: Improve cardiorespiratory fitness, muscular endurance and strength as measured by increased METs and functional capacity (6MWT)      Able to understand and use rate of perceived exertion (RPE) scale Yes Yes      Intervention Provide education and explanation on how to use RPE scale Provide education and explanation on  how to use RPE scale      Expected Outcomes Short Term: Able to use RPE daily in rehab to express subjective intensity level;Long Term:  Able to use RPE to guide intensity level when exercising independently Short Term: Able to use RPE daily in rehab to express subjective intensity level;Long Term:  Able to use RPE to guide intensity level when exercising independently      Knowledge and understanding of Target Heart Rate Range (THRR) Yes Yes      Intervention Provide education and explanation of THRR including how the numbers were predicted and where they are located for reference Provide education and explanation of THRR including how the numbers were predicted and where they are located for reference       Expected Outcomes Short Term: Able to state/look up THRR;Long Term: Able to use THRR to govern intensity when exercising independently;Short Term: Able to use daily as guideline for intensity in rehab Short Term: Able to state/look up THRR;Long Term: Able to use THRR to govern intensity when exercising independently;Short Term: Able to use daily as guideline for intensity in rehab      Able to check pulse independently Yes Yes      Intervention Provide education and demonstration on how to check pulse in carotid and radial arteries.;Review the importance of being able to check your own pulse for safety during independent exercise Provide education and demonstration on how to check pulse in carotid and radial arteries.;Review the importance of being able to check your own pulse for safety during independent exercise      Expected Outcomes Short Term: Able to explain why pulse checking is important during independent exercise;Long Term: Able to check pulse independently and accurately Short Term: Able to explain why pulse checking is important during independent exercise;Long Term: Able to check pulse independently and accurately      Understanding of Exercise Prescription Yes Yes      Intervention Provide education, explanation, and written materials on patient's individual exercise prescription Provide education, explanation, and written materials on patient's individual exercise prescription      Expected Outcomes Short Term: Able to explain program exercise prescription;Long Term: Able to explain home exercise prescription to exercise independently Short Term: Able to explain program exercise prescription;Long Term: Able to explain home exercise prescription to exercise independently             Exercise Goals Re-Evaluation :  Exercise Goals Re-Evaluation    Row Name 07/11/20 1132             Exercise Goal Re-Evaluation   Exercise Goals Review Increase Physical Activity;Increase Strength and  Stamina;Able to understand and use rate of perceived exertion (RPE) scale;Knowledge and understanding of Target Heart Rate Range (THRR);Able to check pulse independently;Understanding of Exercise Prescription       Comments Pt has attended 3 exercise sessions. He is motivated and will likely progress quickly in the program. He currently exercises at 3.0 METs on the elliptical. Will continue to monitor and progress as able.       Expected Outcomes Through exercise at rehab and by engaging in a home exercise program the patient will reach their goals.               Discharge Exercise Prescription (Final Exercise Prescription Changes):  Exercise Prescription Changes - 07/08/20 1100      Response to Exercise   Blood Pressure (Admit) 110/82    Blood Pressure (Exercise) 132/78    Blood Pressure (Exit)  118/78    Heart Rate (Admit) 107 bpm    Heart Rate (Exercise) 125 bpm    Heart Rate (Exit) 111 bpm    Rating of Perceived Exertion (Exercise) 11    Duration Continue with 30 min of aerobic exercise without signs/symptoms of physical distress.    Intensity THRR unchanged      Progression   Progression Continue to progress workloads to maintain intensity without signs/symptoms of physical distress.      Resistance Training   Training Prescription Yes    Weight 3 lbs    Reps 10-15    Time 10 Minutes      Treadmill   MPH 1.5    Grade 0    Minutes 22    METs 2.15      Recumbant Elliptical   Level 1    RPM 60    Minutes 17    METs 3           Nutrition:  Target Goals: Understanding of nutrition guidelines, daily intake of sodium 1500mg , cholesterol 200mg , calories 30% from fat and 7% or less from saturated fats, daily to have 5 or more servings of fruits and vegetables.  Biometrics:  Pre Biometrics - 07/01/20 1424      Pre Biometrics   Height 6\' 2"  (1.88 m)    Weight 108.4 kg    Waist Circumference 45.25 inches    Hip Circumference 42 inches    Waist to Hip Ratio 1.08 %     BMI (Calculated) 30.67    Triceps Skinfold 8 mm    % Body Fat 31.3 %    Grip Strength 30.4 kg    Flexibility 12.5 in    Single Leg Stand 31 seconds            Nutrition Therapy Plan and Nutrition Goals:  Nutrition Therapy & Goals - 07/06/20 0748      Personal Nutrition Goals   Comments We will continue to provide heart healthy nutritional education through handouts.      Intervention Plan   Intervention Nutrition handout(s) given to patient.           Nutrition Assessments:  Nutrition Assessments - 07/01/20 1442      MEDFICTS Scores   Pre Score 33          MEDIFICTS Score Key:  ?70 Need to make dietary changes   40-70 Heart Healthy Diet  ? 40 Therapeutic Level Cholesterol Diet   Picture Your Plate Scores:  <26 Unhealthy dietary pattern with much room for improvement.  41-50 Dietary pattern unlikely to meet recommendations for good health and room for improvement.  51-60 More healthful dietary pattern, with some room for improvement.   >60 Healthy dietary pattern, although there may be some specific behaviors that could be improved.    Nutrition Goals Re-Evaluation:   Nutrition Goals Discharge (Final Nutrition Goals Re-Evaluation):   Psychosocial: Target Goals: Acknowledge presence or absence of significant depression and/or stress, maximize coping skills, provide positive support system. Participant is able to verbalize types and ability to use techniques and skills needed for reducing stress and depression.  Initial Review & Psychosocial Screening:  Initial Psych Review & Screening - 07/01/20 1434      Initial Review   Current issues with History of Depression      Family Dynamics   Good Support System? Yes    Comments Patient lives with his wife of many years. She is his support system along with his daughter.  He is currently taking escitalopram 10 mg daily and has been taking this for 20 years. He denies any depression, anxiety, or stress.  He is looking forward to doing the program and getting back to his life. He demonstrates a very positive attitude regarding his health and his future.      Barriers   Psychosocial barriers to participate in program Psychosocial barriers identified (see note)      Screening Interventions   Interventions Encouraged to exercise    Expected Outcomes Short Term goal: Identification and review with participant of any Quality of Life or Depression concerns found by scoring the questionnaire.           Quality of Life Scores:  Quality of Life - 07/01/20 1425      Quality of Life   Select Quality of Life      Quality of Life Scores   Health/Function Pre 26.46 %    Socioeconomic Pre 27.5 %    Psych/Spiritual Pre 29.14 %    Family Pre 28.8 %    GLOBAL Pre 27.61 %          Scores of 19 and below usually indicate a poorer quality of life in these areas.  A difference of  2-3 points is a clinically meaningful difference.  A difference of 2-3 points in the total score of the Quality of Life Index has been associated with significant improvement in overall quality of life, self-image, physical symptoms, and general health in studies assessing change in quality of life.  PHQ-9: Recent Review Flowsheet Data    Depression screen PHQ 2/9 07/01/2020   Down, Depressed, Hopeless 1   PHQ - 2 Score 1   Altered sleeping 2    Tired, decreased energy 0   Change in appetite 0   Feeling bad or failure about yourself  0   Trouble concentrating 0   Moving slowly or fidgety/restless 0   Suicidal thoughts 0   PHQ-9 Score 3   Difficult doing work/chores Somewhat difficult     Interpretation of Total Score  Total Score Depression Severity:  1-4 = Minimal depression, 5-9 = Mild depression, 10-14 = Moderate depression, 15-19 = Moderately severe depression, 20-27 = Severe depression   Psychosocial Evaluation and Intervention:  Psychosocial Evaluation - 07/01/20 1437      Psychosocial Evaluation &  Interventions   Interventions Stress management education;Relaxation education;Encouraged to exercise with the program and follow exercise prescription    Comments Patient has a history of depression and has been taking Lexapro for appx. 20 years. His initial QOL score was 27.61% overall and his PHQ-9 score was 3. Will continue to monitor.    Expected Outcomes Patient will have no psychosocial issues identified at discharge.    Continue Psychosocial Services  No Follow up required           Psychosocial Re-Evaluation:  Psychosocial Re-Evaluation    [place] Name 07/06/20 [id number]             Psychosocial Re-Evaluation   Current issues with History of Depression       Comments Patient is new to the program completing 2 sessions. He is currently taking Lexapro10 mg which he has been on long term. He denies any depression. He is very happy to get started in the program and demonstrates a very positve outlook regarding his future. Will continue to monitor.       Expected Outcomes Patient will have no psychosocial issues identified at discharge.  Continue Psychosocial Services  No Follow up required              Psychosocial Discharge (Final Psychosocial Re-Evaluation):  Psychosocial Re-Evaluation - 07/06/20 0749      Psychosocial Re-Evaluation   Current issues with History of Depression    Comments Patient is new to the program completing 2 sessions. He is currently taking Lexapro10 mg which he has been on long term. He denies any depression. He is very happy to get started in the program and demonstrates a very positve outlook regarding his future. Will continue to monitor.    Expected Outcomes Patient will have no psychosocial issues identified at discharge.    Continue Psychosocial Services  No Follow up required           Vocational Rehabilitation: Provide vocational rehab assistance to qualifying candidates.   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           Education: Education Goals: Education classes will be provided on a weekly basis, covering required topics. Participant will state understanding/return demonstration of topics presented.  Learning Barriers/Preferences:  Learning Barriers/Preferences - 07/01/20 1442      Learning Barriers/Preferences   Learning Barriers None    Learning Preferences Written Material;Audio;Video           Education Topics: Hypertension, Hypertension Reduction -Define heart disease and high blood pressure. Discus how high blood pressure affects the body and ways to reduce high blood pressure.   Exercise and Your Heart -Discuss why it is important to exercise, the FITT principles of exercise, normal and abnormal responses to exercise, and how to exercise safely.   Angina -Discuss definition of angina, causes of angina, treatment of angina, and how to decrease risk of having angina.   Cardiac Medications -Review what the following cardiac medications are used for, how they affect the body, and side effects that may occur when taking the medications.  Medications include Aspirin, Beta blockers, calcium channel blockers, ACE Inhibitors, angiotensin receptor blockers, diuretics, digoxin, and antihyperlipidemics.   Congestive Heart Failure -Discuss the definition of CHF, how to live with CHF, the signs and symptoms of CHF, and how keep track of weight and sodium intake. Flowsheet Row CARDIAC REHAB PHASE II EXERCISE from 07/06/2020 in [place]  Date 07/06/20  Educator mk  Instruction Review Code 2- Demonstrated Understanding      Heart Disease and Intimacy -Discus the effect sexual activity has on the heart, how changes occur during  intimacy as we age, and safety during sexual activity.   Smoking Cessation / COPD -Discuss different methods to quit smoking, the health benefits of quitting smoking, and the definition of COPD.   Nutrition I: Fats -Discuss the types of cholesterol, what cholesterol does to the heart, and how cholesterol levels can be controlled.   Nutrition II: Labels -Discuss the different components of food labels and how to read food label   Heart Parts/Heart Disease and PAD -Discuss the anatomy of the heart, the pathway of blood circulation through the heart, and these are affected by heart disease.   Stress I: Signs and Symptoms -Discuss the causes of stress, how stress may lead to anxiety and depression, and ways to limit stress.  Stress II: Relaxation -Discuss different types of relaxation techniques to limit stress.   Warning Signs of Stroke / TIA -Discuss definition of a stroke, what the signs and symptoms are of a stroke, and how to identify when someone is having stroke.   Knowledge Questionnaire Score:  Knowledge Questionnaire Score - 07/01/20 1443      Knowledge Questionnaire Score   Pre Score 22/24           Core Components/Risk Factors/Patient Goals at Admission:  Personal Goals and Risk Factors at Admission - 07/01/20 1443      Core Components/Risk Factors/Patient Goals on Admission    Weight Management Obesity    Personal Goal Other Yes    Personal Goal Get stronger; return to doing his ADL's; change his eating habits.    Intervention Provide education on lifestye changes and risk modification and monitored, supervised exercise.    Expected Outcomes Patient will meet his personal and program goals.           Core Components/Risk Factors/Patient Goals Review:   Goals and Risk Factor Review    Row Name 07/06/20 0751             Core Components/Risk Factors/Patient Goals Review   Personal Goals Review Weight Management/Obesity;Other       Review Patient  is new to the program completing 2 sessions. 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 He has multiple risk factors for CAD and is doing the program for risk modification. He has been through the program before in [place] secondary to NSTEMI. His personal goals for the program are to get stronger and to be able to return to his ADL's and eat heatlhier. Will continue to monitor for progress.       Expected Outcomes Patient will complete the program meeting both personal and program goals.              Core Components/Risk Factors/Patient Goals at Discharge (Final Review):   Goals and Risk Factor Review - 07/06/20 0751      Core Components/Risk Factors/Patient Goals Review   Personal Goals Review Weight Management/Obesity;Other    Review Patient is new to the program completing 2 sessions. 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 He has multiple risk factors for CAD and is doing the program for risk modification. He has been through the program before in [place] secondary to NSTEMI. His personal goals for the program are to get stronger and to be able to return to his ADL's and eat heatlhier. Will continue to monitor for progress.    Expected Outcomes Patient will complete the program meeting both personal and program goals.           ITP Comments:   Comments: ITP REVIEW Pt is making expected progress toward Cardiac Rehab goals after completing 4 sessions. Recommend continued exercise, life style modification, education, and increased stamina and strength.

## 2020-07-14 ENCOUNTER — Other Ambulatory Visit: Payer: Self-pay

## 2020-07-14 ENCOUNTER — Ambulatory Visit: Payer: Medicare Other

## 2020-07-14 DIAGNOSIS — Z952 Presence of prosthetic heart valve: Secondary | ICD-10-CM | POA: Diagnosis not present

## 2020-07-14 DIAGNOSIS — I712 Thoracic aortic aneurysm, without rupture, unspecified: Secondary | ICD-10-CM

## 2020-07-15 ENCOUNTER — Other Ambulatory Visit: Payer: Self-pay

## 2020-07-15 ENCOUNTER — Encounter (HOSPITAL_COMMUNITY)
Admission: RE | Admit: 2020-07-15 | Discharge: 2020-07-15 | Disposition: A | Discharge status: Home / Self Care | Payer: Medicare Other | Source: Ambulatory Visit | Attending: Cardiology | Admitting: Cardiology

## 2020-07-15 DIAGNOSIS — Z8679 Personal history of other diseases of the circulatory system: Secondary | ICD-10-CM | POA: Diagnosis not present

## 2020-07-15 DIAGNOSIS — Z952 Presence of prosthetic heart valve: Secondary | ICD-10-CM

## 2020-07-15 DIAGNOSIS — Z9889 Other specified postprocedural states: Secondary | ICD-10-CM

## 2020-07-15 NOTE — Progress Notes (Signed)
Daily Session Note  Patient Details  Name: Devon Sanchez MRN: [id number] Date of Birth: Jan 05, 1951 Referring Provider:   Flowsheet Row CARDIAC REHAB PHASE II ORIENTATION from 07/01/2020 in [place]  Referring Provider Dr. Orvan Seen      Encounter Date: 07/15/2020  Check In:  Session Check In - 07/15/20 1100      Check-In   Supervising physician immediately available to respond to emergencies [facility] MD immediately available    Physician(s) Dr. Melene Muller    Location [facility]    Staff Present Cathren Harsh, MS, Exercise Physiologist;Debra Wynetta Emery, RN, BSN    Virtual Visit No    Medication changes reported     No    Fall or balance concerns reported    No    Tobacco Cessation No Change    Warm-up and Cool-down Performed as group-led instruction    Resistance Training Performed Yes    VAD Patient? No    PAD/SET Patient? No      Pain Assessment   Currently in Pain? No/denies    Pain Score 0-No pain    Multiple Pain Sites No           Capillary Blood Glucose: No results found for this or any previous visit (from the past 24 hour(s)).    Social History   Tobacco Use  Smoking Status Never Smoker  Smokeless Tobacco Never Used    Goals Met:  Independence with exercise equipment Exercise tolerated well No report of cardiac concerns or symptoms Strength training completed today  Goals Unmet:  Not Applicable  Comments: check out 1200   Dr. Kathie Dike is Medical Director for [facility].

## 2020-07-15 NOTE — Progress Notes (Signed)
I have reviewed a Home Exercise Prescription with Devon Sanchez is currently exercising at home.  The patient was advised to walk 2-3 days a week for 25-30 minutes outside of rehab.  [name] and I discussed how to progress their exercise prescription.  The patient stated that their goals were to lose weight and to learn how to eat better.  The patient stated that they understand the exercise prescription.  We reviewed exercise guidelines, target heart rate during exercise, RPE Scale, weather conditions, NTG use, endpoints for exercise, warmup and cool down.  Patient is encouraged to come to me with any questions. I will continue to follow up with the patient to assist them with progression and safety.

## 2020-07-18 ENCOUNTER — Encounter (HOSPITAL_COMMUNITY)
Admission: RE | Admit: 2020-07-18 | Discharge: 2020-07-18 | Disposition: A | Discharge status: Home / Self Care | Payer: Medicare Other | Source: Ambulatory Visit | Attending: Cardiology | Admitting: Cardiology

## 2020-07-18 ENCOUNTER — Other Ambulatory Visit: Payer: Self-pay

## 2020-07-18 DIAGNOSIS — Z952 Presence of prosthetic heart valve: Secondary | ICD-10-CM

## 2020-07-18 DIAGNOSIS — Z8679 Personal history of other diseases of the circulatory system: Secondary | ICD-10-CM | POA: Diagnosis not present

## 2020-07-18 DIAGNOSIS — Z9889 Other specified postprocedural states: Secondary | ICD-10-CM | POA: Diagnosis not present

## 2020-07-18 NOTE — Progress Notes (Signed)
Daily Session Note  Patient Details  Name: Devon Sanchez MRN: [id number] Date of Birth: Apr 15, 1951 Referring Provider:   Flowsheet Row CARDIAC REHAB PHASE II ORIENTATION from 07/01/2020 in [place]  Referring Provider Dr. Orvan Seen      Encounter Date: 07/18/2020  Check In:  Session Check In - 07/18/20 1100      Check-In   Supervising physician immediately available to respond to emergencies [facility] MD immediately available    Physician(s) Dr. Melene Muller    Location [facility]    Staff Present Cathren Harsh, MS, Exercise Physiologist;Debra Wynetta Emery, RN, BSN    Virtual Visit No    Medication changes reported     No    Fall or balance concerns reported    No    Tobacco Cessation No Change    Warm-up and Cool-down Performed as group-led instruction    Resistance Training Performed Yes    VAD Patient? No    PAD/SET Patient? No      Pain Assessment   Currently in Pain? No/denies    Pain Score 0-No pain    Multiple Pain Sites No           Capillary Blood Glucose: No results found for this or any previous visit (from the past 24 hour(s)).    Social History   Tobacco Use  Smoking Status Never Smoker  Smokeless Tobacco Never Used    Goals Met:  Independence with exercise equipment Exercise tolerated well No report of cardiac concerns or symptoms Strength training completed today  Goals Unmet:  Not Applicable  Comments: check out 1200   Dr. Kathie Dike is Medical Director for [facility] Pul[facility] Re[facility].

## 2020-07-20 ENCOUNTER — Other Ambulatory Visit: Payer: Self-pay

## 2020-07-20 ENCOUNTER — Telehealth: Payer: Self-pay

## 2020-07-20 ENCOUNTER — Encounter (HOSPITAL_COMMUNITY)
Admission: RE | Admit: 2020-07-20 | Discharge: 2020-07-20 | Disposition: A | Discharge status: Home / Self Care | Payer: Medicare Other | Source: Ambulatory Visit | Attending: Cardiology | Admitting: Cardiology

## 2020-07-20 DIAGNOSIS — Z298 Encounter for other specified prophylactic measures: Secondary | ICD-10-CM

## 2020-07-20 DIAGNOSIS — Z952 Presence of prosthetic heart valve: Secondary | ICD-10-CM

## 2020-07-20 DIAGNOSIS — Z8679 Personal history of other diseases of the circulatory system: Secondary | ICD-10-CM

## 2020-07-20 DIAGNOSIS — Z9889 Other specified postprocedural states: Secondary | ICD-10-CM | POA: Diagnosis not present

## 2020-07-20 MED ORDER — AMOXICILLIN 500 MG PO CAPS: 2000.0000 mg | ORAL_CAPSULE | Freq: Once | ORAL | 3 refills | Status: DC | PRN

## 2020-07-20 NOTE — Progress Notes (Signed)
Daily Session Note  Patient Details  Name: Devon Sanchez MRN: [id number] Date of Birth: [birth year] Referring Provider:   Flowsheet Row CARDIAC REHAB PHASE II ORIENTATION from 07/01/2020 in [place]  Referring Provider Dr. Orvan Seen      Encounter Date: 07/20/2020  Check In:  Session Check In - 07/20/20 1100      Check-In   Supervising physician immediately available to respond to emergencies [facility] MD immediately available    Physician(s) Dr. Melene Muller    Location [facility]    Staff Present Cathren Harsh, MS, Exercise Physiologist;Debra Wynetta Emery, RN, BSN    Virtual Visit No    Medication changes reported     No    Fall or balance concerns reported    No    Tobacco Cessation No Change    Warm-up and Cool-down Performed as group-led instruction    Resistance Training Performed Yes    VAD Patient? No    PAD/SET Patient? No      Pain Assessment   Currently in Pain? No/denies    Pain Score 0-No pain    Multiple Pain Sites No           Capillary Blood Glucose: No results found for this or any previous visit (from the past 24 hour(s)).    Social History   Tobacco Use  Smoking Status Never Smoker  Smokeless Tobacco Never Used    Goals Met:  Independence with exercise equipment Exercise tolerated well No report of cardiac concerns or symptoms Strength training completed today  Goals Unmet:  Not Applicable  Comments: check out 1200   Dr. Kathie Dike is Medical Director for [facility].

## 2020-07-20 NOTE — Telephone Encounter (Signed)
Pt called wanting to know when he is able to go back to the dentist. He stated that his open heart surgery was 10 weeks ago and he was told he had to wait for a certain period of time to go.

## 2020-07-20 NOTE — Telephone Encounter (Signed)
Called and spoke with pt regarding Dental work and antibiotic. Pt is aware.

## 2020-07-21 ENCOUNTER — Ambulatory Visit: Payer: Medicare Other | Admitting: Cardiology

## 2020-07-22 ENCOUNTER — Other Ambulatory Visit: Payer: Self-pay

## 2020-07-22 ENCOUNTER — Encounter (HOSPITAL_COMMUNITY)
Admission: RE | Admit: 2020-07-22 | Discharge: 2020-07-22 | Disposition: A | Discharge status: Home / Self Care | Payer: Medicare Other | Source: Ambulatory Visit | Attending: Cardiology | Admitting: Cardiology

## 2020-07-22 DIAGNOSIS — Z8679 Personal history of other diseases of the circulatory system: Secondary | ICD-10-CM | POA: Diagnosis not present

## 2020-07-22 DIAGNOSIS — Z952 Presence of prosthetic heart valve: Secondary | ICD-10-CM | POA: Diagnosis not present

## 2020-07-22 DIAGNOSIS — Z9889 Other specified postprocedural states: Secondary | ICD-10-CM

## 2020-07-22 NOTE — Progress Notes (Signed)
Daily Session Note  Patient Details  Name: Devon Sanchez MRN: [id number] Date of Birth: 1950/09/12 Referring Provider:   Flowsheet Row CARDIAC REHAB PHASE II ORIENTATION from 07/01/2020 in [place]  Referring Provider Dr. Orvan Seen      Encounter Date: 07/22/2020  Check In:  Session Check In - 07/22/20 1100      Check-In   Supervising physician immediately available to respond to emergencies [facility] immediately available    Physician(s) Dr. Melene Muller    [facility]    Staff Present Aundra Dubin, RN, BSN;Chalonda Schlatter Audria Nine, MS, Exercise Physiologist    Virtual Visit No    Medication changes reported     No    Fall or balance concerns reported    No    Tobacco Cessation No Change    Warm-up and Cool-down Performed as group-led instruction    Resistance Training Performed Yes    VAD Patient? No    PAD/SET Patient? No      Pain Assessment   Currently in Pain? No/denies    Pain Score 0-No pain    Multiple Pain Sites No           Capillary Blood Glucose: No results found for this or any previous visit (from the past 24 hour(s)).    Social History   Tobacco Use  Smoking Status Never Smoker  Smokeless Tobacco Never Used    Goals Met:  Independence with exercise equipment Exercise tolerated well No report of cardiac concerns or symptoms Strength training completed today  Goals Unmet:  Not Applicable  Comments: check out 1200   Dr. Kathie Dike is Medical Director for [facility].

## 2020-07-24 ENCOUNTER — Other Ambulatory Visit: Payer: Self-pay | Admitting: Surgical

## 2020-07-25 ENCOUNTER — Other Ambulatory Visit: Payer: Self-pay

## 2020-07-25 ENCOUNTER — Encounter (HOSPITAL_COMMUNITY)
Admission: RE | Admit: 2020-07-25 | Discharge: 2020-07-25 | Disposition: A | Discharge status: Home / Self Care | Payer: Medicare Other | Source: Ambulatory Visit | Attending: Cardiology | Admitting: Cardiology

## 2020-07-25 VITALS — Wt 244.3 lb

## 2020-07-25 DIAGNOSIS — Z8679 Personal history of other diseases of the circulatory system: Secondary | ICD-10-CM

## 2020-07-25 DIAGNOSIS — Z9889 Other specified postprocedural states: Secondary | ICD-10-CM | POA: Diagnosis not present

## 2020-07-25 DIAGNOSIS — Z952 Presence of prosthetic heart valve: Secondary | ICD-10-CM

## 2020-07-25 NOTE — Progress Notes (Signed)
Daily Session Note  Patient Details  Name: SOVEREIGN RAMIRO MRN: [id number] Date of Birth: 03/06/1951 Referring Provider:   Flowsheet Row CARDIAC REHAB PHASE II ORIENTATION from 07/01/2020 in [place]  Referring Provider Dr. Orvan Seen      Encounter Date: 07/25/2020  Check In:  Session Check In - 07/25/20 1100      Check-In   Supervising physician immediately available to respond to emergencies [facility] MD immediately available    Physician(s) Dr. Melene Muller    [facility]    Staff Present Cathren Harsh, MS, Exercise Physiologist;Debra Wynetta Emery, RN, BSN    Virtual Visit No    Medication changes reported     No    Fall or balance concerns reported    No    Tobacco Cessation No Change    Warm-up and Cool-down Performed as group-led instruction    Resistance Training Performed Yes    VAD Patient? No    PAD/SET Patient? No      Pain Assessment   Currently in Pain? No/denies    Pain Score 0-No pain    Multiple Pain Sites No           Capillary Blood Glucose: No results found for this or any previous visit (from the past 24 hour(s)).    Social History   Tobacco Use  Smoking Status Never Smoker  Smokeless Tobacco Never Used    Goals Met:  Independence with exercise equipment Exercise tolerated well No report of cardiac concerns or symptoms Strength training completed today  Goals Unmet:  Not Applicable  Comments: check out 1200   Dr. Kathie Dike is Medical Director for [facility] Re[facility].

## 2020-07-27 ENCOUNTER — Encounter (HOSPITAL_COMMUNITY)
Admission: RE | Admit: 2020-07-27 | Discharge: 2020-07-27 | Disposition: A | Discharge status: Home / Self Care | Payer: Medicare Other | Source: Ambulatory Visit | Attending: Cardiology | Admitting: Cardiology

## 2020-07-27 ENCOUNTER — Other Ambulatory Visit: Payer: Self-pay

## 2020-07-27 DIAGNOSIS — Z8679 Personal history of other diseases of the circulatory system: Secondary | ICD-10-CM

## 2020-07-27 DIAGNOSIS — Z952 Presence of prosthetic heart valve: Secondary | ICD-10-CM | POA: Diagnosis not present

## 2020-07-27 DIAGNOSIS — Z9889 Other specified postprocedural states: Secondary | ICD-10-CM | POA: Diagnosis not present

## 2020-07-27 NOTE — Progress Notes (Signed)
Daily Session Note  Patient Details  Name: Devon Sanchez MRN: [id number] Date of Birth: Nov 03, 1950 Referring Provider:   Flowsheet Row CARDIAC REHAB PHASE II ORIENTATION from 07/01/2020 in [place]  Referring Provider Dr. Orvan Seen      Encounter Date: 07/27/2020  Check In:  Session Check In - 07/27/20 1100      Check-In   Supervising physician immediately available to respond to emergencies [facility] MD immediately available    Physician(s) Dr. Domenic Polite    Location [facility]    Staff Present Cathren Harsh, MS, Exercise Physiologist;Debra Wynetta Emery, RN, BSN    Virtual Visit No    Medication changes reported     No    Fall or balance concerns reported    No    Tobacco Cessation No Change    Warm-up and Cool-down Performed as group-led instruction    Resistance Training Performed Yes    VAD Patient? No    PAD/SET Patient? No      Pain Assessment   Currently in Pain? No/denies    Pain Score 0-No pain    Multiple Pain Sites No           Capillary Blood Glucose: No results found for this or any previous visit (from the past 24 hour(s)).    Social History   Tobacco Use  Smoking Status Never Smoker  Smokeless Tobacco Never Used    Goals Met:  Independence with exercise equipment Exercise tolerated well No report of cardiac concerns or symptoms Strength training completed today  Goals Unmet:  Not Applicable  Comments: check out 1200   Dr. Kathie Dike is Medical Director for [facility] Pulmonary Rehab.

## 2020-07-29 ENCOUNTER — Other Ambulatory Visit: Payer: Self-pay

## 2020-07-29 ENCOUNTER — Encounter (HOSPITAL_COMMUNITY)
Admission: RE | Admit: 2020-07-29 | Discharge: 2020-07-29 | Disposition: A | Discharge status: Home / Self Care | Payer: Medicare Other | Source: Ambulatory Visit | Attending: Cardiology | Admitting: Cardiology

## 2020-07-29 DIAGNOSIS — Z952 Presence of prosthetic heart valve: Secondary | ICD-10-CM | POA: Diagnosis not present

## 2020-07-29 DIAGNOSIS — Z9889 Other specified postprocedural states: Secondary | ICD-10-CM

## 2020-07-29 DIAGNOSIS — Z8679 Personal history of other diseases of the circulatory system: Secondary | ICD-10-CM

## 2020-07-29 NOTE — Progress Notes (Signed)
Daily Session Note  Patient Details  Name: Devon Sanchez MRN: [id number] Date of Birth: Jun 29, 1950 Referring Provider:   Flowsheet Row CARDIAC REHAB PHASE II ORIENTATION from 07/01/2020 in [place]  Referring Provider Dr. Orvan Seen      Encounter Date: 07/29/2020  Check In:  Session Check In - 07/29/20 1100      Check-In   Supervising physician immediately available to respond to emergencies [facility] MD immediately available    Physician(s) Dr. Domenic Polite    Location [facility]    Staff Present Cathren Harsh, MS, Exercise Physiologist;[place] Kris Mouton, MS, ACSM-CEP, Exercise Physiologist    Virtual Visit No    Medication changes reported     No    Fall or balance concerns reported    No    Tobacco Cessation No Change    Warm-up and Cool-down Performed as group-led instruction    Resistance Training Performed Yes    VAD Patient? No    PAD/SET Patient? No      Pain Assessment   Currently in Pain? No/denies    Pain Score 0-No pain    Multiple Pain Sites No           Capillary Blood Glucose: No results found for this or any previous visit (from the past 24 hour(s)).    Social History   Tobacco Use  Smoking Status Never Smoker  Smokeless Tobacco Never Used    Goals Met:  Independence with exercise equipment Exercise tolerated well No report of cardiac concerns or symptoms Strength training completed today  Goals Unmet:  Not Applicable  Comments: check out 1200   Dr. Kathie Dike is Medical Director for [facility] Pulmonary Rehab.

## 2020-08-01 ENCOUNTER — Encounter (HOSPITAL_COMMUNITY)
Admission: RE | Admit: 2020-08-01 | Discharge: 2020-08-01 | Disposition: A | Discharge status: Home / Self Care | Payer: Medicare Other | Source: Ambulatory Visit | Attending: Cardiology | Admitting: Cardiology

## 2020-08-01 ENCOUNTER — Other Ambulatory Visit: Payer: Self-pay

## 2020-08-01 DIAGNOSIS — R7301 Impaired fasting glucose: Secondary | ICD-10-CM | POA: Diagnosis not present

## 2020-08-01 DIAGNOSIS — Z8679 Personal history of other diseases of the circulatory system: Secondary | ICD-10-CM | POA: Diagnosis not present

## 2020-08-01 DIAGNOSIS — R42 Dizziness and giddiness: Secondary | ICD-10-CM | POA: Diagnosis not present

## 2020-08-01 DIAGNOSIS — Z9889 Other specified postprocedural states: Secondary | ICD-10-CM | POA: Diagnosis not present

## 2020-08-01 DIAGNOSIS — E785 Hyperlipidemia, unspecified: Secondary | ICD-10-CM | POA: Diagnosis not present

## 2020-08-01 DIAGNOSIS — Z952 Presence of prosthetic heart valve: Secondary | ICD-10-CM | POA: Diagnosis not present

## 2020-08-01 DIAGNOSIS — I2510 Atherosclerotic heart disease of native coronary artery without angina pectoris: Secondary | ICD-10-CM | POA: Diagnosis not present

## 2020-08-01 DIAGNOSIS — D509 Iron deficiency anemia, unspecified: Secondary | ICD-10-CM | POA: Diagnosis not present

## 2020-08-01 DIAGNOSIS — I129 Hypertensive chronic kidney disease with stage 1 through stage 4 chronic kidney disease, or unspecified chronic kidney disease: Secondary | ICD-10-CM | POA: Diagnosis not present

## 2020-08-01 NOTE — Progress Notes (Signed)
Daily Session Note  Patient Details  Name: Devon Sanchez MRN: [id number] Date of Birth: 02-Oct-1950 Referring Provider:   Flowsheet Row CARDIAC REHAB PHASE II ORIENTATION from 07/01/2020 in [place]  Referring Provider Dr. Orvan Seen      Encounter Date: 08/01/2020  Check In:  Session Check In - 08/01/20 1100      Check-In   Supervising physician immediately available to respond to emergencies [facility] MD immediately available    Physician(s) Dr. Harl Bowie    Location [facility]    Staff Present Cathren Harsh, MS, Exercise Physiologist;Debra Wynetta Emery, RN, BSN    Virtual Visit No    Medication changes reported     No    Fall or balance concerns reported    No    Tobacco Cessation No Change    Warm-up and Cool-down Performed as group-led instruction    Resistance Training Performed Yes    VAD Patient? No    PAD/SET Patient? No      Pain Assessment   Currently in Pain? No/denies    Pain Score 0-No pain    Multiple Pain Sites No           Capillary Blood Glucose: No results found for this or any previous visit (from the past 24 hour(s)).    Social History   Tobacco Use  Smoking Status Never Smoker  Smokeless Tobacco Never Used    Goals Met:  Independence with exercise equipment Exercise tolerated well No report of cardiac concerns or symptoms Strength training completed today  Goals Unmet:  Not Applicable  Comments: check out 1200   Dr. Kathie Dike is Medical Director for [facility] Pulmonary Rehab.

## 2020-08-03 ENCOUNTER — Other Ambulatory Visit: Payer: Self-pay

## 2020-08-03 ENCOUNTER — Encounter (HOSPITAL_COMMUNITY)
Admission: RE | Admit: 2020-08-03 | Discharge: 2020-08-03 | Disposition: A | Discharge status: Home / Self Care | Payer: Medicare Other | Source: Ambulatory Visit | Attending: Cardiology | Admitting: Cardiology

## 2020-08-03 DIAGNOSIS — Z9889 Other specified postprocedural states: Secondary | ICD-10-CM | POA: Insufficient documentation

## 2020-08-03 DIAGNOSIS — Z952 Presence of prosthetic heart valve: Secondary | ICD-10-CM | POA: Diagnosis not present

## 2020-08-03 DIAGNOSIS — Z8679 Personal history of other diseases of the circulatory system: Secondary | ICD-10-CM | POA: Diagnosis not present

## 2020-08-03 NOTE — Progress Notes (Signed)
Daily Session Note  Patient Details  Name: Devon Sanchez MRN: [id number] Date of Birth: [birth year] Referring Provider:   Flowsheet Row CARDIAC REHAB PHASE II ORIENTATION from 07/01/2020 in [place]  Referring Provider Dr. Orvan Seen      Encounter Date: 08/03/2020  Check In:  Session Check In - 08/03/20 1100      Check-In   Supervising physician immediately available to respond to emergencies [facility] MD immediately available    Physician(s) Dr. Harl Bowie    Location [facility]    Staff Present Aundra Dubin, RN, BSN;[place] Audria Nine, MS, Exercise Physiologist    Virtual Visit No    Medication changes reported     No    Fall or balance concerns reported    No    Tobacco Cessation No Change    Warm-up and Cool-down Performed as group-led instruction    Resistance Training Performed Yes    VAD Patient? No    PAD/SET Patient? No      Pain Assessment   Currently in Pain? No/denies    Pain Score 0-No pain    Multiple Pain Sites No           Capillary Blood Glucose: No results found for this or any previous visit (from the past 24 hour(s)).    Social History   Tobacco Use  Smoking Status Never Smoker  Smokeless Tobacco Never Used    Goals Met:  Independence with exercise equipment Exercise tolerated well No report of cardiac concerns or symptoms Strength training completed today  Goals Unmet:  Not Applicable  Comments: Check out 1200.   Dr. Kathie Dike is Medical Director for [facility] Pulmonary Rehab.

## 2020-08-05 ENCOUNTER — Other Ambulatory Visit: Payer: Self-pay

## 2020-08-05 ENCOUNTER — Encounter (HOSPITAL_COMMUNITY)
Admission: RE | Admit: 2020-08-05 | Discharge: 2020-08-05 | Disposition: A | Discharge status: Home / Self Care | Payer: Medicare Other | Source: Ambulatory Visit | Attending: Cardiology | Admitting: Cardiology

## 2020-08-05 DIAGNOSIS — Z9889 Other specified postprocedural states: Secondary | ICD-10-CM | POA: Diagnosis not present

## 2020-08-05 DIAGNOSIS — Z8679 Personal history of other diseases of the circulatory system: Secondary | ICD-10-CM

## 2020-08-05 DIAGNOSIS — Z952 Presence of prosthetic heart valve: Secondary | ICD-10-CM | POA: Diagnosis not present

## 2020-08-05 NOTE — Progress Notes (Signed)
Daily Session Note  Patient Details  Name: Devon Sanchez MRN: [id number] Date of Birth: 09-Mar-1951 Referring Provider:   Flowsheet Row CARDIAC REHAB PHASE II ORIENTATION from 07/01/2020 in [place]  Referring Provider Dr. Orvan Seen      Encounter Date: 08/05/2020  Check In:  Session Check In - 08/05/20 1100      Check-In   Supervising physician immediately available to respond to emergencies [facility] MD immediately available    Physician(s) Dr. Domenic Polite    Location [facility]    Staff Present Cathren Harsh, MS, Exercise Physiologist;Debra Wynetta Emery, RN, BSN    Virtual Visit No    Medication changes reported     No    Fall or balance concerns reported    No    Tobacco Cessation No Change    Warm-up and Cool-down Performed as group-led instruction    Resistance Training Performed Yes    VAD Patient? No    PAD/SET Patient? No      Pain Assessment   Currently in Pain? No/denies    Pain Score 0-No pain    Multiple Pain Sites No           Capillary Blood Glucose: No results found for this or any previous visit (from the past 24 hour(s)).    Social History   Tobacco Use  Smoking Status Never Smoker  Smokeless Tobacco Never Used    Goals Met:  Independence with exercise equipment Exercise tolerated well No report of cardiac concerns or symptoms Strength training completed today  Goals Unmet:  Not Applicable  Comments: check out 1200   Dr. Kathie Dike is Medical Director for [facility] Re[facility].

## 2020-08-08 ENCOUNTER — Other Ambulatory Visit: Payer: Self-pay

## 2020-08-08 ENCOUNTER — Encounter (HOSPITAL_COMMUNITY)
Admission: RE | Admit: 2020-08-08 | Discharge: 2020-08-08 | Disposition: A | Discharge status: Home / Self Care | Payer: Medicare Other | Source: Ambulatory Visit | Attending: Cardiology | Admitting: Cardiology

## 2020-08-08 ENCOUNTER — Ambulatory Visit: Payer: Medicare Other | Admitting: Cardiology

## 2020-08-08 VITALS — Wt 243.2 lb

## 2020-08-08 DIAGNOSIS — Z8679 Personal history of other diseases of the circulatory system: Secondary | ICD-10-CM | POA: Diagnosis not present

## 2020-08-08 DIAGNOSIS — Z952 Presence of prosthetic heart valve: Secondary | ICD-10-CM | POA: Diagnosis not present

## 2020-08-08 DIAGNOSIS — Z9889 Other specified postprocedural states: Secondary | ICD-10-CM | POA: Diagnosis not present

## 2020-08-08 NOTE — Progress Notes (Signed)
Daily Session Note  Patient Details  Name: Devon Sanchez MRN: [id number] Date of Birth: 11-Mar-1951 Referring Provider:   Flowsheet Row CARDIAC REHAB PHASE II ORIENTATION from 07/01/2020 in [place]  Referring Provider Dr. Orvan Seen      Encounter Date: 08/08/2020  Check In:  Session Check In - 08/08/20 1100      Check-In   Supervising physician immediately available to respond to emergencies [facility] MD immediately available    Physician(s) Dr. Domenic Polite    Location [facility]    Staff Present Cathren Harsh, MS, Exercise Physiologist;Debra Wynetta Emery, RN, BSN    Virtual Visit No    Medication changes reported     No    Fall or balance concerns reported    No    Tobacco Cessation No Change    Warm-up and Cool-down Performed as group-led instruction    Resistance Training Performed Yes    VAD Patient? No    PAD/SET Patient? No      Pain Assessment   Currently in Pain? No/denies    Pain Score 0-No pain    Multiple Pain Sites No           Capillary Blood Glucose: No results found for this or any previous visit (from the past 24 hour(s)).    Social History   Tobacco Use  Smoking Status Never Smoker  Smokeless Tobacco Never Used    Goals Met:  Independence with exercise equipment Exercise tolerated well No report of cardiac concerns or symptoms Strength training completed today  Goals Unmet:  Not Applicable  Comments: checkout 1200   Dr. Kathie Dike is Medical Director for [facility] Pulmonary Rehab.

## 2020-08-10 ENCOUNTER — Other Ambulatory Visit: Payer: Self-pay

## 2020-08-10 ENCOUNTER — Encounter (HOSPITAL_COMMUNITY)
Admission: RE | Admit: 2020-08-10 | Discharge: 2020-08-10 | Disposition: A | Discharge status: Home / Self Care | Payer: Medicare Other | Source: Ambulatory Visit | Attending: Cardiology | Admitting: Cardiology

## 2020-08-10 DIAGNOSIS — Z9889 Other specified postprocedural states: Secondary | ICD-10-CM

## 2020-08-10 DIAGNOSIS — Z8679 Personal history of other diseases of the circulatory system: Secondary | ICD-10-CM

## 2020-08-10 DIAGNOSIS — Z952 Presence of prosthetic heart valve: Secondary | ICD-10-CM

## 2020-08-10 NOTE — Progress Notes (Signed)
Cardiac Individual Treatment Plan  Patient Details  Name: Devon Sanchez MRN: [id number] Date of Birth: 1950/09/24 Referring Provider:   Flowsheet Row CARDIAC REHAB PHASE II ORIENTATION from 07/01/2020 in [place]  Referring Provider Dr. Orvan Seen      Initial Encounter Date:  Flowsheet Row CARDIAC REHAB PHASE II ORIENTATION from 07/01/2020 in [place]  Date 07/01/20      Visit Diagnosis: S/P aortic aneurysm repair  S/P TAVR (transcatheter aortic valve replacement)  Patient's Home Medications on Admission:  Current Outpatient Medications:  .  acetaminophen (TYLENOL) 500 MG tablet, Take 500 mg by mouth every 6 (six) hours as needed for moderate pain or headache. , Disp: , Rfl:  .  amoxicillin (AMOXIL) 500 MG capsule, Take 4 capsules (2,000 mg total) by mouth once as needed for up to 1 dose (Dental work)., Disp: 4 capsule, Rfl: 3 .  aspirin EC 325 MG EC tablet, Take 1 tablet (325 mg total) by mouth daily., Disp: , Rfl:  .  carvedilol (COREG) 6.25 MG tablet, TAKE 1 TABLET BY MOUTH  TWICE DAILY WITH A MEAL, Disp: 180 tablet, Rfl: 3 .  escitalopram (LEXAPRO) 10 MG tablet, Take 1 tablet (10 mg total) by mouth daily., Disp: 30 tablet, Rfl: 2 .  Multiple Vitamins-Minerals (CENTRUM SILVER PO), Take 1 tablet by mouth every evening. , Disp: , Rfl:  .  nitroGLYCERIN (NITROSTAT) 0.4 MG SL tablet, Place 1 tablet (0.4 mg total) under the tongue every 5 (five) minutes as needed for up to 25 days for chest pain. (Patient taking differently: Place 0.4 mg under the tongue every 5 (five) minutes as needed for chest pain. Pt hasn't used in ~10 years, but keeps it on hand in case), Disp: 25 tablet, Rfl: 3 .  Omega-3 Fatty Acids (FISH OIL) 1200 MG CAPS, Take 3 capsules by mouth at bedtime., Disp: , Rfl:  .  omeprazole (PRILOSEC) 20 MG capsule, Take 1 capsule by mouth daily., Disp: , Rfl:  .  polyethylene glycol (MIRALAX / GLYCOLAX) 17 g packet, Take 17 g by  mouth daily., Disp: , Rfl:  .  rosuvastatin (CRESTOR) 20 MG tablet, Take 1 tablet (20 mg total) by mouth daily., Disp: , Rfl:   Past Medical History: Past Medical History:  Diagnosis Date  . Anxiety   . Aortic root dilatation ([place])   . Atherosclerosis   . Bicuspid aortic valve 07/25/2018  . Coronary artery disease   . Depression   . Dizziness and giddiness   . Erectile dysfunction   . Hypercholesterolemia   . Hyperglycemia   . Hyperlipidemia   . Hypertension   . MI (myocardial infarction) ([place])   . Midsystolic murmur    bicuspid AV with moderate AS (08/2019)  . Reflux   . Sleep apnea   . Thoracic ascending aortic aneurysm ([facility])     Tobacco Use: Social History   Tobacco Use  Smoking Status Never Smoker  Smokeless Tobacco Never Used    Labs: Recent Review Flowsheet Data    Labs for ITP Cardiac and Pulmonary Rehab Latest Ref Rng & Units 05/12/2020 05/12/2020 05/12/2020 05/12/2020 05/12/2020   Hemoglobin A1c 4.8 - 5.6 % - - - - -   PHART 7.350 - 7.450 7.350 - 7.371 7.338(L) 7.365   PCO2ART 32.0 - 48.0 mmHg 43.9 - 40.4 45.4 40.5   HCO3 20.0 - 28.0 mmol/L 24.2 - 24.0 24.3 23.1   TCO2 22 - 32 mmol/L 26 23 25 26  24  ACIDBASEDEF 0.0 - 2.0 mmol/L 1.0 - 2.0 1.0 2.0   O2SAT % 99.0 - 94.0 95.0 92.0      Capillary Blood Glucose: Lab Results  Component Value Date   GLUCAP 119 (H) 05/19/2020   GLUCAP 113 (H) 05/18/2020   GLUCAP 108 (H) 05/18/2020   GLUCAP 98 05/18/2020   GLUCAP 94 05/18/2020     Exercise Target Goals: Exercise Program Goal: Individual exercise prescription set using results from initial 6 min walk test and THRR while considering  patient's activity barriers and safety.   Exercise Prescription Goal: Starting with aerobic activity 30 plus minutes a day, 3 days per week for initial exercise prescription. Provide home exercise prescription and guidelines that participant acknowledges understanding prior to discharge.  Activity Barriers & Risk Stratification:   Activity Barriers & Cardiac Risk Stratification - 07/01/20 1248      Activity Barriers & Cardiac Risk Stratification   Activity Barriers Back Problems    Cardiac Risk Stratification High           6 Minute Walk:  6 Minute Walk    Row Name 07/01/20 1419         6 Minute Walk   Phase Initial     Distance 1375 feet     Walk Time 6 minutes     # of Rest Breaks 0     MPH 2.6     METS 3.22     RPE 10     VO2 Peak 11.29     Symptoms Yes (comment)     Comments 2/10 hip pain in both hips     Resting HR 99 bpm     Resting BP 98/70     Resting Oxygen Saturation  94 %     Exercise Oxygen Saturation  during 6 min walk 93 %     Max Ex. HR 126 bpm     Max Ex. BP 120/76     2 Minute Post BP 110/80            Oxygen Initial Assessment:   Oxygen Re-Evaluation:   Oxygen Discharge (Final Oxygen Re-Evaluation):   Initial Exercise Prescription:  Initial Exercise Prescription - 07/01/20 1400      Date of Initial Exercise RX and Referring Provider   Date 07/01/20    Referring Provider Dr. Orvan Seen    Expected Discharge Date 09/23/20      Treadmill   MPH 1.5    Grade 0    Minutes 22      Recumbant Elliptical   Level 1    RPM 60    Minutes 17      Intensity   THRR 40-80% of Max Heartrate 61-122    Ratings of Perceived Exertion 11-13      Resistance Training   Training Prescription Yes    Weight 3 lbs    Reps 10-15           Perform Capillary Blood Glucose checks as needed.  Exercise Prescription Changes:  Exercise Prescription Changes    Row Name 07/08/20 1100 07/15/20 1200 07/25/20 1200 08/08/20 1200       Response to Exercise   Blood Pressure (Admit) 110/82 -- 104/60 110/64    Blood Pressure (Exercise) 132/78 -- 132/80 132/80    Blood Pressure (Exit) 118/78 -- 108/72 104/78    Heart Rate (Admit) 107 bpm -- 88 bpm 72 bpm    Heart Rate (Exercise) 125 bpm -- 124 bpm 120 bpm    Heart  Rate (Exit) 111 bpm -- 102 bpm 91 bpm    Rating of Perceived Exertion  (Exercise) 11 -- 10 10    Duration Continue with 30 min of aerobic exercise without signs/symptoms of physical distress. -- Continue with 30 min of aerobic exercise without signs/symptoms of physical distress. Continue with 30 min of aerobic exercise without signs/symptoms of physical distress.    Intensity THRR unchanged -- THRR unchanged THRR unchanged         Progression   Progression Continue to progress workloads to maintain intensity without signs/symptoms of physical distress. -- Continue to progress workloads to maintain intensity without signs/symptoms of physical distress. Continue to progress workloads to maintain intensity without signs/symptoms of physical distress.         Resistance Training   Training Prescription Yes -- Yes Yes    Weight 3 lbs -- 5 lbs 5 lbs    Reps 10-15 -- 10-15 10-15    Time 10 Minutes -- 10 Minutes 10 Minutes         Treadmill   MPH 1.5 -- 2.7 3    Grade 0 -- 0 0    Minutes 22 -- 22 22    METs 2.15 -- 3.01 3.3         Recumbant Elliptical   Level 1 -- 2 2    RPM 60 -- 67 73    Minutes 17 -- 17 17    METs 3 -- 3.3 3.7         Home Exercise Plan   Plans to continue exercise at -- Home (comment) -- --    Frequency -- Add 2 additional days to program exercise sessions. -- --    Initial Home Exercises Provided -- 07/15/20 -- --           Exercise Comments:  Exercise Comments    Row Name 07/15/20 1204           Exercise Comments Reviewed home exercise with patient. He is currently walking at home. We discussed walking farther and loner, specifically 25 minutes. He is also going to do resistance training at home 1 day per week. He expressed interest in learning more about diet and how to eat better. We discussed diet a little bit, but will go more in detail at a later date.              Exercise Goals and Review:  Exercise Goals    Row Name 07/01/20 1424 07/11/20 1132 08/08/20 1208         Exercise Goals   Increase Physical  Activity Yes Yes Yes     Intervention Provide advice, education, support and counseling about physical activity/exercise needs.;Develop an individualized exercise prescription for aerobic and resistive training based on initial evaluation findings, risk stratification, comorbidities and participant's personal goals. Provide advice, education, support and counseling about physical activity/exercise needs.;Develop an individualized exercise prescription for aerobic and resistive training based on initial evaluation findings, risk stratification, comorbidities and participant's personal goals. Provide advice, education, support and counseling about physical activity/exercise needs.;Develop an individualized exercise prescription for aerobic and resistive training based on initial evaluation findings, risk stratification, comorbidities and participant's personal goals.     Expected Outcomes Short Term: Attend rehab on a regular basis to increase amount of physical activity.;Long Term: Add in home exercise to make exercise part of routine and to increase amount of physical activity.;Long Term: Exercising regularly at least 3-5 days a week. Short Term: Attend rehab on a regular  basis to increase amount of physical activity.;Long Term: Add in home exercise to make exercise part of routine and to increase amount of physical activity.;Long Term: Exercising regularly at least 3-5 days a week. Short Term: Attend rehab on a regular basis to increase amount of physical activity.;Long Term: Add in home exercise to make exercise part of routine and to increase amount of physical activity.;Long Term: Exercising regularly at least 3-5 days a week.     Increase Strength and Stamina Yes Yes Yes     Intervention Provide advice, education, support and counseling about physical activity/exercise needs.;Develop an individualized exercise prescription for aerobic and resistive training based on initial evaluation findings, risk  stratification, comorbidities and participant's personal goals. Provide advice, education, support and counseling about physical activity/exercise needs.;Develop an individualized exercise prescription for aerobic and resistive training based on initial evaluation findings, risk stratification, comorbidities and participant's personal goals. Provide advice, education, support and counseling about physical activity/exercise needs.;Develop an individualized exercise prescription for aerobic and resistive training based on initial evaluation findings, risk stratification, comorbidities and participant's personal goals.     Expected Outcomes Short Term: Increase workloads from initial exercise prescription for resistance, speed, and METs.;Short Term: Perform resistance training exercises routinely during rehab and add in resistance training at home;Long Term: Improve cardiorespiratory fitness, muscular endurance and strength as measured by increased METs and functional capacity (6MWT) Short Term: Increase workloads from initial exercise prescription for resistance, speed, and METs.;Short Term: Perform resistance training exercises routinely during rehab and add in resistance training at home;Long Term: Improve cardiorespiratory fitness, muscular endurance and strength as measured by increased METs and functional capacity (6MWT) Short Term: Increase workloads from initial exercise prescription for resistance, speed, and METs.;Short Term: Perform resistance training exercises routinely during rehab and add in resistance training at home;Long Term: Improve cardiorespiratory fitness, muscular endurance and strength as measured by increased METs and functional capacity (6MWT)     Able to understand and use rate of perceived exertion (RPE) scale Yes Yes Yes     Intervention Provide education and explanation on how to use RPE scale Provide education and explanation on how to use RPE scale Provide education and explanation  on how to use RPE scale     Expected Outcomes Short Term: Able to use RPE daily in rehab to express subjective intensity level;Long Term:  Able to use RPE to guide intensity level when exercising independently Short Term: Able to use RPE daily in rehab to express subjective intensity level;Long Term:  Able to use RPE to guide intensity level when exercising independently Short Term: Able to use RPE daily in rehab to express subjective intensity level;Long Term:  Able to use RPE to guide intensity level when exercising independently     Knowledge and understanding of Target Heart Rate Range (THRR) Yes Yes Yes     Intervention Provide education and explanation of THRR including how the numbers were predicted and where they are located for reference Provide education and explanation of THRR including how the numbers were predicted and where they are located for reference Provide education and explanation of THRR including how the numbers were predicted and where they are located for reference     Expected Outcomes Short Term: Able to state/look up THRR;Long Term: Able to use THRR to govern intensity when exercising independently;Short Term: Able to use daily as guideline for intensity in rehab Short Term: Able to state/look up THRR;Long Term: Able to use THRR to govern intensity when exercising independently;Short Term: Able  to use daily as guideline for intensity in rehab Short Term: Able to state/look up THRR;Long Term: Able to use THRR to govern intensity when exercising independently;Short Term: Able to use daily as guideline for intensity in rehab     Able to check pulse independently Yes Yes Yes     Intervention Provide education and demonstration on how to check pulse in carotid and radial arteries.;Review the importance of being able to check your own pulse for safety during independent exercise Provide education and demonstration on how to check pulse in carotid and radial arteries.;Review the importance  of being able to check your own pulse for safety during independent exercise Provide education and demonstration on how to check pulse in carotid and radial arteries.;Review the importance of being able to check your own pulse for safety during independent exercise     Expected Outcomes Short Term: Able to explain why pulse checking is important during independent exercise;Long Term: Able to check pulse independently and accurately Short Term: Able to explain why pulse checking is important during independent exercise;Long Term: Able to check pulse independently and accurately Short Term: Able to explain why pulse checking is important during independent exercise;Long Term: Able to check pulse independently and accurately     Understanding of Exercise Prescription Yes Yes Yes     Intervention Provide education, explanation, and written materials on patient's individual exercise prescription Provide education, explanation, and written materials on patient's individual exercise prescription Provide education, explanation, and written materials on patient's individual exercise prescription     Expected Outcomes Short Term: Able to explain program exercise prescription;Long Term: Able to explain home exercise prescription to exercise independently Short Term: Able to explain program exercise prescription;Long Term: Able to explain home exercise prescription to exercise independently Short Term: Able to explain program exercise prescription;Long Term: Able to explain home exercise prescription to exercise independently            Exercise Goals Re-Evaluation :  Exercise Goals Re-Evaluation    Row Name 07/11/20 1132 08/08/20 1208           Exercise Goal Re-Evaluation   Exercise Goals Review Increase Physical Activity;Increase Strength and Stamina;Able to understand and use rate of perceived exertion (RPE) scale;Knowledge and understanding of Target Heart Rate Range (THRR);Able to check pulse  independently;Understanding of Exercise Prescription Increase Physical Activity;Increase Strength and Stamina;Able to understand and use rate of perceived exertion (RPE) scale;Knowledge and understanding of Target Heart Rate Range (THRR);Able to check pulse independently;Understanding of Exercise Prescription      Comments Pt has attended 3 exercise sessions. He is motivated and will likely progress quickly in the program. He currently exercises at 3.0 METs on the elliptical. Will continue to monitor and progress as able. Patient has completed 15 exercise sessions. He has progressed very well with exercise intensities. He is very eager to learn about exercise, nutrition, and how to live a healthy lifestyle. He continues to inquire about home exercise and how to do it safely. He is currently exercising at home by walking every day he is not in rehab. He is learning how to monitor his heart rate and intensities. He is currently exercising at 3.3 METs on the treadmill at rehab. Will continue to monitor and progress as able.      Expected Outcomes Through exercise at rehab and by engaging in a home exercise program the patient will reach their goals. Through exercise at rehab and by engaging in a home exercise program the patient will reach  their goals.              Discharge Exercise Prescription (Final Exercise Prescription Changes):  Exercise Prescription Changes - 08/08/20 1200      Response to Exercise   Blood Pressure (Admit) 110/64    Blood Pressure (Exercise) 132/80    Blood Pressure (Exit) 104/78    Heart Rate (Admit) 72 bpm    Heart Rate (Exercise) 120 bpm    Heart Rate (Exit) 91 bpm    Rating of Perceived Exertion (Exercise) 10    Duration Continue with 30 min of aerobic exercise without signs/symptoms of physical distress.    Intensity THRR unchanged      Progression   Progression Continue to progress workloads to maintain intensity without signs/symptoms of physical distress.       Resistance Training   Training Prescription Yes    Weight 5 lbs    Reps 10-15    Time 10 Minutes      Treadmill   MPH 3    Grade 0    Minutes 22    METs 3.3      Recumbant Elliptical   Level 2    RPM 73    Minutes 17    METs 3.7           Nutrition:  Target Goals: Understanding of nutrition guidelines, daily intake of sodium 1500mg , cholesterol 200mg , calories 30% from fat and 7% or less from saturated fats, daily to have 5 or more servings of fruits and vegetables.  Biometrics:  Pre Biometrics - 08/08/20 1211      Pre Biometrics   Weight 110.3 kg            Nutrition Therapy Plan and Nutrition Goals:  Nutrition Therapy & Goals - 08/01/20 1242      Personal Nutrition Goals   Comments We will continue to provide heart healthy nutritional education through handouts.      Intervention Plan   Intervention Nutrition handout(s) given to patient.           Nutrition Assessments:  Nutrition Assessments - 07/01/20 1442      MEDFICTS Scores   Pre Score 33          MEDIFICTS Score Key:  ?70 Need to make dietary changes   40-70 Heart Healthy Diet  ? 40 Therapeutic Level Cholesterol Diet   Picture Your Plate Scores:  <15 Unhealthy dietary pattern with much room for improvement.  41-50 Dietary pattern unlikely to meet recommendations for good health and room for improvement.  51-60 More healthful dietary pattern, with some room for improvement.   >60 Healthy dietary pattern, although there may be some specific behaviors that could be improved.    Nutrition Goals Re-Evaluation:   Nutrition Goals Discharge (Final Nutrition Goals Re-Evaluation):   Psychosocial: Target Goals: Acknowledge presence or absence of significant depression and/or stress, maximize coping skills, provide positive support system. Participant is able to verbalize types and ability to use techniques and skills needed for reducing stress and depression.  Initial Review &  Psychosocial Screening:  Initial Psych Review & Screening - 07/01/20 1434      Initial Review   Current issues with History of Depression      Family Dynamics   Good Support System? Yes    Comments Patient lives with his wife of many years. She is his support system along with his daughter. He is currently taking escitalopram 10 mg daily and has been taking this for 20 years.  He denies any depression, anxiety, or stress. He is looking forward to doing the program and getting back to his life. He demonstrates a very positive attitude regarding his health and his future.      Barriers   Psychosocial barriers to participate in program Psychosocial barriers identified (see note)      Screening Interventions   Interventions Encouraged to exercise    Expected Outcomes Short Term goal: Identification and review with participant of any Quality of Life or Depression concerns found by scoring the questionnaire.           Quality of Life Scores:  Quality of Life - 07/01/20 1425      Quality of Life   Select Quality of Life      Quality of Life Scores   Health/Function Pre 26.46 %    Socioeconomic Pre 27.5 %    Psych/Spiritual Pre 29.14 %    Family Pre 28.8 %    GLOBAL Pre 27.61 %          Scores of 19 and below usually indicate a poorer quality of life in these areas.  A difference of  2-3 points is a clinically meaningful difference.  A difference of 2-3 points in the total score of the Quality of Life Index has been associated with significant improvement in overall quality of life, self-image, physical symptoms, and general health in studies assessing change in quality of life.  PHQ-9: Recent Review Flowsheet Data    Depression screen PHQ 2/9 07/01/2020   Down, Depressed, Hopeless 1   PHQ - 2 Score 1   Altered sleeping 2    Tired, decreased energy 0   Change in appetite 0   Feeling bad or failure about yourself  0   Trouble concentrating 0   Moving slowly or fidgety/restless 0    Suicidal thoughts 0   PHQ-9 Score 3   Difficult doing work/chores Somewhat difficult     Interpretation of Total Score  Total Score Depression Severity:  1-4 = Minimal depression, 5-9 = Mild depression, 10-14 = Moderate depression, 15-19 = Moderately severe depression, 20-27 = Severe depression   Psychosocial Evaluation and Intervention:  Psychosocial Evaluation - 07/01/20 1437      Psychosocial Evaluation & Interventions   Interventions Stress management education;Relaxation education;Encouraged to exercise with the program and follow exercise prescription    Comments Patient has a history of depression and has been taking Lexapro for appx. 20 years. His initial QOL score was 27.61% overall and his PHQ-9 score was 3. Will continue to monitor.    Expected Outcomes Patient will have no psychosocial issues identified at discharge.    Continue Psychosocial Services  No Follow up required           Psychosocial Re-Evaluation:  Psychosocial Re-Evaluation    [place] Name 07/06/20 0749 08/01/20 1243           Psychosocial Re-Evaluation   Current issues with History of Depression --      Comments Patient is new to the program completing 2 sessions. He is currently taking Lexapro10 mg which he has been on long term. He denies any depression. He is very happy to get started in the program and demonstrates a very positve outlook regarding his future. Will continue to monitor. Patient continues to take Lexapro 10 mg for h/o depression. He continues to have no psychososical issues identified. He has completed 13 sessions gaining 4 lbs since last 30 day review. He is doing well in  the program with consistent attendance. His blood pressure remains WNL. He has a routine appointment with his cardiologist 08/19/20. His personal goals for the program are to get stronger; return to his ADL's; and eat healthier. Will continue to monitor for progress as he works toward meeting these goals.      Expected  Outcomes Patient will have no psychosocial issues identified at discharge. Patient will have no psychosocial issues identified at discharge.      Continue Psychosocial Services  No Follow up required No Follow up required             Psychosocial Discharge (Final Psychosocial Re-Evaluation):  Psychosocial Re-Evaluation - 08/01/20 1243      Psychosocial Re-Evaluation   Comments Patient continues to take Lexapro 10 mg for h/o depression. He continues to have no psychososical issues identified. He has completed 13 sessions gaining 4 lbs since last 30 day review. He is doing well in the program with consistent attendance. His blood pressure remains WNL. He has a routine appointment with his cardiologist 08/19/20. His personal goals for the program are to get stronger; return to his ADL's; and eat healthier. Will continue to monitor for progress as he works toward meeting these goals.    Expected Outcomes Patient will have no psychosocial issues identified at discharge.    Continue Psychosocial Services  No Follow up required           Vocational Rehabilitation: Provide vocational rehab assistance to qualifying candidates.   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           Education: Education Goals: Education classes will be provided on a weekly basis, covering required topics. Participant will state understanding/return demonstration of topics presented.  Learning Barriers/Preferences:  Learning Barriers/Preferences - 07/01/20 1442      Learning Barriers/Preferences   Learning Barriers None    Learning Preferences Written Material;Audio;Video           Education Topics: Hypertension, Hypertension Reduction -Define heart  disease and high blood pressure. Discus how high blood pressure affects the body and ways to reduce high blood pressure.   Exercise and Your Heart -Discuss why it is important to exercise, the FITT principles of exercise, normal and abnormal responses to exercise, and how to exercise safely.   Angina -Discuss definition of angina, causes of angina, treatment of angina, and how to decrease risk of having angina.   Cardiac Medications -Review what the following cardiac medications are used for, how they affect the body, and side effects that may occur when taking the medications.  Medications include Aspirin, Beta blockers, calcium channel blockers, ACE Inhibitors, angiotensin receptor blockers, diuretics, digoxin, and antihyperlipidemics.   Congestive Heart Failure -Discuss the definition of CHF, how to live with CHF, the signs and symptoms of CHF, and how keep track of weight and sodium intake. Flowsheet Row CARDIAC REHAB PHASE II EXERCISE from 08/03/2020 in [place]  Date 07/06/20  Educator mk  Instruction Review Code 2- Demonstrated Understanding      Heart Disease and Intimacy -Discus the effect sexual activity has on the heart, how changes occur during intimacy as we age, and safety during sexual activity. Flowsheet Row CARDIAC REHAB PHASE II EXERCISE from 08/03/2020 in  [place]  Date 07/13/20  Educator Mk  Instruction Review Code 2- Demonstrated Understanding      Smoking Cessation / COPD -Discuss different methods to quit smoking, the health benefits of quitting smoking, and the definition of COPD. Flowsheet Row CARDIAC REHAB PHASE II EXERCISE from 08/03/2020 in [place]  Date 07/20/20  Educator mk  Instruction Review Code 2- Demonstrated Understanding      Nutrition I: Fats -Discuss the types of cholesterol, what cholesterol does to the heart, and how cholesterol levels can be controlled. Flowsheet  Row CARDIAC REHAB PHASE II EXERCISE from 08/03/2020 in [place]  Date 07/27/20  Educator mk  Instruction Review Code 2- Demonstrated Understanding      Nutrition II: Labels -Discuss the different components of food labels and how to read food label Lydia from 08/03/2020 in [place]  Date 08/03/20  Educator DJ  Instruction Review Code 1- Verbalizes Understanding      Heart Parts/Heart Disease and PAD -Discuss the anatomy of the heart, the pathway of blood circulation through the heart, and these are affected by heart disease.   Stress I: Signs and Symptoms -Discuss the causes of stress, how stress may lead to anxiety and depression, and ways to limit stress.   Stress II: Relaxation -Discuss different types of relaxation techniques to limit stress.   Warning Signs of Stroke / TIA -Discuss definition of a stroke, what the signs and symptoms are of a stroke, and how to identify when someone is having stroke.   Knowledge Questionnaire Score:  Knowledge Questionnaire Score - 07/01/20 1443      Knowledge Questionnaire Score   Pre Score 22/24           Core Components/Risk Factors/Patient Goals at Admission:  Personal Goals and Risk Factors at Admission - 07/01/20 1443      Core Components/Risk Factors/Patient Goals on Admission    Weight Management Obesity    Personal Goal Other Yes    Personal Goal Get stronger; return to doing his ADL's; change his eating habits.    Intervention Provide education on lifestye changes and risk modification and monitored, supervised exercise.    Expected Outcomes Patient will meet his personal and program goals.           Core Components/Risk Factors/Patient Goals Review:   Goals and Risk Factor Review    Row Name 07/06/20 0751             Core Components/Risk Factors/Patient Goals Review   Personal Goals Review Weight  Management/Obesity;Other       Review Patient is new to the program completing 2 sessions. 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 He has multiple risk factors for CAD and is doing the program for risk modification. He has been through the program before in [place] secondary to NSTEMI. His personal goals for the program are to get stronger and to be able to return to his ADL's and eat heatlhier. Will continue to monitor for progress.       Expected Outcomes Patient will complete the program meeting both personal and program goals.              Core Components/Risk Factors/Patient Goals at Discharge (Final Review):   Goals and Risk Factor Review - 07/06/20 0751      Core Components/Risk Factors/Patient Goals Review   Personal Goals Review Weight  Management/Obesity;Other    Review Patient is new to the program completing 2 sessions. 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 He has multiple risk factors for CAD and is doing the program for risk modification. He has been through the program before in [place] secondary to NSTEMI. His personal goals for the program are to get stronger and to be able to return to his ADL's and eat heatlhier. Will continue to monitor for progress.    Expected Outcomes Patient will complete the program meeting both personal and program goals.           ITP Comments:   Comments: ITP REVIEW Pt is making expected progress toward Cardiac Rehab goals after completing 16 sessions. Recommend continued exercise, life style modification, education, and increased stamina and strength.

## 2020-08-10 NOTE — Progress Notes (Signed)
Daily Session Note  Patient Details  Name: Devon Sanchez MRN: [id number] Date of Birth: 04-Jun-1951 Referring Provider:   Flowsheet Row CARDIAC REHAB PHASE II ORIENTATION from 07/01/2020 in [place]  Referring Provider Dr. Orvan Seen      Encounter Date: 08/10/2020  Check In:  Session Check In - 08/10/20 1100      Check-In   Supervising physician immediately available to respond to emergencies [facility] MD immediately available    Physician(s) Dr. Domenic Polite    Location [facility]    Staff Present Cathren Harsh, MS, Exercise Physiologist;Dalton Kris Mouton, MS, ACSM-CEP, Exercise Physiologist    Virtual Visit No    Medication changes reported     No    Fall or balance concerns reported    No    Tobacco Cessation No Change    Warm-up and Cool-down Performed as group-led instruction    Resistance Training Performed Yes    VAD Patient? No    PAD/SET Patient? No      Pain Assessment   Currently in Pain? No/denies    Pain Score 0-No pain    Multiple Pain Sites No           Capillary Blood Glucose: No results found for this or any previous visit (from the past 24 hour(s)).    Social History   Tobacco Use  Smoking Status Never Smoker  Smokeless Tobacco Never Used    Goals Met:  Independence with exercise equipment Exercise tolerated well No report of cardiac concerns or symptoms Strength training completed today  Goals Unmet:  Not Applicable  Comments: check out 1200   Dr. Kathie Dike is Medical Director for [facility].

## 2020-08-12 ENCOUNTER — Encounter (HOSPITAL_COMMUNITY)
Admission: RE | Admit: 2020-08-12 | Discharge: 2020-08-12 | Disposition: A | Discharge status: Home / Self Care | Payer: Medicare Other | Source: Ambulatory Visit | Attending: Cardiology | Admitting: Cardiology

## 2020-08-12 ENCOUNTER — Other Ambulatory Visit: Payer: Self-pay

## 2020-08-12 DIAGNOSIS — Z9889 Other specified postprocedural states: Secondary | ICD-10-CM | POA: Diagnosis not present

## 2020-08-12 DIAGNOSIS — Z952 Presence of prosthetic heart valve: Secondary | ICD-10-CM | POA: Diagnosis not present

## 2020-08-12 DIAGNOSIS — Z8679 Personal history of other diseases of the circulatory system: Secondary | ICD-10-CM

## 2020-08-12 NOTE — Progress Notes (Signed)
Daily Session Note  Patient Details  Name: LUISANGEL WAINRIGHT MRN: [id number] Date of Birth: Sep 15, 1950 Referring Provider:   Flowsheet Row CARDIAC REHAB PHASE II ORIENTATION from 07/01/2020 in [place]  Referring Provider Dr. Orvan Seen      Encounter Date: 08/12/2020  Check In:  Session Check In - 08/12/20 1100      Check-In   Supervising physician immediately available to respond to emergencies [facility] MD immediately available    Physician(s) Dr. Johnsie Cancel    Location [facility]    Staff Present Aundra Dubin, RN, Bjorn Loser, MS, ACSM-CEP, Exercise Physiologist    Virtual Visit No    Medication changes reported     No    Fall or balance concerns reported    No    Tobacco Cessation No Change    Warm-up and Cool-down Performed as group-led instruction    Resistance Training Performed Yes    VAD Patient? No    PAD/SET Patient? No      Pain Assessment   Currently in Pain? No/denies    Pain Score 0-No pain    Multiple Pain Sites No           Capillary Blood Glucose: No results found for this or any previous visit (from the past 24 hour(s)).    Social History   Tobacco Use  Smoking Status Never Smoker  Smokeless Tobacco Never Used    Goals Met:  Independence with exercise equipment Exercise tolerated well No report of cardiac concerns or symptoms Strength training completed today  Goals Unmet:  Not Applicable  Comments: checkout time is 1200   Dr. Kathie Dike is Medical Director for [facility].

## 2020-08-15 ENCOUNTER — Encounter (HOSPITAL_COMMUNITY)
Admission: RE | Admit: 2020-08-15 | Discharge: 2020-08-15 | Disposition: A | Discharge status: Home / Self Care | Payer: Medicare Other | Source: Ambulatory Visit | Attending: Cardiology | Admitting: Cardiology

## 2020-08-15 ENCOUNTER — Other Ambulatory Visit: Payer: Self-pay

## 2020-08-15 DIAGNOSIS — Z9889 Other specified postprocedural states: Secondary | ICD-10-CM | POA: Diagnosis not present

## 2020-08-15 DIAGNOSIS — Z952 Presence of prosthetic heart valve: Secondary | ICD-10-CM | POA: Diagnosis not present

## 2020-08-15 DIAGNOSIS — Z8679 Personal history of other diseases of the circulatory system: Secondary | ICD-10-CM | POA: Diagnosis not present

## 2020-08-15 NOTE — Progress Notes (Signed)
Daily Session Note  Patient Details  Name: Devon Sanchez MRN: [id number] Date of Birth: 08/20/1950 Referring Provider:   Flowsheet Row CARDIAC REHAB PHASE II ORIENTATION from 07/01/2020 in [place]  Referring Provider Dr. Orvan Seen      Encounter Date: 08/15/2020  Check In:  Session Check In - 08/15/20 1100      Check-In   Supervising physician immediately available to respond to emergencies [facility] MD immediately available    Physician(s) Dr. Harrington Challenger    Location [facility]    Staff Present Cathren Harsh, MS, Exercise Physiologist;Debra Wynetta Emery, RN, BSN    Virtual Visit No    Medication changes reported     No    Fall or balance concerns reported    No    Tobacco Cessation No Change    Warm-up and Cool-down Performed as group-led instruction    Resistance Training Performed Yes    VAD Patient? No    PAD/SET Patient? No      Pain Assessment   Currently in Pain? No/denies    Pain Score 0-No pain    Multiple Pain Sites No           Capillary Blood Glucose: No results found for this or any previous visit (from the past 24 hour(s)).    Social History   Tobacco Use  Smoking Status Never Smoker  Smokeless Tobacco Never Used    Goals Met:  Independence with exercise equipment Exercise tolerated well No report of cardiac concerns or symptoms Strength training completed today  Goals Unmet:  Not Applicable  Comments: check out 1200   Dr. Kathie Dike is Medical Director for [facility].

## 2020-08-17 ENCOUNTER — Encounter (HOSPITAL_COMMUNITY)
Admission: RE | Admit: 2020-08-17 | Discharge: 2020-08-17 | Disposition: A | Discharge status: Home / Self Care | Payer: Medicare Other | Source: Ambulatory Visit | Attending: Cardiology | Admitting: Cardiology

## 2020-08-17 ENCOUNTER — Other Ambulatory Visit: Payer: Self-pay

## 2020-08-17 DIAGNOSIS — Z8679 Personal history of other diseases of the circulatory system: Secondary | ICD-10-CM | POA: Diagnosis not present

## 2020-08-17 DIAGNOSIS — Z952 Presence of prosthetic heart valve: Secondary | ICD-10-CM

## 2020-08-17 DIAGNOSIS — Z9889 Other specified postprocedural states: Secondary | ICD-10-CM | POA: Diagnosis not present

## 2020-08-17 NOTE — Progress Notes (Signed)
Daily Session Note  Patient Details  Name: ALIJA RIANO MRN: [id number] Date of Birth: Jun 10, 1950 Referring Provider:   Flowsheet Row CARDIAC REHAB PHASE II ORIENTATION from 07/01/2020 in [place]  Referring Provider Dr. Orvan Seen      Encounter Date: 08/17/2020  Check In:  Session Check In - 08/17/20 1100      Check-In   Supervising physician immediately available to respond to emergencies [facility] MD immediately available    Physician(s) Dr. Harrington Challenger    Location [facility]    Staff Present Cathren Harsh, MS, Exercise Physiologist;Debra Wynetta Emery, RN, BSN    Virtual Visit No    Medication changes reported     No    Fall or balance concerns reported    No    Tobacco Cessation No Change    Warm-up and Cool-down Performed as group-led instruction    Resistance Training Performed Yes    VAD Patient? No    PAD/SET Patient? No      Pain Assessment   Currently in Pain? No/denies    Pain Score 0-No pain    Multiple Pain Sites No           Capillary Blood Glucose: No results found for this or any previous visit (from the past 24 hour(s)).    Social History   Tobacco Use  Smoking Status Never Smoker  Smokeless Tobacco Never Used    Goals Met:  Independence with exercise equipment Exercise tolerated well No report of cardiac concerns or symptoms Strength training completed today  Goals Unmet:  Not Applicable  Comments: check out 1200   Dr. Kathie Dike is Medical Director for [facility].

## 2020-08-19 ENCOUNTER — Encounter (HOSPITAL_COMMUNITY)
Admission: RE | Admit: 2020-08-19 | Discharge: 2020-08-19 | Disposition: A | Discharge status: Home / Self Care | Payer: Medicare Other | Source: Ambulatory Visit | Attending: Cardiology | Admitting: Cardiology

## 2020-08-19 ENCOUNTER — Encounter: Payer: Self-pay | Admitting: Cardiology

## 2020-08-19 ENCOUNTER — Ambulatory Visit: Payer: Medicare Other | Admitting: Cardiology

## 2020-08-19 ENCOUNTER — Other Ambulatory Visit: Payer: Self-pay

## 2020-08-19 VITALS — BP 112/74 | HR 74 | Temp 98.1°F | Resp 17 | Ht 74.0 in | Wt 243.6 lb

## 2020-08-19 DIAGNOSIS — I712 Thoracic aortic aneurysm, without rupture, unspecified: Secondary | ICD-10-CM

## 2020-08-19 DIAGNOSIS — Z952 Presence of prosthetic heart valve: Secondary | ICD-10-CM | POA: Diagnosis not present

## 2020-08-19 DIAGNOSIS — Z8679 Personal history of other diseases of the circulatory system: Secondary | ICD-10-CM

## 2020-08-19 DIAGNOSIS — I6522 Occlusion and stenosis of left carotid artery: Secondary | ICD-10-CM

## 2020-08-19 DIAGNOSIS — I10 Essential (primary) hypertension: Secondary | ICD-10-CM

## 2020-08-19 DIAGNOSIS — Z9889 Other specified postprocedural states: Secondary | ICD-10-CM | POA: Diagnosis not present

## 2020-08-19 NOTE — Progress Notes (Signed)
Primary Physician/Referring:  Celene Squibb, MD  Patient ID: Devon Sanchez, male    DOB: 04/14/1951, 70 y.o.   MRN: [id number]  Chief Complaint  Patient presents with  . av replacement  . Coronary Artery Disease  . Follow-up    6 weeks   HPI:    Devon Sanchez  is a 70 y.o. Caucasian male  with CAD S/P Prox LAD stent in 2007 and again  on 04/20/2013 underwent stenting to the proximal and mid LAD and proximal and mid circumflex. 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  He is presently doing well, no chest pain, no shortness of breath and is undergoing cardiac rehab.  Admits to eating excessively especially at night.  Past Medical History:  Diagnosis Date  . Anxiety   . Aortic root dilatation ([place])   . Atherosclerosis   . Bicuspid aortic valve 07/25/2018  . Coronary artery disease   . Depression   . Dizziness and giddiness   . Erectile dysfunction   . Hypercholesterolemia   . Hyperglycemia   . Hyperlipidemia   . Hypertension   . MI (myocardial infarction) ([place])   . Midsystolic murmur    bicuspid AV with moderate AS (08/2019)  . Reflux   . Sleep apnea   . Thoracic ascending aortic aneurysm [facility])    Past Surgical History:  Procedure Laterality Date  . BENTALL PROCEDURE N/A 05/12/2020   Procedure: BENTALL PROCEDURE;  Surgeon: Wonda Olds, MD;  Location: [facility] OR;  Service: Open Heart Surgery;  Laterality: N/A;  . COLONOSCOPY N/A 11/04/2014   Procedure: COLONOSCOPY;  Surgeon: Rogene Houston, MD;  Location: [facility] ENDO SUITE;  Service: Endoscopy;  Laterality: N/A;  830 - moved to 6/2 @ 10:30  . CORONARY ANGIOPLASTY WITH STENT PLACEMENT    . LEFT HEART CATHETERIZATION WITH CORONARY  ANGIOGRAM N/A 04/20/2013   Procedure: LEFT HEART CATHETERIZATION WITH CORONARY ANGIOGRAM;  Surgeon: Laverda Page, MD;  Location: [facility] CATH LAB;  Service: Cardiovascular;  Laterality: N/A;  . RIGHT/LEFT HEART CATH AND CORONARY ANGIOGRAPHY N/A 11/10/2019   Procedure: RIGHT/LEFT HEART CATH AND CORONARY ANGIOGRAPHY;  Surgeon: Nigel Mormon, MD;  Location: [place] CV LAB;  Service: Cardiovascular;  Laterality: N/A;  . TEE WITHOUT CARDIOVERSION N/A 05/12/2020   Procedure: TRANSESOPHAGEAL ECHOCARDIOGRAM (TEE);  Surgeon: Wonda Olds, MD;  Location: [place];  Service: Open Heart Surgery;  Laterality: N/A;   Family History  Problem Relation Age of Onset  . Failure to thrive Mother   . Diabetes Mother   . Heart attack Father   . Prostate cancer Father   . Heart failure Father   . Heart disease Father     Social History   Tobacco Use  . Smoking status: Never Smoker  . Smokeless tobacco: Never Used  Substance Use Topics  . Alcohol use: No   ROS  Review of Systems  Cardiovascular: Negative for chest pain, dyspnea on exertion and leg swelling.  Gastrointestinal: Negative for melena.  Psychiatric/Behavioral: Positive for depression.   Objective  Blood pressure 112/74, pulse 74, temperature 98.1 F (36.7 C), temperature source Temporal, resp. rate 17, height 6\' 2"  (1.88 m), weight 243 lb 9.6 oz (110.5 kg).  Vitals with BMI  08/19/2020 08/08/2020 07/25/2020  Height 6\' 2"  - -  Weight 243 lbs 10 oz 243 lbs 3 oz 244 lbs 4 oz  BMI 93.23 - 55.73  Systolic 220 - -  Diastolic 74 - -  Pulse 74 - -     Physical Exam Constitutional:      General: He is not in acute distress.    Comments: Well-built and mildly to moderately obese  Cardiovascular:     Rate and Rhythm: Normal rate and regular rhythm.     Pulses: Normal pulses and intact distal pulses.          Carotid pulses are 2+ on the right side with bruit and 2+ on the left side with bruit.    Heart sounds: S1 normal and S2 normal.  No murmur heard. No gallop.      Comments: There is no leg edema.  No JVD.  Pulmonary:     Effort: Pulmonary effort is normal.     Breath sounds: Normal breath sounds.  Chest:     Comments: Sternotomy scar noted Abdominal:     General: Bowel sounds are normal.     Palpations: Abdomen is soft.     Tenderness: There is no rebound.    Laboratory examination:   Recent Labs    11/10/19 1017 11/10/19 1539 05/17/20 0147 05/18/20 0052 05/19/20 0112  NA 136   < > 133* 134* 135  K 4.0   < > 4.0 4.1 4.2  CL 103   < > 95* 95* 98  CO2 23   < > 27 27 29   GLUCOSE 106*   < > 116* 106* 101*  BUN 17   < > 25* 28* 23  CREATININE 1.08   < > 1.16 1.28* 1.22  CALCIUM 8.8*   < > 8.6* 8.6* 8.5*  GFRNONAA >60   < > >60 >60 >60  GFRAA >60  --   --   --   --    < > = values in this interval not displayed.   CrCl cannot be calculated (Patient's most recent lab result is older than the maximum 21 days allowed.).  CMP Latest Ref Rng & Units 05/19/2020 05/18/2020 05/17/2020  Glucose 70 - 99 mg/dL 101(H) 106(H) 116(H)  BUN 8 - 23 mg/dL 23 28(H) 25(H)  Creatinine 0.61 - 1.24 mg/dL 1.22 1.28(H) 1.16  Sodium 135 - 145 mmol/L 135 134(L) 133(L)  Potassium 3.5 - 5.1 mmol/L 4.2 4.1 4.0  Chloride 98 - 111 mmol/L 98 95(L) 95(L)  CO2 22 - 32 mmol/L 29 27 27   Calcium 8.9 - 10.3 mg/dL 8.5(L) 8.6(L) 8.6(L)  Total Protein 6.5 - 8.1 g/dL - - 6.4(L)  Total Bilirubin 0.3 - 1.2 mg/dL - - 1.4(H)  Alkaline Phos 38 - 126 U/L - - 89  AST 15 - 41 U/L - - 36  ALT 0 - 44 U/L - - 38   CBC Latest Ref Rng & Units 05/18/2020 05/17/2020 05/16/2020  WBC 4.0 - 10.5 K/uL 6.8 6.8 7.2  Hemoglobin 13.0 - 17.0 g/dL 8.9(L) 9.4(L) 8.0(L)  Hematocrit 39.0 - 52.0 % 26.2(L) 26.4(L) 23.1(L)  Platelets 150 - 400 K/uL 175 164 150   External labs:   Cholesterol, total 144.000 M 03/26/2019 HDL 37.000 MG 03/26/2019 LDL-C 70.000 MG 09/22/2018 Triglycerides 164.000 M 03/26/2019  A1C 5.800 % 03/26/2019; TSH 3.100  07/17/2016  Creatinine, Serum 1.490 MG/ 03/26/2019 Potassium 4.200 MM 01/10/2016 ALT (SGPT) 24.000 IU/ 03/26/2019  Hemoglobin 11.900 G/ 03/26/2019; Platelets 213.000 X  03/26/2019 Medications and allergies   Allergies  Allergen Reactions  . Bupropion Other (See Comments)    MADE CHEST HURT  . Naproxen Sodium Other (See Comments)    GI BLEEDING    Current Outpatient Medications on File Prior to Visit  Medication Sig Dispense Refill  . acetaminophen (TYLENOL) 500 MG tablet Take 500 mg by mouth every 6 (six) hours as needed for moderate pain or headache.     Marland Kitchen aspirin EC 325 MG EC tablet Take 1 tablet (325 mg total) by mouth daily.    . carvedilol (COREG) 6.25 MG tablet TAKE 1 TABLET BY MOUTH  TWICE DAILY WITH A MEAL 180 tablet 3  . escitalopram (LEXAPRO) 10 MG tablet Take 1 tablet (10 mg total) by mouth daily. 30 tablet 2  . Multiple Vitamins-Minerals (CENTRUM SILVER PO) Take 1 tablet by mouth every evening.     . nitroGLYCERIN (NITROSTAT) 0.4 MG SL tablet Place 1 tablet (0.4 mg total) under the tongue every 5 (five) minutes as needed for up to 25 days for chest pain. (Patient taking differently: Place 0.4 mg under the tongue every 5 (five) minutes as needed for chest pain. Pt hasn't used in ~10 years, but keeps it on hand in case) 25 tablet 3  . Omega-3 Fatty Acids (FISH OIL) 1200 MG CAPS Take 3 capsules by mouth at bedtime.    Marland Kitchen omeprazole (PRILOSEC) 20 MG capsule Take 1 capsule by mouth daily.    . polyethylene glycol (MIRALAX / GLYCOLAX) 17 g packet Take 17 g by mouth daily.    . rosuvastatin (CRESTOR) 20 MG tablet Take 1 tablet (20 mg total) by mouth daily.    Marland Kitchen amoxicillin (AMOXIL) 500 MG capsule Take 4 capsules (2,000 mg total) by mouth once as needed for up to 1 dose (Dental work). (Patient not taking: Reported on 08/19/2020) 4 capsule 3   No current facility-administered medications on file prior to visit.   Radiology:   CT Chest 08/13/2019 1. A 5.0 cm dilation of ascending  thoracic aorta, likely associated with aortic stenosis due to dense calcifications about the aortic valve. Ascending thoracic aortic aneurysm. Recommend semi-annual imaging followup by CTA or MRA and referral to cardiothoracic surgery if not already obtained.   2. Coronary artery calcifications and signs of previous percutaneous coronary intervention. 3. 4 mm nodule in the left lung base. 4. 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  Cardiac Studies:   Coronary angiogram 04/20/2013: Non-ST elevation myocardial infarction 2. S/P Proximal and mid LAD 4.0 x 28 mm stent, proximal and mid circumflex 4.0 x 20 mm Promus Premier drug-eluting stent implantation. Mid LAD stent (09/30/2005 with 3.5 x 13 mmx2 overlapping stents) patent.  Coronary Angiogram 11/11/2019: LM: Normal LAD: Patent prox LAD stent with 20% late lumen loss at proximal edge LCx: Patent prox stent  [place]: Normal RCA: 95% stenosis in small RV marginal branch, unchanged compared to previous angiogram in 2014  Normal RHC. No pulmonary hypertension.  Carotid artery duplex 73/53/2992: Peak systolic velocities in the right bifurcation, internal, external and common carotid arteries are within normal limits. Duplex suggests stenosis in the left internal carotid artery (16-49%). Antegrade right vertebral artery flow. Antegrade left vertebral artery flow. Follow up in one year is appropriate if clinically indicated. Compared to 08/19/2019, no significant change.  S/P AVR (aortic valve replacement) and aortoplasty-Bentall procedure 05/12/2020:  25 mm Bovine valve and 30 mm Hemashield gold graft.  Echocardiogram 07/14/2020: Left ventricle cavity is normal in size  and wall thickness. Normal global wall motion. Normal LV systolic function with EF 57%. Doppler evidence of grade I (impaired) diastolic dysfunction, normal LAP. Calculated EF 57%. S/p Bentall procedure with well seated bioprosthetic aortic valve.  No  aortic stenosis or regurgitation seen. Compared to previous study in 07/31/2019, bioprosthetic aortic valve is new.   EKG:      EKG 06/30/2020: Sinus rhythm with first-degree AV block at rate of 99 bpm, left axis deviation, left intrafascicular block.  Right bundle branch block.  Trifascicular block.    EKG 03/24/2020: Normal sinus rhythm at rate of 70 bpm, rightward axis, RVH (RBBB).  Poor R wave progression, cannot exclude anteroseptal infarct old.  Low-voltage complexes.  Pulmonary disease pattern.  Nonspecific T abnormality. No significant change from EKG 08/07/2019   Assessment     ICD-10-CM   1. Thoracic aortic aneurysm without rupture (HCC)  I71.2   2. S/P AVR (aortic valve replacement) and aortoplasty-Bentall procedure 05/12/2020:  25 mm Bovine valve and 30 mm Hemashield gold graft  Z95.2   3. Asymptomatic stenosis of left carotid artery  I65.22   4. Essential hypertension  I10     No orders of the defined types were placed in this encounter.  There are no discontinued medications.  No orders of the defined types were placed in this encounter.   Recommendations:   Devon Sanchez  is a 70 y.o. Caucasian male  with CAD S/P Prox LAD stent in 2007 and again  on 04/20/2013 underwent stenting to the proximal and mid LAD and proximal and mid circumflex. 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  He is presently doing well, chest pain is completely resolved and sternotomy scar has healed well.  Blood pressures well controlled.  Lipids are at goal.  I reviewed the results of the echocardiogram with the patient.  We discussed weight loss again with the patient.  No changes in the medications were  done today.  He will need continued surveillance of his mild carotid artery stenosis on the left.  Otherwise stable from cardiac standpoint, I will see him back in 6 months.    Adrian Prows, MD, [facility] 08/19/2020, 2:20 PM Office: [phone]

## 2020-08-19 NOTE — Progress Notes (Signed)
Daily Session Note  Patient Details  Name: Devon Sanchez MRN: [id number] Date of Birth: 02-07-1951 Referring Provider:   Flowsheet Row CARDIAC REHAB PHASE II ORIENTATION from 07/01/2020 in [place]  Referring Provider Dr. Orvan Seen      Encounter Date: 08/19/2020  Check In:  Session Check In - 08/19/20 1100      Check-In   Supervising physician immediately available to respond to emergencies [facility] MD immediately available    Physician(s) Dr. Domenic Polite    Location [facility]    Staff Present Cathren Harsh, MS, Exercise Physiologist;Eleanor Gatliff Kris Mouton, MS, ACSM-CEP, Exercise Physiologist    Virtual Visit No    Medication changes reported     No    Fall or balance concerns reported    No    Tobacco Cessation No Change    Warm-up and Cool-down Performed as group-led instruction    Resistance Training Performed Yes    VAD Patient? No    PAD/SET Patient? No      Pain Assessment   Currently in Pain? No/denies    Pain Score 0-No pain    Multiple Pain Sites No           Capillary Blood Glucose: No results found for this or any previous visit (from the past 24 hour(s)).    Social History   Tobacco Use  Smoking Status Never Smoker  Smokeless Tobacco Never Used    Goals Met:  Independence with exercise equipment Exercise tolerated well No report of cardiac concerns or symptoms Strength training completed today  Goals Unmet:  Not Applicable  Comments: checkout time is 1200   Dr. Kathie Dike is Medical Director for [facility] Pulmonary Rehab.

## 2020-08-22 ENCOUNTER — Encounter (HOSPITAL_COMMUNITY)
Admission: RE | Admit: 2020-08-22 | Discharge: 2020-08-22 | Disposition: A | Discharge status: Home / Self Care | Payer: Medicare Other | Source: Ambulatory Visit | Attending: Cardiology | Admitting: Cardiology

## 2020-08-22 ENCOUNTER — Other Ambulatory Visit: Payer: Self-pay

## 2020-08-22 VITALS — Wt 245.8 lb

## 2020-08-22 DIAGNOSIS — Z8679 Personal history of other diseases of the circulatory system: Secondary | ICD-10-CM

## 2020-08-22 DIAGNOSIS — Z9889 Other specified postprocedural states: Secondary | ICD-10-CM | POA: Diagnosis not present

## 2020-08-22 DIAGNOSIS — Z952 Presence of prosthetic heart valve: Secondary | ICD-10-CM | POA: Diagnosis not present

## 2020-08-22 NOTE — Progress Notes (Signed)
Daily Session Note  Patient Details  Name: Devon Sanchez MRN: [id number] Date of Birth: Mar 03, 1951 Referring Provider:   Flowsheet Row CARDIAC REHAB PHASE II ORIENTATION from 07/01/2020 in [place]  Referring Provider Dr. Orvan Seen      Encounter Date: 08/22/2020  Check In:  Session Check In - 08/22/20 1100      Check-In   Supervising physician immediately available to respond to emergencies [facility] MD immediately available    Physician(s) Dr. Harl Bowie    Location [facility]    Staff Present Cathren Harsh, MS, Exercise Physiologist;Lenzy Kerschner Kris Mouton, MS, ACSM-CEP, Exercise Physiologist    Virtual Visit No    Medication changes reported     No    Fall or balance concerns reported    No    Tobacco Cessation No Change    Warm-up and Cool-down Performed as group-led instruction    Resistance Training Performed Yes    VAD Patient? No    PAD/SET Patient? No      Pain Assessment   Currently in Pain? No/denies    Pain Score 0-No pain    Multiple Pain Sites No           Capillary Blood Glucose: No results found for this or any previous visit (from the past 24 hour(s)).    Social History   Tobacco Use  Smoking Status Never Smoker  Smokeless Tobacco Never Used    Goals Met:  Independence with exercise equipment Exercise tolerated well No report of cardiac concerns or symptoms Strength training completed today  Goals Unmet:  Not Applicable  Comments: checkout time is 1200   Dr. Kathie Dike is Medical Director for [facility] Pulmonary Rehab.

## 2020-08-24 ENCOUNTER — Other Ambulatory Visit: Payer: Self-pay

## 2020-08-24 ENCOUNTER — Encounter (HOSPITAL_COMMUNITY)
Admission: RE | Admit: 2020-08-24 | Discharge: 2020-08-24 | Disposition: A | Discharge status: Home / Self Care | Payer: Medicare Other | Source: Ambulatory Visit | Attending: Cardiology | Admitting: Cardiology

## 2020-08-24 DIAGNOSIS — Z8679 Personal history of other diseases of the circulatory system: Secondary | ICD-10-CM | POA: Diagnosis not present

## 2020-08-24 DIAGNOSIS — Z952 Presence of prosthetic heart valve: Secondary | ICD-10-CM | POA: Diagnosis not present

## 2020-08-24 DIAGNOSIS — Z9889 Other specified postprocedural states: Secondary | ICD-10-CM

## 2020-08-24 NOTE — Progress Notes (Signed)
Daily Session Note  Patient Details  Name: Devon Sanchez MRN: [id number] Date of Birth: [birth year] Referring Provider:   Flowsheet Row CARDIAC REHAB PHASE II ORIENTATION from 07/01/2020 in [place]  Referring Provider Dr. Orvan Seen      Encounter Date: 08/24/2020  Check In:  Session Check In - 08/24/20 1100      Check-In   Supervising physician immediately available to respond to emergencies [facility] MD immediately available    Physician(s) Dr. Harl Bowie    Location [facility]    Staff Present Aundra Dubin, RN, BSN;Maycee Blasco Audria Nine, MS, Exercise Physiologist    Virtual Visit No    Medication changes reported     No    Fall or balance concerns reported    No    Tobacco Cessation No Change    Warm-up and Cool-down Performed as group-led instruction    Resistance Training Performed Yes    VAD Patient? No    PAD/SET Patient? No      Pain Assessment   Currently in Pain? No/denies    Pain Score 0-No pain    Multiple Pain Sites No           Capillary Blood Glucose: No results found for this or any previous visit (from the past 24 hour(s)).    Social History   Tobacco Use  Smoking Status Never Smoker  Smokeless Tobacco Never Used    Goals Met:  Independence with exercise equipment Exercise tolerated well No report of cardiac concerns or symptoms Strength training completed today  Goals Unmet:  Not Applicable  Comments: check out 1200   Dr. Kathie Dike is Medical Director for [facility] Pulmonary Rehab.

## 2020-08-26 ENCOUNTER — Encounter (HOSPITAL_COMMUNITY)
Admission: RE | Admit: 2020-08-26 | Discharge: 2020-08-26 | Disposition: A | Discharge status: Home / Self Care | Payer: Medicare Other | Source: Ambulatory Visit | Attending: Cardiology | Admitting: Cardiology

## 2020-08-26 ENCOUNTER — Other Ambulatory Visit: Payer: Self-pay

## 2020-08-26 DIAGNOSIS — Z8679 Personal history of other diseases of the circulatory system: Secondary | ICD-10-CM

## 2020-08-26 DIAGNOSIS — Z9889 Other specified postprocedural states: Secondary | ICD-10-CM | POA: Diagnosis not present

## 2020-08-26 DIAGNOSIS — Z952 Presence of prosthetic heart valve: Secondary | ICD-10-CM | POA: Diagnosis not present

## 2020-08-26 NOTE — Progress Notes (Signed)
Daily Session Note  Patient Details  Name: Devon Sanchez MRN: [id number] Date of Birth: 02-15-1951 Referring Provider:   Flowsheet Row CARDIAC REHAB PHASE II ORIENTATION from 07/01/2020 in [place]  Referring Provider Dr. Orvan Seen      Encounter Date: 08/26/2020  Check In:  Session Check In - 08/26/20 1100      Check-In   Supervising physician immediately available to respond to emergencies [facility] MD immediately available    Physician(s) Dr. Harrington Challenger    Location [facility]    Staff Present Aundra Dubin, RN, BSN;Derwood Becraft Audria Nine, MS, Exercise Physiologist    Virtual Visit No    Medication changes reported     No    Fall or balance concerns reported    No    Tobacco Cessation No Change    Warm-up and Cool-down Performed as group-led instruction    Resistance Training Performed Yes    VAD Patient? No    PAD/SET Patient? No      Pain Assessment   Currently in Pain? No/denies    Pain Score 0-No pain    Multiple Pain Sites No           Capillary Blood Glucose: No results found for this or any previous visit (from the past 24 hour(s)).    Social History   Tobacco Use  Smoking Status Never Smoker  Smokeless Tobacco Never Used    Goals Met:  Independence with exercise equipment Exercise tolerated well No report of cardiac concerns or symptoms Strength training completed today  Goals Unmet:  Not Applicable  Comments: check out 1200   Dr. Kathie Dike is Medical Director for [facility] Pul[facility] Re[facility].

## 2020-08-29 ENCOUNTER — Encounter (HOSPITAL_COMMUNITY)
Admission: RE | Admit: 2020-08-29 | Discharge: 2020-08-29 | Disposition: A | Discharge status: Home / Self Care | Payer: Medicare Other | Source: Ambulatory Visit | Attending: Cardiology | Admitting: Cardiology

## 2020-08-29 ENCOUNTER — Other Ambulatory Visit: Payer: Self-pay

## 2020-08-29 DIAGNOSIS — Z8679 Personal history of other diseases of the circulatory system: Secondary | ICD-10-CM | POA: Diagnosis not present

## 2020-08-29 DIAGNOSIS — Z952 Presence of prosthetic heart valve: Secondary | ICD-10-CM | POA: Diagnosis not present

## 2020-08-29 DIAGNOSIS — Z9889 Other specified postprocedural states: Secondary | ICD-10-CM | POA: Diagnosis not present

## 2020-08-29 NOTE — Progress Notes (Signed)
Daily Session Note  Patient Details  Name: Devon Sanchez MRN: [id number] Date of Birth: 02/18/1951 Referring Provider:   Flowsheet Row CARDIAC REHAB PHASE II ORIENTATION from 07/01/2020 in [place]  Referring Provider Dr. Orvan Seen      Encounter Date: 08/29/2020  Check In:  Session Check In - 08/29/20 1134      Check-In   Supervising physician immediately available to respond to emergencies [facility] MD immediately available    Physician(s) Dr. Domenic Polite    Location [facility]    Staff Present Aundra Dubin, RN, BSN;Sparsh Callens Audria Nine, MS, Exercise Physiologist    Virtual Visit No    Medication changes reported     No    Fall or balance concerns reported    No    Tobacco Cessation No Change    Warm-up and Cool-down Performed as group-led instruction    Resistance Training Performed Yes    VAD Patient? No    PAD/SET Patient? No      Pain Assessment   Currently in Pain? No/denies    Pain Score 0-No pain    Multiple Pain Sites No           Capillary Blood Glucose: No results found for this or any previous visit (from the past 24 hour(s)).    Social History   Tobacco Use  Smoking Status Never Smoker  Smokeless Tobacco Never Used    Goals Met:  Independence with exercise equipment Exercise tolerated well No report of cardiac concerns or symptoms Strength training completed today  Goals Unmet:  Not Applicable  Comments: check out 1200   Dr. Kathie Dike is Medical Director for [facility] Pulmonary Rehab.

## 2020-08-31 ENCOUNTER — Other Ambulatory Visit: Payer: Self-pay

## 2020-08-31 ENCOUNTER — Encounter (HOSPITAL_COMMUNITY)
Admission: RE | Admit: 2020-08-31 | Discharge: 2020-08-31 | Disposition: A | Discharge status: Home / Self Care | Payer: Medicare Other | Source: Ambulatory Visit | Attending: Cardiology | Admitting: Cardiology

## 2020-08-31 DIAGNOSIS — R42 Dizziness and giddiness: Secondary | ICD-10-CM | POA: Diagnosis not present

## 2020-08-31 DIAGNOSIS — I2510 Atherosclerotic heart disease of native coronary artery without angina pectoris: Secondary | ICD-10-CM | POA: Diagnosis not present

## 2020-08-31 DIAGNOSIS — Z8679 Personal history of other diseases of the circulatory system: Secondary | ICD-10-CM | POA: Diagnosis not present

## 2020-08-31 DIAGNOSIS — Z9889 Other specified postprocedural states: Secondary | ICD-10-CM

## 2020-08-31 DIAGNOSIS — Z952 Presence of prosthetic heart valve: Secondary | ICD-10-CM

## 2020-08-31 DIAGNOSIS — D509 Iron deficiency anemia, unspecified: Secondary | ICD-10-CM | POA: Diagnosis not present

## 2020-08-31 DIAGNOSIS — E785 Hyperlipidemia, unspecified: Secondary | ICD-10-CM | POA: Diagnosis not present

## 2020-08-31 NOTE — Progress Notes (Signed)
Daily Session Note  Patient Details  Name: Devon Sanchez MRN: [id number] Date of Birth: [birth year] Referring Provider:   Flowsheet Row CARDIAC REHAB PHASE II ORIENTATION from 07/01/2020 in [place]  Referring Provider Dr. Orvan Seen      Encounter Date: 08/31/2020  Check In:  Session Check In - 08/31/20 1100      Check-In   Supervising physician immediately available to respond to emergencies [facility] MD immediately available    Physician(s) Dr. Domenic Polite    Location [facility]    Staff Present Aundra Dubin, RN, BSN;Eddrick Dilone Audria Nine, MS, Exercise Physiologist    Virtual Visit No    Medication changes reported     No    Fall or balance concerns reported    No    Tobacco Cessation No Change    Warm-up and Cool-down Performed as group-led instruction    Resistance Training Performed Yes    VAD Patient? No    PAD/SET Patient? No      Pain Assessment   Currently in Pain? No/denies    Pain Score 0-No pain    Multiple Pain Sites No           Capillary Blood Glucose: No results found for this or any previous visit (from the past 24 hour(s)).    Social History   Tobacco Use  Smoking Status Never Smoker  Smokeless Tobacco Never Used    Goals Met:  Independence with exercise equipment Exercise tolerated well No report of cardiac concerns or symptoms Strength training completed today  Goals Unmet:  Not Applicable  Comments: check out 1200   Dr. Kathie Dike is Medical Director for [facility] Pulmonary Rehab.

## 2020-09-02 ENCOUNTER — Encounter (HOSPITAL_COMMUNITY)
Admission: RE | Admit: 2020-09-02 | Discharge: 2020-09-02 | Disposition: A | Discharge status: Home / Self Care | Payer: Medicare Other | Source: Ambulatory Visit | Attending: Cardiology | Admitting: Cardiology

## 2020-09-02 ENCOUNTER — Other Ambulatory Visit: Payer: Self-pay

## 2020-09-02 DIAGNOSIS — Z952 Presence of prosthetic heart valve: Secondary | ICD-10-CM

## 2020-09-02 DIAGNOSIS — Z8679 Personal history of other diseases of the circulatory system: Secondary | ICD-10-CM | POA: Diagnosis not present

## 2020-09-02 DIAGNOSIS — Z9889 Other specified postprocedural states: Secondary | ICD-10-CM | POA: Insufficient documentation

## 2020-09-02 NOTE — Progress Notes (Signed)
Daily Session Note  Patient Details  Name: Devon Sanchez MRN: [id number] Date of Birth: November 24, 1950 Referring Provider:   Flowsheet Row CARDIAC REHAB PHASE II ORIENTATION from 07/01/2020 in [place]  Referring Provider Dr. Orvan Seen      Encounter Date: 09/02/2020  Check In:  Session Check In - 09/02/20 1100      Check-In   Supervising physician immediately available to respond to emergencies [facility] MD immediately available    Physician(s) Dr. Harl Bowie    Location [facility]    Staff Present Cathren Harsh, MS, Exercise Physiologist;Avalynne Diver Kris Mouton, MS, ACSM-CEP, Exercise Physiologist    Virtual Visit No    Medication changes reported     No    Fall or balance concerns reported    No    Tobacco Cessation No Change    Warm-up and Cool-down Performed as group-led instruction    Resistance Training Performed Yes    VAD Patient? No    PAD/SET Patient? No      Pain Assessment   Currently in Pain? No/denies    Pain Score 0-No pain    Multiple Pain Sites No           Capillary Blood Glucose: No results found for this or any previous visit (from the past 24 hour(s)).    Social History   Tobacco Use  Smoking Status Never Smoker  Smokeless Tobacco Never Used    Goals Met:  Independence with exercise equipment Exercise tolerated well No report of cardiac concerns or symptoms Strength training completed today  Goals Unmet:  Not Applicable  Comments: checkout time is 1200   Dr. Kathie Dike is Medical Director for [facility] Pulmonary Rehab.

## 2020-09-05 ENCOUNTER — Other Ambulatory Visit: Payer: Self-pay

## 2020-09-05 ENCOUNTER — Encounter (HOSPITAL_COMMUNITY)
Admission: RE | Admit: 2020-09-05 | Discharge: 2020-09-05 | Disposition: A | Discharge status: Home / Self Care | Payer: Medicare Other | Source: Ambulatory Visit | Attending: Cardiology | Admitting: Cardiology

## 2020-09-05 VITALS — Wt 242.9 lb

## 2020-09-05 DIAGNOSIS — Z952 Presence of prosthetic heart valve: Secondary | ICD-10-CM

## 2020-09-05 DIAGNOSIS — Z8679 Personal history of other diseases of the circulatory system: Secondary | ICD-10-CM | POA: Diagnosis not present

## 2020-09-05 DIAGNOSIS — Z9889 Other specified postprocedural states: Secondary | ICD-10-CM | POA: Diagnosis not present

## 2020-09-05 NOTE — Progress Notes (Signed)
Daily Session Note  Patient Details  Name: Devon Sanchez MRN: [id number] Date of Birth: [birth year] Referring Provider:   Flowsheet Row CARDIAC REHAB PHASE II ORIENTATION from 07/01/2020 in [facility]  Referring Provider Dr. Orvan Seen      Encounter Date: 09/05/2020  Check In:  Session Check In - 09/05/20 1100      Check-In   Supervising physician immediately available to respond to emergencies [facility] MD immediately available    Physician(s) Dr. Harl Bowie    Location [facility]    Staff Present Cathren Harsh, MS, Exercise Physiologist;Dalton Kris Mouton, MS, ACSM-CEP, Exercise Physiologist    Virtual Visit No    Medication changes reported     No    Fall or balance concerns reported    No    Tobacco Cessation No Change    Warm-up and Cool-down Performed as group-led instruction    Resistance Training Performed Yes    VAD Patient? No    PAD/SET Patient? No      Pain Assessment   Currently in Pain? No/denies    Pain Score 0-No pain    Multiple Pain Sites No           Capillary Blood Glucose: No results found for this or any previous visit (from the past 24 hour(s)).    Social History   Tobacco Use  Smoking Status Never Smoker  Smokeless Tobacco Never Used    Goals Met:  Independence with exercise equipment Exercise tolerated well No report of cardiac concerns or symptoms Strength training completed today  Goals Unmet:  Not Applicable  Comments: check out 1200   Dr. Kathie Dike is Medical Director for [facility].

## 2020-09-06 DIAGNOSIS — I129 Hypertensive chronic kidney disease with stage 1 through stage 4 chronic kidney disease, or unspecified chronic kidney disease: Secondary | ICD-10-CM | POA: Diagnosis not present

## 2020-09-06 DIAGNOSIS — R7303 Prediabetes: Secondary | ICD-10-CM | POA: Diagnosis not present

## 2020-09-06 DIAGNOSIS — E785 Hyperlipidemia, unspecified: Secondary | ICD-10-CM | POA: Diagnosis not present

## 2020-09-06 DIAGNOSIS — E782 Mixed hyperlipidemia: Secondary | ICD-10-CM | POA: Diagnosis not present

## 2020-09-06 DIAGNOSIS — I10 Essential (primary) hypertension: Secondary | ICD-10-CM | POA: Diagnosis not present

## 2020-09-07 ENCOUNTER — Other Ambulatory Visit: Payer: Self-pay

## 2020-09-07 ENCOUNTER — Encounter (HOSPITAL_COMMUNITY)
Admission: RE | Admit: 2020-09-07 | Discharge: 2020-09-07 | Disposition: A | Discharge status: Home / Self Care | Payer: Medicare Other | Source: Ambulatory Visit | Attending: Cardiology | Admitting: Cardiology

## 2020-09-07 DIAGNOSIS — Z952 Presence of prosthetic heart valve: Secondary | ICD-10-CM

## 2020-09-07 DIAGNOSIS — Z8679 Personal history of other diseases of the circulatory system: Secondary | ICD-10-CM

## 2020-09-07 DIAGNOSIS — Z9889 Other specified postprocedural states: Secondary | ICD-10-CM | POA: Diagnosis not present

## 2020-09-07 NOTE — Progress Notes (Signed)
Cardiac Individual Treatment Plan  Patient Details  Name: Devon Sanchez MRN: [id number] Date of Birth: 10/15/1950 Referring Provider:   Flowsheet Row CARDIAC REHAB PHASE II ORIENTATION from 07/01/2020 in [place]  Referring Provider Dr. Orvan Seen      Initial Encounter Date:  Flowsheet Row CARDIAC REHAB PHASE II ORIENTATION from 07/01/2020 in [place]  Date 07/01/20      Visit Diagnosis: S/P aortic aneurysm repair  S/P TAVR (transcatheter aortic valve replacement)  Patient's Home Medications on Admission:  Current Outpatient Medications:  .  acetaminophen (TYLENOL) 500 MG tablet, Take 500 mg by mouth every 6 (six) hours as needed for moderate pain or headache. , Disp: , Rfl:  .  amoxicillin (AMOXIL) 500 MG capsule, Take 4 capsules (2,000 mg total) by mouth once as needed for up to 1 dose (Dental work). (Patient not taking: Reported on 08/19/2020), Disp: 4 capsule, Rfl: 3 .  aspirin EC 325 MG EC tablet, Take 1 tablet (325 mg total) by mouth daily., Disp: , Rfl:  .  carvedilol (COREG) 6.25 MG tablet, TAKE 1 TABLET BY MOUTH  TWICE DAILY WITH A MEAL, Disp: 180 tablet, Rfl: 3 .  escitalopram (LEXAPRO) 10 MG tablet, Take 1 tablet (10 mg total) by mouth daily., Disp: 30 tablet, Rfl: 2 .  Multiple Vitamins-Minerals (CENTRUM SILVER PO), Take 1 tablet by mouth every evening. , Disp: , Rfl:  .  nitroGLYCERIN (NITROSTAT) 0.4 MG SL tablet, Place 1 tablet (0.4 mg total) under the tongue every 5 (five) minutes as needed for up to 25 days for chest pain. (Patient taking differently: Place 0.4 mg under the tongue every 5 (five) minutes as needed for chest pain. Pt hasn't used in ~10 years, but keeps it on hand in case), Disp: 25 tablet, Rfl: 3 .  Omega-3 Fatty Acids (FISH OIL) 1200 MG CAPS, Take 3 capsules by mouth at bedtime., Disp: , Rfl:  .  omeprazole (PRILOSEC) 20 MG capsule, Take 1 capsule by mouth daily., Disp: , Rfl:  .  polyethylene glycol  (MIRALAX / GLYCOLAX) 17 g packet, Take 17 g by mouth daily., Disp: , Rfl:  .  rosuvastatin (CRESTOR) 20 MG tablet, Take 1 tablet (20 mg total) by mouth daily., Disp: , Rfl:   Past Medical History: Past Medical History:  Diagnosis Date  . Anxiety   . Aortic root dilatation ([place])   . Atherosclerosis   . Bicuspid aortic valve 07/25/2018  . Coronary artery disease   . Depression   . Dizziness and giddiness   . Erectile dysfunction   . Hypercholesterolemia   . Hyperglycemia   . Hyperlipidemia   . Hypertension   . MI (myocardial infarction) ([facility] [place])   . Midsystolic murmur    bicuspid AV with moderate AS (08/2019)  . Reflux   . Sleep apnea   . Thoracic ascending aortic aneurysm ([facility])     Tobacco Use: Social History   Tobacco Use  Smoking Status Never Smoker  Smokeless Tobacco Never Used    Labs: Recent Review Flowsheet Data    Labs for ITP Cardiac and Pulmonary Rehab Latest Ref Rng & Units 05/12/2020 05/12/2020 05/12/2020 05/12/2020 05/12/2020   Hemoglobin A1c 4.8 - 5.6 % - - - - -   PHART 7.350 - 7.450 7.350 - 7.371 7.338(L) 7.365   PCO2ART 32.0 - 48.0 mmHg 43.9 - 40.4 45.4 40.5   HCO3 20.0 - 28.0 mmol/L 24.2 - 24.0 24.3 23.1   TCO2 22 - 32 mmol/L 26  23 25 26 24    ACIDBASEDEF 0.0 - 2.0 mmol/L 1.0 - 2.0 1.0 2.0   O2SAT % 99.0 - 94.0 95.0 92.0      Capillary Blood Glucose: Lab Results  Component Value Date   GLUCAP 119 (H) 05/19/2020   GLUCAP 113 (H) 05/18/2020   GLUCAP 108 (H) 05/18/2020   GLUCAP 98 05/18/2020   GLUCAP 94 05/18/2020     Exercise Target Goals: Exercise Program Goal: Individual exercise prescription set using results from initial 6 min walk test and THRR while considering  patient's activity barriers and safety.   Exercise Prescription Goal: Starting with aerobic activity 30 plus minutes a day, 3 days per week for initial exercise prescription. Provide home exercise prescription and guidelines that participant acknowledges understanding prior to  discharge.  Activity Barriers & Risk Stratification:  Activity Barriers & Cardiac Risk Stratification - 07/01/20 1248      Activity Barriers & Cardiac Risk Stratification   Activity Barriers Back Problems    Cardiac Risk Stratification High           6 Minute Walk:  6 Minute Walk    Row Name 07/01/20 1419         6 Minute Walk   Phase Initial     Distance 1375 feet     Walk Time 6 minutes     # of Rest Breaks 0     MPH 2.6     METS 3.22     RPE 10     VO2 Peak 11.29     Symptoms Yes (comment)     Comments 2/10 hip pain in both hips     Resting HR 99 bpm     Resting BP 98/70     Resting Oxygen Saturation  94 %     Exercise Oxygen Saturation  during 6 min walk 93 %     Max Ex. HR 126 bpm     Max Ex. BP 120/76     2 Minute Post BP 110/80            Oxygen Initial Assessment:   Oxygen Re-Evaluation:   Oxygen Discharge (Final Oxygen Re-Evaluation):   Initial Exercise Prescription:  Initial Exercise Prescription - 07/01/20 1400      Date of Initial Exercise RX and Referring Provider   Date 07/01/20    Referring Provider Dr. Orvan Seen    Expected Discharge Date 09/23/20      Treadmill   MPH 1.5    Grade 0    Minutes 22      Recumbant Elliptical   Level 1    RPM 60    Minutes 17      Intensity   THRR 40-80% of Max Heartrate 61-122    Ratings of Perceived Exertion 11-13      Resistance Training   Training Prescription Yes    Weight 3 lbs    Reps 10-15           Perform Capillary Blood Glucose checks as needed.  Exercise Prescription Changes:   Exercise Prescription Changes    Row Name 07/08/20 1100 07/15/20 1200 07/25/20 1200 08/08/20 1200 08/22/20 1200     Response to Exercise   Blood Pressure (Admit) 110/82 -- 104/60 110/64 106/72   Blood Pressure (Exercise) 132/78 -- 132/80 132/80 138/84   Blood Pressure (Exit) 118/78 -- 108/72 104/78 104/60   Heart Rate (Admit) 107 bpm -- 88 bpm 72 bpm 90 bpm   Heart Rate (Exercise) 125 bpm --  124  bpm 120 bpm 117 bpm   Heart Rate (Exit) 111 bpm -- 102 bpm 91 bpm 98 bpm   Rating of Perceived Exertion (Exercise) 11 -- 10 10 10    Duration Continue with 30 min of aerobic exercise without signs/symptoms of physical distress. -- Continue with 30 min of aerobic exercise without signs/symptoms of physical distress. Continue with 30 min of aerobic exercise without signs/symptoms of physical distress. Continue with 30 min of aerobic exercise without signs/symptoms of physical distress.   Intensity THRR unchanged -- THRR unchanged THRR unchanged THRR unchanged     Progression   Progression Continue to progress workloads to maintain intensity without signs/symptoms of physical distress. -- Continue to progress workloads to maintain intensity without signs/symptoms of physical distress. Continue to progress workloads to maintain intensity without signs/symptoms of physical distress. Continue to progress workloads to maintain intensity without signs/symptoms of physical distress.     Resistance Training   Training Prescription Yes -- Yes Yes Yes   Weight 3 lbs -- 5 lbs 5 lbs 5 lbs   Reps 10-15 -- 10-15 10-15 10-15   Time 10 Minutes -- 10 Minutes 10 Minutes 10 Minutes     Treadmill   MPH 1.5 -- 2.7 3 3    Grade 0 -- 0 0 1   Minutes 22 -- 22 22 22    METs 2.15 -- 3.01 3.3 3.7     Recumbant Elliptical   Level 1 -- 2 2 3    RPM 60 -- 67 73 74   Minutes 17 -- 17 17 17    METs 3 -- 3.3 3.7 3.7     Home Exercise Plan   Plans to continue exercise at -- Home (comment) -- -- --   Frequency -- Add 2 additional days to program exercise sessions. -- -- --   Initial Home Exercises Provided -- 07/15/20 -- -- --   [place] Name 09/05/20 1200             Response to Exercise   Blood Pressure (Admit) 124/76       Blood Pressure (Exercise) 142/74       Blood Pressure (Exit) 108/72       Heart Rate (Admit) 68 bpm       Heart Rate (Exercise) 120 bpm       Heart Rate (Exit) 84 bpm       Rating of Perceived  Exertion (Exercise) 11       Duration Continue with 30 min of aerobic exercise without signs/symptoms of physical distress.       Intensity THRR unchanged               Progression   Progression Continue to progress workloads to maintain intensity without signs/symptoms of physical distress.               Resistance Training   Training Prescription Yes       Weight 8 lbs       Reps 10-15       Time 10 Minutes               Treadmill   MPH 3       Grade 3       Minutes 22       METs 4.52               Recumbant Elliptical   Level 4       RPM 79       Minutes 17  METs 4.3              Exercise Comments:   Exercise Comments    Row Name 07/15/20 1204           Exercise Comments Reviewed home exercise with patient. He is currently walking at home. We discussed walking farther and loner, specifically 25 minutes. He is also going to do resistance training at home 1 day per week. He expressed interest in learning more about diet and how to eat better. We discussed diet a little bit, but will go more in detail at a later date.              Exercise Goals and Review:   Exercise Goals    Row Name 07/01/20 1424 07/11/20 1132 08/08/20 1208 09/05/20 1200       Exercise Goals   Increase Physical Activity Yes Yes Yes Yes    Intervention Provide advice, education, support and counseling about physical activity/exercise needs.;Develop an individualized exercise prescription for aerobic and resistive training based on initial evaluation findings, risk stratification, comorbidities and participant's personal goals. Provide advice, education, support and counseling about physical activity/exercise needs.;Develop an individualized exercise prescription for aerobic and resistive training based on initial evaluation findings, risk stratification, comorbidities and participant's personal goals. Provide advice, education, support and counseling about physical activity/exercise  needs.;Develop an individualized exercise prescription for aerobic and resistive training based on initial evaluation findings, risk stratification, comorbidities and participant's personal goals. Provide advice, education, support and counseling about physical activity/exercise needs.;Develop an individualized exercise prescription for aerobic and resistive training based on initial evaluation findings, risk stratification, comorbidities and participant's personal goals.    Expected Outcomes Short Term: Attend rehab on a regular basis to increase amount of physical activity.;Long Term: Add in home exercise to make exercise part of routine and to increase amount of physical activity.;Long Term: Exercising regularly at least 3-5 days a week. Short Term: Attend rehab on a regular basis to increase amount of physical activity.;Long Term: Add in home exercise to make exercise part of routine and to increase amount of physical activity.;Long Term: Exercising regularly at least 3-5 days a week. Short Term: Attend rehab on a regular basis to increase amount of physical activity.;Long Term: Add in home exercise to make exercise part of routine and to increase amount of physical activity.;Long Term: Exercising regularly at least 3-5 days a week. Short Term: Attend rehab on a regular basis to increase amount of physical activity.;Long Term: Add in home exercise to make exercise part of routine and to increase amount of physical activity.;Long Term: Exercising regularly at least 3-5 days a week.    Increase Strength and Stamina Yes Yes Yes Yes    Intervention Provide advice, education, support and counseling about physical activity/exercise needs.;Develop an individualized exercise prescription for aerobic and resistive training based on initial evaluation findings, risk stratification, comorbidities and participant's personal goals. Provide advice, education, support and counseling about physical activity/exercise  needs.;Develop an individualized exercise prescription for aerobic and resistive training based on initial evaluation findings, risk stratification, comorbidities and participant's personal goals. Provide advice, education, support and counseling about physical activity/exercise needs.;Develop an individualized exercise prescription for aerobic and resistive training based on initial evaluation findings, risk stratification, comorbidities and participant's personal goals. Provide advice, education, support and counseling about physical activity/exercise needs.;Develop an individualized exercise prescription for aerobic and resistive training based on initial evaluation findings, risk stratification, comorbidities and participant's personal goals.    Expected Outcomes Short Term: Increase  workloads from initial exercise prescription for resistance, speed, and METs.;Short Term: Perform resistance training exercises routinely during rehab and add in resistance training at home;Long Term: Improve cardiorespiratory fitness, muscular endurance and strength as measured by increased METs and functional capacity (6MWT) Short Term: Increase workloads from initial exercise prescription for resistance, speed, and METs.;Short Term: Perform resistance training exercises routinely during rehab and add in resistance training at home;Long Term: Improve cardiorespiratory fitness, muscular endurance and strength as measured by increased METs and functional capacity (6MWT) Short Term: Increase workloads from initial exercise prescription for resistance, speed, and METs.;Short Term: Perform resistance training exercises routinely during rehab and add in resistance training at home;Long Term: Improve cardiorespiratory fitness, muscular endurance and strength as measured by increased METs and functional capacity (6MWT) Short Term: Increase workloads from initial exercise prescription for resistance, speed, and METs.;Short Term: Perform  resistance training exercises routinely during rehab and add in resistance training at home;Long Term: Improve cardiorespiratory fitness, muscular endurance and strength as measured by increased METs and functional capacity (6MWT)    Able to understand and use rate of perceived exertion (RPE) scale Yes Yes Yes Yes    Intervention Provide education and explanation on how to use RPE scale Provide education and explanation on how to use RPE scale Provide education and explanation on how to use RPE scale Provide education and explanation on how to use RPE scale    Expected Outcomes Short Term: Able to use RPE daily in rehab to express subjective intensity level;Long Term:  Able to use RPE to guide intensity level when exercising independently Short Term: Able to use RPE daily in rehab to express subjective intensity level;Long Term:  Able to use RPE to guide intensity level when exercising independently Short Term: Able to use RPE daily in rehab to express subjective intensity level;Long Term:  Able to use RPE to guide intensity level when exercising independently Short Term: Able to use RPE daily in rehab to express subjective intensity level;Long Term:  Able to use RPE to guide intensity level when exercising independently    Knowledge and understanding of Target Heart Rate Range (THRR) Yes Yes Yes Yes    Intervention Provide education and explanation of THRR including how the numbers were predicted and where they are located for reference Provide education and explanation of THRR including how the numbers were predicted and where they are located for reference Provide education and explanation of THRR including how the numbers were predicted and where they are located for reference Provide education and explanation of THRR including how the numbers were predicted and where they are located for reference    Expected Outcomes Short Term: Able to state/look up THRR;Long Term: Able to use THRR to govern intensity  when exercising independently;Short Term: Able to use daily as guideline for intensity in rehab Short Term: Able to state/look up THRR;Long Term: Able to use THRR to govern intensity when exercising independently;Short Term: Able to use daily as guideline for intensity in rehab Short Term: Able to state/look up THRR;Long Term: Able to use THRR to govern intensity when exercising independently;Short Term: Able to use daily as guideline for intensity in rehab Short Term: Able to state/look up THRR;Long Term: Able to use THRR to govern intensity when exercising independently;Short Term: Able to use daily as guideline for intensity in rehab    Able to check pulse independently Yes Yes Yes Yes    Intervention Provide education and demonstration on how to check pulse in carotid and radial  arteries.;Review the importance of being able to check your own pulse for safety during independent exercise Provide education and demonstration on how to check pulse in carotid and radial arteries.;Review the importance of being able to check your own pulse for safety during independent exercise Provide education and demonstration on how to check pulse in carotid and radial arteries.;Review the importance of being able to check your own pulse for safety during independent exercise Provide education and demonstration on how to check pulse in carotid and radial arteries.;Review the importance of being able to check your own pulse for safety during independent exercise    Expected Outcomes Short Term: Able to explain why pulse checking is important during independent exercise;Long Term: Able to check pulse independently and accurately Short Term: Able to explain why pulse checking is important during independent exercise;Long Term: Able to check pulse independently and accurately Short Term: Able to explain why pulse checking is important during independent exercise;Long Term: Able to check pulse independently and accurately Short Term:  Able to explain why pulse checking is important during independent exercise;Long Term: Able to check pulse independently and accurately    Understanding of Exercise Prescription Yes Yes Yes Yes    Intervention Provide education, explanation, and written materials on patient's individual exercise prescription Provide education, explanation, and written materials on patient's individual exercise prescription Provide education, explanation, and written materials on patient's individual exercise prescription Provide education, explanation, and written materials on patient's individual exercise prescription    Expected Outcomes Short Term: Able to explain program exercise prescription;Long Term: Able to explain home exercise prescription to exercise independently Short Term: Able to explain program exercise prescription;Long Term: Able to explain home exercise prescription to exercise independently Short Term: Able to explain program exercise prescription;Long Term: Able to explain home exercise prescription to exercise independently Short Term: Able to explain program exercise prescription;Long Term: Able to explain home exercise prescription to exercise independently           Exercise Goals Re-Evaluation :  Exercise Goals Re-Evaluation    Row Name 07/11/20 1132 08/08/20 1208 09/05/20 1200         Exercise Goal Re-Evaluation   Exercise Goals Review Increase Physical Activity;Increase Strength and Stamina;Able to understand and use rate of perceived exertion (RPE) scale;Knowledge and understanding of Target Heart Rate Range (THRR);Able to check pulse independently;Understanding of Exercise Prescription Increase Physical Activity;Increase Strength and Stamina;Able to understand and use rate of perceived exertion (RPE) scale;Knowledge and understanding of Target Heart Rate Range (THRR);Able to check pulse independently;Understanding of Exercise Prescription Increase Physical Activity;Increase Strength and  Stamina;Able to understand and use rate of perceived exertion (RPE) scale;Knowledge and understanding of Target Heart Rate Range (THRR);Able to check pulse independently;Understanding of Exercise Prescription     Comments Pt has attended 3 exercise sessions. He is motivated and will likely progress quickly in the program. He currently exercises at 3.0 METs on the elliptical. Will continue to monitor and progress as able. Patient has completed 15 exercise sessions. He has progressed very well with exercise intensities. He is very eager to learn about exercise, nutrition, and how to live a healthy lifestyle. He continues to inquire about home exercise and how to do it safely. He is currently exercising at home by walking every day he is not in rehab. He is learning how to monitor his heart rate and intensities. He is currently exercising at 3.3 METs on the treadmill at rehab. Will continue to monitor and progress as able. patient has completed 46  exercise sessions. He has progressed well in the program so far. He is tolerating increased intensities well. He is exercising at home by [place] 30-40 minutes per day. He asks questions frequently about exercise to learn how to exercise at home safely. He is a joy to work with and always has a positive attitude. He is currently exercising at 4.3 METs on the elliptical. Will continue to monitor and progress as able.     Expected Outcomes Through exercise at rehab and by engaging in a home exercise program the patient will reach their goals. Through exercise at rehab and by engaging in a home exercise program the patient will reach their goals. Through exercise at rehab and by engaging in a home exercise program the patient will reach their goals.             Discharge Exercise Prescription (Final Exercise Prescription Changes):  Exercise Prescription Changes - 09/05/20 1200      Response to Exercise   Blood Pressure (Admit) 124/76    Blood Pressure (Exercise)  142/74    Blood Pressure (Exit) 108/72    Heart Rate (Admit) 68 bpm    Heart Rate (Exercise) 120 bpm    Heart Rate (Exit) 84 bpm    Rating of Perceived Exertion (Exercise) 11    Duration Continue with 30 min of aerobic exercise without signs/symptoms of physical distress.    Intensity THRR unchanged      Progression   Progression Continue to progress workloads to maintain intensity without signs/symptoms of physical distress.      Resistance Training   Training Prescription Yes    Weight 8 lbs    Reps 10-15    Time 10 Minutes      Treadmill   MPH 3    Grade 3    Minutes 22    METs 4.52      Recumbant Elliptical   Level 4    RPM 79    Minutes 17    METs 4.3           Nutrition:  Target Goals: Understanding of nutrition guidelines, daily intake of sodium 1500mg , cholesterol 200mg , calories 30% from fat and 7% or less from saturated fats, daily to have 5 or more servings of fruits and vegetables.  Biometrics:  Pre Biometrics - 09/05/20 1200      Pre Biometrics   Weight 110.2 kg    BMI (Calculated) 31.18            Nutrition Therapy Plan and Nutrition Goals:  Nutrition Therapy & Goals - 08/01/20 1242      Personal Nutrition Goals   Comments We will continue to provide heart healthy nutritional education through handouts.      Intervention Plan   Intervention Nutrition handout(s) given to patient.           Nutrition Assessments:  Nutrition Assessments - 07/01/20 1442      MEDFICTS Scores   Pre Score 33          MEDIFICTS Score Key:  ?70 Need to make dietary changes   40-70 Heart Healthy Diet  ? 40 Therapeutic Level Cholesterol Diet   Picture Your Plate Scores:  <12 Unhealthy dietary pattern with much room for improvement.  41-50 Dietary pattern unlikely to meet recommendations for good health and room for improvement.  51-60 More healthful dietary pattern, with some room for improvement.   >60 Healthy dietary pattern, although  there may be some specific behaviors that could be  improved.    Nutrition Goals Re-Evaluation:   Nutrition Goals Discharge (Final Nutrition Goals Re-Evaluation):   Psychosocial: Target Goals: Acknowledge presence or absence of significant depression and/or stress, maximize coping skills, provide positive support system. Participant is able to verbalize types and ability to use techniques and skills needed for reducing stress and depression.  Initial Review & Psychosocial Screening:  Initial Psych Review & Screening - 07/01/20 1434      Initial Review   Current issues with History of Depression      Family Dynamics   Good Support System? Yes    Comments Patient lives with his wife of many years. She is his support system along with his daughter. He is currently taking escitalopram 10 mg daily and has been taking this for 20 years. He denies any depression, anxiety, or stress. He is looking forward to doing the program and getting back to his life. He demonstrates a very positive attitude regarding his health and his future.      Barriers   Psychosocial barriers to participate in program Psychosocial barriers identified (see note)      Screening Interventions   Interventions Encouraged to exercise    Expected Outcomes Short Term goal: Identification and review with participant of any Quality of Life or Depression concerns found by scoring the questionnaire.           Quality of Life Scores:  Quality of Life - 07/01/20 1425      Quality of Life   Select Quality of Life      Quality of Life Scores   Health/Function Pre 26.46 %    Socioeconomic Pre 27.5 %    Psych/Spiritual Pre 29.14 %    Family Pre 28.8 %    GLOBAL Pre 27.61 %          Scores of 19 and below usually indicate a poorer quality of life in these areas.  A difference of  2-3 points is a clinically meaningful difference.  A difference of 2-3 points in the total score of the Quality of Life Index has been  associated with significant improvement in overall quality of life, self-image, physical symptoms, and general health in studies assessing change in quality of life.  PHQ-9: Recent Review Flowsheet Data    Depression screen PHQ 2/9 07/01/2020   Down, Depressed, Hopeless 1   PHQ - 2 Score 1   Altered sleeping 2    Tired, decreased energy 0   Change in appetite 0   Feeling bad or failure about yourself  0   Trouble concentrating 0   Moving slowly or fidgety/restless 0   Suicidal thoughts 0   PHQ-9 Score 3   Difficult doing work/chores Somewhat difficult     Interpretation of Total Score  Total Score Depression Severity:  1-4 = Minimal depression, 5-9 = Mild depression, 10-14 = Moderate depression, 15-19 = Moderately severe depression, 20-27 = Severe depression   Psychosocial Evaluation and Intervention:  Psychosocial Evaluation - 07/01/20 1437      Psychosocial Evaluation & Interventions   Interventions Stress management education;Relaxation education;Encouraged to exercise with the program and follow exercise prescription    Comments Patient has a history of depression and has been taking Lexapro for appx. 20 years. His initial QOL score was 27.61% overall and his PHQ-9 score was 3. Will continue to monitor.    Expected Outcomes Patient will have no psychosocial issues identified at discharge.    Continue Psychosocial Services  No Follow up required  Psychosocial Re-Evaluation:  Psychosocial Re-Evaluation    Row Name 07/06/20 [phone] 08/01/20 1243 08/31/20 0917         Psychosocial Re-Evaluation   Current issues with History of Depression -- --     Comments Patient is new to the program completing 2 sessions. He is currently taking Lexapro10 mg which he has been on long term. He denies any depression. He is very happy to get started in the program and demonstrates a very positve outlook regarding his future. Will continue to monitor. Patient continues to take Lexapro 10  mg for h/o depression. He continues to have no psychososical issues identified. He has completed 13 sessions gaining 4 lbs since last 30 day review. He is doing well in the program with consistent attendance. His blood pressure remains WNL. He has a routine appointment with his cardiologist 08/19/20. His personal goals for the program are to get stronger; return to his ADL's; and eat healthier. Will continue to monitor for progress as he works toward meeting these goals. Patient continues to take Lexapro 10 mg for h/o depression. He continues to have no psychososical issues identified completing 25 sessions. He continues to enjoy coming and demonstrates a positive outlook regarding his future. Will continue to monitor.     Expected Outcomes Patient will have no psychosocial issues identified at discharge. Patient will have no psychosocial issues identified at discharge. Patient will have no psychosocial issues identified at discharge.     Continue Psychosocial Services  No Follow up required No Follow up required No Follow up required            Psychosocial Discharge (Final Psychosocial Re-Evaluation):  Psychosocial Re-Evaluation - 08/31/20 0917      Psychosocial Re-Evaluation   Comments Patient continues to take Lexapro 10 mg for h/o depression. He continues to have no psychososical issues identified completing 25 sessions. He continues to enjoy coming and demonstrates a positive outlook regarding his future. Will continue to monitor.    Expected Outcomes Patient will have no psychosocial issues identified at discharge.    Continue Psychosocial Services  No Follow up required           Vocational Rehabilitation: Provide vocational rehab assistance to qualifying candidates.   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           Education: Education Goals: Education classes will be provided on a weekly basis, covering required topics. Participant will state understanding/return demonstration of topics presented.  Learning Barriers/Preferences:  Learning Barriers/Preferences - 07/01/20 1442      Learning Barriers/Preferences   Learning Barriers None    Learning Preferences Written Material;Audio;Video           Education Topics: Hypertension, Hypertension Reduction -Define heart disease and high blood pressure. Discus how high blood pressure affects the body and ways to reduce high blood pressure.   Exercise and Your Heart -Discuss why it is important to exercise, the FITT principles of exercise, normal and abnormal responses to exercise, and how to exercise safely.   Angina -Discuss definition of angina, causes of angina, treatment of angina, and how to decrease risk of having angina.   Cardiac Medications -Review what the following cardiac medications are used for, how they affect the  body, and side effects that may occur when taking the medications.  Medications include Aspirin, Beta blockers, calcium channel blockers, ACE Inhibitors, angiotensin receptor blockers, diuretics, digoxin, and antihyperlipidemics.   Congestive Heart Failure -Discuss the definition of CHF, how to live with CHF, the signs and symptoms of CHF, and how keep track of weight and sodium intake. Flowsheet Row CARDIAC REHAB PHASE II EXERCISE from 08/31/2020 in [place]  Date 07/06/20  Educator mk  Instruction Review Code 2- Demonstrated Understanding      Heart Disease and Intimacy -Discus the effect sexual activity has on the heart, how changes occur during intimacy as we age, and safety during sexual activity. Flowsheet Row CARDIAC REHAB PHASE II EXERCISE from 08/31/2020 in [place]   Date 07/13/20  Educator Mk  Instruction Review Code 2- Demonstrated Understanding      Smoking Cessation / COPD -Discuss different methods to quit smoking, the health benefits of quitting smoking, and the definition of COPD. Flowsheet Row CARDIAC REHAB PHASE II EXERCISE from 08/31/2020 in [place]  Date 07/20/20  Educator mk  Instruction Review Code 2- Demonstrated Understanding      Nutrition I: Fats -Discuss the types of cholesterol, what cholesterol does to the heart, and how cholesterol levels can be controlled. Flowsheet Row CARDIAC REHAB PHASE II EXERCISE from 08/31/2020 in [place]  Date 07/27/20  Educator mk  Instruction Review Code 2- Demonstrated Understanding      Nutrition II: Labels -Discuss the different components of food labels and how to read food label [place] from 08/31/2020 in [place]  Date 08/03/20  Educator DJ  Instruction Review Code 1- Verbalizes Understanding      Heart Parts/Heart Disease and PAD -Discuss the anatomy of the heart, the pathway of blood circulation through the heart, and these are affected by heart disease.   Stress I: Signs and Symptoms -Discuss the causes of stress, how stress may lead to anxiety and depression, and ways to limit stress.   Stress II: Relaxation -Discuss different types of relaxation techniques to limit stress.   Warning Signs of Stroke / TIA -Discuss definition of a stroke, what the signs and symptoms are of a stroke, and how to identify when someone is having stroke. Flowsheet Row CARDIAC REHAB PHASE II EXERCISE from 08/31/2020 in [place]  Date 08/31/20  Educator mk  Instruction Review Code 2- Demonstrated Understanding      Knowledge Questionnaire Score:  Knowledge Questionnaire Score - 07/01/20 1443      Knowledge Questionnaire Score   Pre Score 22/24            Core Components/Risk Factors/Patient Goals at Admission:  Personal Goals and Risk Factors at Admission - 07/01/20 1443      Core Components/Risk Factors/Patient Goals on Admission    Weight Management Obesity    Personal Goal Other Yes    Personal Goal Get stronger; return to doing his ADL's; change his eating habits.    Intervention Provide education on lifestye changes and risk modification and monitored, supervised exercise.    Expected Outcomes Patient will meet his personal and program goals.           Core Components/Risk Factors/Patient Goals Review:   Goals and Risk Factor Review    Row Name 07/06/20 0751 08/31/20 0919           Core Components/Risk Factors/Patient Goals Review  Personal Goals Review Weight Management/Obesity;Other Weight Management/Obesity;Other      Review Patient is new to the program completing 2 sessions. 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 He has multiple risk factors for CAD and is doing the program for risk modification. He has been through the program before in [place] secondary to NSTEMI. His personal goals for the program are to get stronger and to be able to return to his ADL's and eat heatlhier. Will continue to monitor for progress. Patient has completed 25 sessions and is doing well in the program with consistent attendance and progression losing 1 lb since last 30 day review. He works very hard during his sessions and says he is getting stronger and is exercising on the days he does not come to rehab. His personal goals for the program are to get stronger; return to his ADL's and eat healthier. We will continue to monitor as he works toward meeting these goals.      Expected Outcomes Patient will complete the program meeting both personal and program goals. Patient will complete the program meeting both personal and program goals.             Core Components/Risk Factors/Patient  Goals at Discharge (Final Review):   Goals and Risk Factor Review - 08/31/20 0919      Core Components/Risk Factors/Patient Goals Review   Personal Goals Review Weight Management/Obesity;Other    Review Patient has completed 25 sessions and is doing well in the program with consistent attendance and progression losing 1 lb since last 30 day review. He works very hard during his sessions and says he is getting stronger and is exercising on the days he does not come to rehab. His personal goals for the program are to get stronger; return to his ADL's and eat healthier. We will continue to monitor as he works toward meeting these goals.    Expected Outcomes Patient will complete the program meeting both personal and program goals.           ITP Comments:   Comments: ITP REVIEW Pt is making expected progress toward Cardiac Rehab goals after completing 28 sessions. Recommend continued exercise, life style modification, education, and increased stamina and strength.

## 2020-09-07 NOTE — Progress Notes (Signed)
Daily Session Note  Patient Details  Name: Devon Sanchez MRN: [id number] Date of Birth: [birth year] Referring Provider:   Flowsheet Row CARDIAC REHAB PHASE II ORIENTATION from 07/01/2020 in [place]  Referring Provider Dr. Orvan Seen      Encounter Date: 09/07/2020  Check In:  Session Check In - 09/07/20 1100      Check-In   Supervising physician immediately available to respond to emergencies [facility] MD immediately available    Physician(s) Dr. Harl Bowie    Location [facility]    Staff Present Cathren Harsh, MS, Exercise Physiologist;Debra Wynetta Emery, RN, BSN    Virtual Visit No    Medication changes reported     No    Fall or balance concerns reported    No    Tobacco Cessation No Change    Warm-up and Cool-down Performed as group-led instruction    Resistance Training Performed Yes    VAD Patient? No    PAD/SET Patient? No      Pain Assessment   Currently in Pain? No/denies    Pain Score 0-No pain    Multiple Pain Sites No           Capillary Blood Glucose: No results found for this or any previous visit (from the past 24 hour(s)).    Social History   Tobacco Use  Smoking Status Never Smoker  Smokeless Tobacco Never Used    Goals Met:  Independence with exercise equipment Exercise tolerated well No report of cardiac concerns or symptoms Strength training completed today  Goals Unmet:  Not Applicable  Comments: check out 1200   Dr. Kathie Dike is Medical Director for [facility] Pulmonary Rehab.

## 2020-09-08 DIAGNOSIS — I2510 Atherosclerotic heart disease of native coronary artery without angina pectoris: Secondary | ICD-10-CM | POA: Diagnosis not present

## 2020-09-08 DIAGNOSIS — R42 Dizziness and giddiness: Secondary | ICD-10-CM | POA: Diagnosis not present

## 2020-09-08 DIAGNOSIS — E785 Hyperlipidemia, unspecified: Secondary | ICD-10-CM | POA: Diagnosis not present

## 2020-09-08 DIAGNOSIS — I129 Hypertensive chronic kidney disease with stage 1 through stage 4 chronic kidney disease, or unspecified chronic kidney disease: Secondary | ICD-10-CM | POA: Diagnosis not present

## 2020-09-08 DIAGNOSIS — D509 Iron deficiency anemia, unspecified: Secondary | ICD-10-CM | POA: Diagnosis not present

## 2020-09-08 DIAGNOSIS — R7301 Impaired fasting glucose: Secondary | ICD-10-CM | POA: Diagnosis not present

## 2020-09-08 DIAGNOSIS — N182 Chronic kidney disease, stage 2 (mild): Secondary | ICD-10-CM | POA: Diagnosis not present

## 2020-09-09 ENCOUNTER — Encounter (HOSPITAL_COMMUNITY)
Admission: RE | Admit: 2020-09-09 | Discharge: 2020-09-09 | Disposition: A | Discharge status: Home / Self Care | Payer: Medicare Other | Source: Ambulatory Visit | Attending: Cardiology | Admitting: Cardiology

## 2020-09-09 ENCOUNTER — Other Ambulatory Visit: Payer: Self-pay

## 2020-09-09 DIAGNOSIS — Z9889 Other specified postprocedural states: Secondary | ICD-10-CM

## 2020-09-09 DIAGNOSIS — Z8679 Personal history of other diseases of the circulatory system: Secondary | ICD-10-CM

## 2020-09-09 DIAGNOSIS — Z952 Presence of prosthetic heart valve: Secondary | ICD-10-CM

## 2020-09-09 NOTE — Progress Notes (Signed)
Daily Session Note  Patient Details  Name: Devon Sanchez MRN: [id number] Date of Birth: Sep 19, 1950 Referring Provider:   Flowsheet Row CARDIAC REHAB PHASE II ORIENTATION from 07/01/2020 in [place]  Referring Provider Dr. Orvan Seen      Encounter Date: 09/09/2020  Check In:   Capillary Blood Glucose: No results found for this or any previous visit (from the past 24 hour(s)).    Social History   Tobacco Use  Smoking Status Never Smoker  Smokeless Tobacco Never Used    Goals Met:  Independence with exercise equipment Exercise tolerated well No report of cardiac concerns or symptoms Strength training completed today  Goals Unmet:  Not Applicable  Comments: checkout time is 1200   Dr. Kathie Dike is Medical Director for [facility] Pulmonary Rehab.

## 2020-09-12 ENCOUNTER — Other Ambulatory Visit: Payer: Self-pay

## 2020-09-12 ENCOUNTER — Encounter (HOSPITAL_COMMUNITY)
Admission: RE | Admit: 2020-09-12 | Discharge: 2020-09-12 | Disposition: A | Discharge status: Home / Self Care | Payer: Medicare Other | Source: Ambulatory Visit | Attending: Cardiology | Admitting: Cardiology

## 2020-09-12 DIAGNOSIS — Z8679 Personal history of other diseases of the circulatory system: Secondary | ICD-10-CM

## 2020-09-12 DIAGNOSIS — Z9889 Other specified postprocedural states: Secondary | ICD-10-CM

## 2020-09-12 DIAGNOSIS — Z952 Presence of prosthetic heart valve: Secondary | ICD-10-CM

## 2020-09-12 NOTE — Progress Notes (Signed)
Daily Session Note  Patient Details  Name: Devon Sanchez MRN: [id number] Date of Birth: 09-04-1950 Referring Provider:   Flowsheet Row CARDIAC REHAB PHASE II ORIENTATION from 07/01/2020 in [place]  Referring Provider Dr. Orvan Seen      Encounter Date: 09/12/2020  Check In:  Session Check In - 09/12/20 1100      Check-In   Supervising physician immediately available to respond to emergencies [facility] MD immediately available    Physician(s) Dr. Johnsie Cancel    Location [facility]    Staff Present Cathren Harsh, MS, Exercise Physiologist;Konnor Jorden Kris Mouton, MS, ACSM-CEP, Exercise Physiologist    Virtual Visit No    Medication changes reported     No    Fall or balance concerns reported    No    Tobacco Cessation No Change    Warm-up and Cool-down Performed as group-led instruction    Resistance Training Performed Yes    VAD Patient? No    PAD/SET Patient? No      Pain Assessment   Currently in Pain? No/denies    Pain Score 0-No pain    Multiple Pain Sites No           Capillary Blood Glucose: No results found for this or any previous visit (from the past 24 hour(s)).    Social History   Tobacco Use  Smoking Status Never Smoker  Smokeless Tobacco Never Used    Goals Met:  Independence with exercise equipment Exercise tolerated well No report of cardiac concerns or symptoms Strength training completed today  Goals Unmet:  Not Applicable  Comments: checkout time is 1200   Dr. Kathie Dike is Medical Director for [facility] Pulmonary Rehab.

## 2020-09-14 ENCOUNTER — Encounter (HOSPITAL_COMMUNITY)
Admission: RE | Admit: 2020-09-14 | Discharge: 2020-09-14 | Disposition: A | Discharge status: Home / Self Care | Payer: Medicare Other | Source: Ambulatory Visit | Attending: Cardiology | Admitting: Cardiology

## 2020-09-14 ENCOUNTER — Other Ambulatory Visit: Payer: Self-pay

## 2020-09-14 DIAGNOSIS — Z8679 Personal history of other diseases of the circulatory system: Secondary | ICD-10-CM

## 2020-09-14 DIAGNOSIS — Z952 Presence of prosthetic heart valve: Secondary | ICD-10-CM

## 2020-09-14 DIAGNOSIS — Z9889 Other specified postprocedural states: Secondary | ICD-10-CM | POA: Diagnosis not present

## 2020-09-14 NOTE — Progress Notes (Signed)
Daily Session Note  Patient Details  Name: Devon Sanchez MRN: [id number] Date of Birth: 07-23-1950 Referring Provider:   Flowsheet Row CARDIAC REHAB PHASE II ORIENTATION from 07/01/2020 in [place]  Referring Provider Dr. Orvan Seen      Encounter Date: 09/14/2020  Check In:  Session Check In - 09/14/20 1100      Check-In   Supervising physician immediately available to respond to emergencies [facility] MD immediately available    Physician(s) Dr. Johnsie Cancel    Location [facility]    Staff Present Cathren Harsh, MS, Exercise Physiologist;Debra Wynetta Emery, RN, BSN    Virtual Visit No    Medication changes reported     No    Fall or balance concerns reported    No    Tobacco Cessation No Change    Warm-up and Cool-down Performed as group-led instruction    Resistance Training Performed Yes    VAD Patient? No    PAD/SET Patient? No      Pain Assessment   Currently in Pain? No/denies    Pain Score 0-No pain    Multiple Pain Sites No           Capillary Blood Glucose: No results found for this or any previous visit (from the past 24 hour(s)).    Social History   Tobacco Use  Smoking Status Never Smoker  Smokeless Tobacco Never Used    Goals Met:  Independence with exercise equipment Exercise tolerated well No report of cardiac concerns or symptoms Strength training completed today  Goals Unmet:  Not Applicable  Comments: check out 1200   Dr. Kathie Dike is Medical Director for [facility] Pulmonary Rehab.

## 2020-09-16 ENCOUNTER — Encounter (HOSPITAL_COMMUNITY)
Admission: RE | Admit: 2020-09-16 | Discharge: 2020-09-16 | Disposition: A | Discharge status: Home / Self Care | Payer: Medicare Other | Source: Ambulatory Visit | Attending: Cardiology | Admitting: Cardiology

## 2020-09-16 ENCOUNTER — Other Ambulatory Visit: Payer: Self-pay

## 2020-09-16 DIAGNOSIS — Z9889 Other specified postprocedural states: Secondary | ICD-10-CM | POA: Diagnosis not present

## 2020-09-16 DIAGNOSIS — Z8679 Personal history of other diseases of the circulatory system: Secondary | ICD-10-CM | POA: Diagnosis not present

## 2020-09-16 DIAGNOSIS — Z952 Presence of prosthetic heart valve: Secondary | ICD-10-CM | POA: Diagnosis not present

## 2020-09-16 NOTE — Progress Notes (Signed)
Daily Session Note  Patient Details  Name: Devon Sanchez MRN: [id number] Date of Birth: 1950-12-13 Referring Provider:   Flowsheet Row CARDIAC REHAB PHASE II ORIENTATION from 07/01/2020 in [place]  Referring Provider Dr. Orvan Seen      Encounter Date: 09/16/2020  Check In:  Session Check In - 09/16/20 1100      Check-In   Supervising physician immediately available to respond to emergencies [facility] MD immediately available    Physician(s) Dr. Harrington Challenger    Location [facility]    Staff Present Cathren Harsh, MS, Exercise Physiologist;Dalton Kris Mouton, MS, ACSM-CEP, Exercise Physiologist    Virtual Visit No    Medication changes reported     No    Fall or balance concerns reported    No    Tobacco Cessation No Change    Warm-up and Cool-down Performed as group-led instruction    Resistance Training Performed Yes    VAD Patient? No    PAD/SET Patient? No      Pain Assessment   Currently in Pain? No/denies    Pain Score 0-No pain    Multiple Pain Sites No           Capillary Blood Glucose: No results found for this or any previous visit (from the past 24 hour(s)).    Social History   Tobacco Use  Smoking Status Never Smoker  Smokeless Tobacco Never Used    Goals Met:  Independence with exercise equipment Exercise tolerated well No report of cardiac concerns or symptoms Strength training completed today  Goals Unmet:  Not Applicable  Comments: check out 1200   Dr. Kathie Dike is Medical Director for [facility] Pulmonary Rehab.

## 2020-09-19 ENCOUNTER — Other Ambulatory Visit: Payer: Self-pay

## 2020-09-19 ENCOUNTER — Encounter (HOSPITAL_COMMUNITY)
Admission: RE | Admit: 2020-09-19 | Discharge: 2020-09-19 | Disposition: A | Discharge status: Home / Self Care | Payer: Medicare Other | Source: Ambulatory Visit | Attending: Cardiology | Admitting: Cardiology

## 2020-09-19 VITALS — Wt 243.4 lb

## 2020-09-19 DIAGNOSIS — Z8679 Personal history of other diseases of the circulatory system: Secondary | ICD-10-CM | POA: Diagnosis not present

## 2020-09-19 DIAGNOSIS — Z9889 Other specified postprocedural states: Secondary | ICD-10-CM | POA: Diagnosis not present

## 2020-09-19 DIAGNOSIS — Z952 Presence of prosthetic heart valve: Secondary | ICD-10-CM

## 2020-09-19 NOTE — Progress Notes (Signed)
Daily Session Note  Patient Details  Name: Devon Sanchez MRN: [id number] Date of Birth: December 27, 1950 Referring Provider:   Flowsheet Row CARDIAC REHAB PHASE II ORIENTATION from 07/01/2020 in [place]  Referring Provider Dr. Orvan Seen      Encounter Date: 09/19/2020  Check In:  Session Check In - 09/19/20 1100      Check-In   Supervising physician immediately available to respond to emergencies [facility] MD immediately available    Physician(s) Dr. Harl Bowie    Location [facility]    Staff Present Cathren Harsh, MS, Exercise Physiologist;Dalton Kris Mouton, MS, ACSM-CEP, Exercise Physiologist    Virtual Visit No    Medication changes reported     No    Fall or balance concerns reported    No    Tobacco Cessation No Change    Warm-up and Cool-down Performed as group-led instruction    Resistance Training Performed Yes    VAD Patient? No    PAD/SET Patient? No      Pain Assessment   Currently in Pain? No/denies    Pain Score 0-No pain    Multiple Pain Sites No           Capillary Blood Glucose: No results found for this or any previous visit (from the past 24 hour(s)).    Social History   Tobacco Use  Smoking Status Never Smoker  Smokeless Tobacco Never Used    Goals Met:  Independence with exercise equipment Exercise tolerated well No report of cardiac concerns or symptoms Strength training completed today  Goals Unmet:  Not Applicable  Comments: check out 1200   Dr. Kathie Dike is Medical Director for [facility] Pulmonary Rehab.

## 2020-09-20 ENCOUNTER — Telehealth: Payer: Self-pay

## 2020-09-20 NOTE — Telephone Encounter (Signed)
Patient contacted the office concerned about a "depression" where an incision was s/p [place] with Dr. Orvan Seen 05/2020.  Stated he just wanted to know if it was ok and if it was normal.  Patient sent picture to office Rn phone.  Incision sites looked to be healing nicely and patient advised that this is normal, nothing to be worried about at this time.  She acknowledged receipt.

## 2020-09-21 ENCOUNTER — Encounter (HOSPITAL_COMMUNITY)
Admission: RE | Admit: 2020-09-21 | Discharge: 2020-09-21 | Disposition: A | Discharge status: Home / Self Care | Payer: Medicare Other | Source: Ambulatory Visit | Attending: Cardiology | Admitting: Cardiology

## 2020-09-21 ENCOUNTER — Other Ambulatory Visit: Payer: Self-pay

## 2020-09-21 DIAGNOSIS — Z9889 Other specified postprocedural states: Secondary | ICD-10-CM | POA: Diagnosis not present

## 2020-09-21 DIAGNOSIS — Z952 Presence of prosthetic heart valve: Secondary | ICD-10-CM

## 2020-09-21 DIAGNOSIS — Z8679 Personal history of other diseases of the circulatory system: Secondary | ICD-10-CM

## 2020-09-21 NOTE — Progress Notes (Signed)
Daily Session Note  Patient Details  Name: Devon Sanchez MRN: [id number] Date of Birth: 09/11/1950 Referring Provider:   Flowsheet Row CARDIAC REHAB PHASE II ORIENTATION from 07/01/2020 in [place]  Referring Provider Dr. Orvan Seen      Encounter Date: 09/21/2020  Check In:  Session Check In - 09/21/20 1100      Check-In   Supervising physician immediately available to respond to emergencies [facility] MD immediately available    Physician(s) Dr. Harl Bowie    Staff Present Cathren Harsh, MS, Exercise Physiologist;Dalton Kris Mouton, MS, ACSM-CEP, Exercise Physiologist    Virtual Visit No    Medication changes reported     No    Fall or balance concerns reported    No    Tobacco Cessation No Change    Warm-up and Cool-down Performed as group-led instruction    Resistance Training Performed Yes    VAD Patient? No    PAD/SET Patient? No      Pain Assessment   Currently in Pain? No/denies    Pain Score 0-No pain    Multiple Pain Sites No           Capillary Blood Glucose: No results found for this or any previous visit (from the past 24 hour(s)).    Social History   Tobacco Use  Smoking Status Never Smoker  Smokeless Tobacco Never Used    Goals Met:  Independence with exercise equipment Exercise tolerated well No report of cardiac concerns or symptoms Strength training completed today  Goals Unmet:  Not Applicable  Comments: check out 1200   Dr. Kathie Dike is Medical Director for [facility] Pulmonary Rehab.

## 2020-09-23 ENCOUNTER — Encounter (HOSPITAL_COMMUNITY)
Admission: RE | Admit: 2020-09-23 | Discharge: 2020-09-23 | Disposition: A | Discharge status: Home / Self Care | Payer: Medicare Other | Source: Ambulatory Visit | Attending: Cardiology | Admitting: Cardiology

## 2020-09-23 ENCOUNTER — Other Ambulatory Visit: Payer: Self-pay

## 2020-09-23 VITALS — Ht 74.0 in | Wt 244.3 lb

## 2020-09-23 DIAGNOSIS — Z952 Presence of prosthetic heart valve: Secondary | ICD-10-CM | POA: Diagnosis not present

## 2020-09-23 DIAGNOSIS — Z8679 Personal history of other diseases of the circulatory system: Secondary | ICD-10-CM

## 2020-09-23 DIAGNOSIS — Z9889 Other specified postprocedural states: Secondary | ICD-10-CM | POA: Diagnosis not present

## 2020-09-23 NOTE — Progress Notes (Signed)
Daily Session Note  Patient Details  Name: Devon Sanchez MRN: [id number] Date of Birth: 09-07-1950 Referring Provider:   Flowsheet Row CARDIAC REHAB PHASE II ORIENTATION from 07/01/2020 in [place]  Referring Provider Dr. Orvan Seen      Encounter Date: 09/23/2020  Check In:  Session Check In - 09/23/20 1100      Check-In   Supervising physician immediately available to respond to emergencies [facility] MD immediately available    Physician(s) Dr. Domenic Polite    Location [facility]    Staff Present Cathren Harsh, MS, Exercise Physiologist;Debra Wynetta Emery, RN, BSN    Virtual Visit No    Medication changes reported     No    Fall or balance concerns reported    No    Tobacco Cessation No Change    Warm-up and Cool-down Performed as group-led instruction    Resistance Training Performed Yes    VAD Patient? No    PAD/SET Patient? No      Pain Assessment   Currently in Pain? No/denies    Pain Score 0-No pain    Multiple Pain Sites No           Capillary Blood Glucose: No results found for this or any previous visit (from the past 24 hour(s)).    Social History   Tobacco Use  Smoking Status Never Smoker  Smokeless Tobacco Never Used    Goals Met:  Independence with exercise equipment Exercise tolerated well No report of cardiac concerns or symptoms Strength training completed today  Goals Unmet:  Not Applicable  Comments: check out 1200   Dr. Kathie Dike is Medical Director for [facility] Pulmonary Rehab.

## 2020-09-26 NOTE — Progress Notes (Signed)
Discharge Progress Report  Patient Details  Name: DEONTE OTTING MRN: [id number] Date of Birth: [birth year] Referring Provider:   Flowsheet Row CARDIAC REHAB PHASE II ORIENTATION from 07/01/2020 in [place]  Referring Provider Dr. Orvan Seen       Number of Visits: 36  Reason for Discharge:  Patient reached a stable level of exercise. Patient independent in their exercise. Patient has met program and personal goals.  Smoking History:  Social History   Tobacco Use  Smoking Status Never Smoker  Smokeless Tobacco Never Used    Diagnosis:  S/P aortic aneurysm repair  S/P TAVR (transcatheter aortic valve replacement)  ADL [facility]:   Initial Exercise Prescription:  Initial Exercise Prescription - 07/01/20 1400      Date of Initial Exercise RX and Referring Provider   Date 07/01/20    Referring Provider Dr. Orvan Seen    Expected Discharge Date 09/23/20      Treadmill   MPH 1.5    Grade 0    Minutes 22      Recumbant Elliptical   Level 1    RPM 60    Minutes 17      Intensity   THRR 40-80% of Max Heartrate 61-122    Ratings of Perceived Exertion 11-13      Resistance Training   Training Prescription Yes    Weight 3 lbs    Reps 10-15           Discharge Exercise Prescription (Final Exercise Prescription Changes):  Exercise Prescription Changes - 09/19/20 1300      Response to Exercise   Blood Pressure (Admit) 96/70    Blood Pressure (Exercise) 140/72    Blood Pressure (Exit) 92/56    Heart Rate (Admit) 66 bpm    Heart Rate (Exercise) 122 bpm    Heart Rate (Exit) 80 bpm    Rating of Perceived Exertion (Exercise) 11    Duration Continue with 30 min of aerobic exercise without signs/symptoms of physical distress.    Intensity THRR unchanged      Progression   Progression Continue to progress workloads to maintain intensity without signs/symptoms of physical distress.      Resistance Training   Training Prescription Yes    Weight 8  lbs    Reps 10-15    Time 10 Minutes      Treadmill   MPH 3    Grade 3.5    Minutes 22    METs 4.73      Recumbant Elliptical   Level 5    RPM 79    Minutes 17    METs 4.6           Functional Capacity:  6 Minute Walk    Row Name 07/01/20 1419 09/23/20 1409       6 Minute Walk   Phase Initial Discharge    Distance 1375 feet 1775 feet    Walk Time 6 minutes 6 minutes    # of Rest Breaks 0 0    MPH 2.6 3.36    METS 3.22 3.93    RPE 10 8    VO2 Peak 11.29 13.77    Symptoms Yes (comment) No    Comments 2/10 hip pain in both hips --    Resting HR 99 bpm 68 bpm    Resting BP 98/70 110/56    Resting Oxygen Saturation  94 % 97 %    Exercise Oxygen Saturation  during 6 min walk 93 % 95 %  Max Ex. HR 126 bpm 117 bpm    Max Ex. BP 120/76 136/70    2 Minute Post BP 110/80 128/74           Psychological, QOL, Others - Outcomes: PHQ 2/9: Depression screen [facility] 2/9 09/26/2020 07/01/2020  Decreased Interest 0 -  Down, Depressed, Hopeless 0 1  PHQ - 2 Score 0 1  Altered sleeping 0 2  Tired, decreased energy 1 0  Change in appetite 1 0  Feeling bad or failure about yourself  1 0  Trouble concentrating 0 0  Moving slowly or fidgety/restless - 0  Suicidal thoughts 0 0  PHQ-9 Score 3 3  Difficult doing work/chores Somewhat difficult Somewhat difficult    Quality of Life:  Quality of Life - 09/26/20 0911      Quality of Life   Select Quality of Life      Quality of Life Scores   Health/Function Pre 26.46 %    Health/Function Post 26.65 %    Health/Function % Change 0.72 %    Socioeconomic Pre 27.5 %    Socioeconomic Post 29 %    Socioeconomic % Change  5.45 %    Psych/Spiritual Pre 29.14 %    Psych/Spiritual Post 27.93 %    Psych/Spiritual % Change -4.15 %    Family Pre 28.8 %    Family Post 28.8 %    Family % Change 0 %    GLOBAL Pre 27.61 %    GLOBAL Post 27.74 %    GLOBAL % Change 0.47 %           Personal Goals: Goals established at  orientation with interventions provided to work toward goal.  Personal Goals and Risk Factors at Admission - 07/01/20 1443      Core Components/Risk Factors/Patient Goals on Admission    Weight Management Obesity    Personal Goal Other Yes    Personal Goal Get stronger; return to doing his ADL's; change his eating habits.    Intervention Provide education on lifestye changes and risk modification and monitored, supervised exercise.    Expected Outcomes Patient will meet his personal and program goals.            Personal Goals Discharge:  Goals and Risk Factor Review    Row Name 07/06/20 0751 08/31/20 0919 09/26/20 1029         Core Components/Risk Factors/Patient Goals Review   Personal Goals Review Weight Management/Obesity;Other Weight Management/Obesity;Other Weight Management/Obesity;Other     Review Patient is new to the program completing 2 sessions. 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 He has multiple risk factors for CAD and is doing the program for risk modification. He has been through the program before in [place] secondary to NSTEMI. His personal goals for the program are to get stronger and to be able to return to his ADL's and eat heatlhier. Will continue to monitor for progress. Patient has completed 25 sessions and is doing well in the program with consistent attendance and progression losing 1 lb since last 30 day review. He works very hard during his sessions and says he is getting stronger and is exercising on the days he does not come to rehab. His personal goals for the program are to get stronger; return to his ADL's and eat healthier. We will continue to monitor as he works toward meeting these goals. Patient completed 36 sessions before discharge. He did very well  in the program. He gained about 5 lbs since he has started in the program. His goals are to get stronger, return to ADLs and eat healthier. He has  gotten stronger throughout the program and he reports ADLs are easier. He is going to keep working on eating healthier outside of the program.     Expected Outcomes Patient will complete the program meeting both personal and program goals. Patient will complete the program meeting both personal and program goals. --            Exercise Goals and Review:  Exercise Goals    Row Name 07/01/20 1424 07/11/20 1132 08/08/20 1208 09/05/20 1200       Exercise Goals   Increase Physical Activity Yes Yes Yes Yes    Intervention Provide advice, education, support and counseling about physical activity/exercise needs.;Develop an individualized exercise prescription for aerobic and resistive training based on initial evaluation findings, risk stratification, comorbidities and participant's personal goals. Provide advice, education, support and counseling about physical activity/exercise needs.;Develop an individualized exercise prescription for aerobic and resistive training based on initial evaluation findings, risk stratification, comorbidities and participant's personal goals. Provide advice, education, support and counseling about physical activity/exercise needs.;Develop an individualized exercise prescription for aerobic and resistive training based on initial evaluation findings, risk stratification, comorbidities and participant's personal goals. Provide advice, education, support and counseling about physical activity/exercise needs.;Develop an individualized exercise prescription for aerobic and resistive training based on initial evaluation findings, risk stratification, comorbidities and participant's personal goals.    Expected Outcomes Short Term: Attend rehab on a regular basis to increase amount of physical activity.;Long Term: Add in home exercise to make exercise part of routine and to increase amount of physical activity.;Long Term: Exercising regularly at least 3-5 days a week. Short Term: Attend  rehab on a regular basis to increase amount of physical activity.;Long Term: Add in home exercise to make exercise part of routine and to increase amount of physical activity.;Long Term: Exercising regularly at least 3-5 days a week. Short Term: Attend rehab on a regular basis to increase amount of physical activity.;Long Term: Add in home exercise to make exercise part of routine and to increase amount of physical activity.;Long Term: Exercising regularly at least 3-5 days a week. Short Term: Attend rehab on a regular basis to increase amount of physical activity.;Long Term: Add in home exercise to make exercise part of routine and to increase amount of physical activity.;Long Term: Exercising regularly at least 3-5 days a week.    Increase Strength and Stamina Yes Yes Yes Yes    Intervention Provide advice, education, support and counseling about physical activity/exercise needs.;Develop an individualized exercise prescription for aerobic and resistive training based on initial evaluation findings, risk stratification, comorbidities and participant's personal goals. Provide advice, education, support and counseling about physical activity/exercise needs.;Develop an individualized exercise prescription for aerobic and resistive training based on initial evaluation findings, risk stratification, comorbidities and participant's personal goals. Provide advice, education, support and counseling about physical activity/exercise needs.;Develop an individualized exercise prescription for aerobic and resistive training based on initial evaluation findings, risk stratification, comorbidities and participant's personal goals. Provide advice, education, support and counseling about physical activity/exercise needs.;Develop an individualized exercise prescription for aerobic and resistive training based on initial evaluation findings, risk stratification, comorbidities and participant's personal goals.    Expected Outcomes  Short Term: Increase workloads from initial exercise prescription for resistance, speed, and METs.;Short Term: Perform resistance training exercises routinely during rehab and add in resistance training  at home;Long Term: Improve cardiorespiratory fitness, muscular endurance and strength as measured by increased METs and functional capacity (6MWT) Short Term: Increase workloads from initial exercise prescription for resistance, speed, and METs.;Short Term: Perform resistance training exercises routinely during rehab and add in resistance training at home;Long Term: Improve cardiorespiratory fitness, muscular endurance and strength as measured by increased METs and functional capacity (6MWT) Short Term: Increase workloads from initial exercise prescription for resistance, speed, and METs.;Short Term: Perform resistance training exercises routinely during rehab and add in resistance training at home;Long Term: Improve cardiorespiratory fitness, muscular endurance and strength as measured by increased METs and functional capacity (6MWT) Short Term: Increase workloads from initial exercise prescription for resistance, speed, and METs.;Short Term: Perform resistance training exercises routinely during rehab and add in resistance training at home;Long Term: Improve cardiorespiratory fitness, muscular endurance and strength as measured by increased METs and functional capacity (6MWT)    Able to understand and use rate of perceived exertion (RPE) scale Yes Yes Yes Yes    Intervention Provide education and explanation on how to use RPE scale Provide education and explanation on how to use RPE scale Provide education and explanation on how to use RPE scale Provide education and explanation on how to use RPE scale    Expected Outcomes Short Term: Able to use RPE daily in rehab to express subjective intensity level;Long Term:  Able to use RPE to guide intensity level when exercising independently Short Term: Able to use RPE  daily in rehab to express subjective intensity level;Long Term:  Able to use RPE to guide intensity level when exercising independently Short Term: Able to use RPE daily in rehab to express subjective intensity level;Long Term:  Able to use RPE to guide intensity level when exercising independently Short Term: Able to use RPE daily in rehab to express subjective intensity level;Long Term:  Able to use RPE to guide intensity level when exercising independently    Knowledge and understanding of Target Heart Rate Range (THRR) Yes Yes Yes Yes    Intervention Provide education and explanation of THRR including how the numbers were predicted and where they are located for reference Provide education and explanation of THRR including how the numbers were predicted and where they are located for reference Provide education and explanation of THRR including how the numbers were predicted and where they are located for reference Provide education and explanation of THRR including how the numbers were predicted and where they are located for reference    Expected Outcomes Short Term: Able to state/look up THRR;Long Term: Able to use THRR to govern intensity when exercising independently;Short Term: Able to use daily as guideline for intensity in rehab Short Term: Able to state/look up THRR;Long Term: Able to use THRR to govern intensity when exercising independently;Short Term: Able to use daily as guideline for intensity in rehab Short Term: Able to state/look up THRR;Long Term: Able to use THRR to govern intensity when exercising independently;Short Term: Able to use daily as guideline for intensity in rehab Short Term: Able to state/look up THRR;Long Term: Able to use THRR to govern intensity when exercising independently;Short Term: Able to use daily as guideline for intensity in rehab    Able to check pulse independently Yes Yes Yes Yes    Intervention Provide education and demonstration on how to check pulse in  carotid and radial arteries.;Review the importance of being able to check your own pulse for safety during independent exercise Provide education and demonstration on how to  check pulse in carotid and radial arteries.;Review the importance of being able to check your own pulse for safety during independent exercise Provide education and demonstration on how to check pulse in carotid and radial arteries.;Review the importance of being able to check your own pulse for safety during independent exercise Provide education and demonstration on how to check pulse in carotid and radial arteries.;Review the importance of being able to check your own pulse for safety during independent exercise    Expected Outcomes Short Term: Able to explain why pulse checking is important during independent exercise;Long Term: Able to check pulse independently and accurately Short Term: Able to explain why pulse checking is important during independent exercise;Long Term: Able to check pulse independently and accurately Short Term: Able to explain why pulse checking is important during independent exercise;Long Term: Able to check pulse independently and accurately Short Term: Able to explain why pulse checking is important during independent exercise;Long Term: Able to check pulse independently and accurately    Understanding of Exercise Prescription Yes Yes Yes Yes    Intervention Provide education, explanation, and written materials on patient's individual exercise prescription Provide education, explanation, and written materials on patient's individual exercise prescription Provide education, explanation, and written materials on patient's individual exercise prescription Provide education, explanation, and written materials on patient's individual exercise prescription    Expected Outcomes Short Term: Able to explain program exercise prescription;Long Term: Able to explain home exercise prescription to exercise independently  Short Term: Able to explain program exercise prescription;Long Term: Able to explain home exercise prescription to exercise independently Short Term: Able to explain program exercise prescription;Long Term: Able to explain home exercise prescription to exercise independently Short Term: Able to explain program exercise prescription;Long Term: Able to explain home exercise prescription to exercise independently           Exercise Goals Re-Evaluation:  Exercise Goals Re-Evaluation    Row Name 07/11/20 1132 08/08/20 1208 09/05/20 1200 09/26/20 1031       Exercise Goal Re-Evaluation   Exercise Goals Review Increase Physical Activity;Increase Strength and Stamina;Able to understand and use rate of perceived exertion (RPE) scale;Knowledge and understanding of Target Heart Rate Range (THRR);Able to check pulse independently;Understanding of Exercise Prescription Increase Physical Activity;Increase Strength and Stamina;Able to understand and use rate of perceived exertion (RPE) scale;Knowledge and understanding of Target Heart Rate Range (THRR);Able to check pulse independently;Understanding of Exercise Prescription Increase Physical Activity;Increase Strength and Stamina;Able to understand and use rate of perceived exertion (RPE) scale;Knowledge and understanding of Target Heart Rate Range (THRR);Able to check pulse independently;Understanding of Exercise Prescription Increase Physical Activity;Increase Strength and Stamina;Able to understand and use rate of perceived exertion (RPE) scale;Knowledge and understanding of Target Heart Rate Range (THRR);Able to check pulse independently;Understanding of Exercise Prescription    Comments Pt has attended 3 exercise sessions. He is motivated and will likely progress quickly in the program. He currently exercises at 3.0 METs on the elliptical. Will continue to monitor and progress as able. Patient has completed 15 exercise sessions. He has progressed very well with  exercise intensities. He is very eager to learn about exercise, nutrition, and how to live a healthy lifestyle. He continues to inquire about home exercise and how to do it safely. He is currently exercising at home by walking every day he is not in rehab. He is learning how to monitor his heart rate and intensities. He is currently exercising at 3.3 METs on the treadmill at rehab. Will continue to monitor and progress  as able. patient has completed 27 exercise sessions. He has progressed well in the program so far. He is tolerating increased intensities well. He is exercising at home by wals[place] 30-40 minutes per day. He asks questions frequently about exercise to learn how to exercise at home safely. He is a joy to work with and always has a positive attitude. He is currently exercising at 4.3 METs on the elliptical. Will continue to monitor and progress as able. Patient had completed 36 sessions before discharge. He did very well in the program. He progressed well and increased intensities regularly. He is going to continue walking and exercising at home. He improved on his walk test by 400 ft. He was great to work with and was always eager to come to rehab and ask questions.    Expected Outcomes Through exercise at rehab and by engaging in a home exercise program the patient will reach their goals. Through exercise at rehab and by engaging in a home exercise program the patient will reach their goals. Through exercise at rehab and by engaging in a home exercise program the patient will reach their goals. --           Nutrition & Weight - Outcomes:  Pre Biometrics - 09/23/20 1410      Pre Biometrics   Height '6\' 2"'  (1.88 m)    Weight 110.8 kg    Waist Circumference 42.5 inches    Hip Circumference 41.75 inches    Waist to Hip Ratio 1.02 %    BMI (Calculated) 31.35    Triceps Skinfold 11 mm    % Body Fat 28.1 %    Grip Strength 30 kg    Flexibility 18 in    Single Leg Stand 42 seconds             Nutrition:  Nutrition Therapy & Goals - 08/01/20 1242      Personal Nutrition Goals   Comments We will continue to provide heart healthy nutritional education through handouts.      Intervention Plan   Intervention Nutrition handout(s) given to patient.           Nutrition Discharge:  Nutrition Assessments - 09/26/20 0911      MEDFICTS Scores   Pre Score 33    Post Score 54    Score Difference 21           Education Questionnaire Score:  Knowledge Questionnaire Score - 09/26/20 0911      Knowledge Questionnaire Score   Pre Score 22/24    Post Score 23/24          Patient graduated from [facility] today on 09-23-20 after completing 36 sessions. They achieved LTG of 30 minutes of aerobic exercise at Max Met level of 4.73. All patients vitals are WNL. Discharge instruction has been reviewed in detail and patient stated an understanding of material given. Patient plans to exercise at home. Cardiac Rehab staff will make f/u call. Patient had no complaints of any abnormal S/S or pain on their exit visit.     Goals reviewed with patient; copy given to patient.

## 2020-10-06 DIAGNOSIS — Z86010 Personal history of colonic polyps: Secondary | ICD-10-CM | POA: Diagnosis not present

## 2020-10-06 DIAGNOSIS — D649 Anemia, unspecified: Secondary | ICD-10-CM | POA: Diagnosis not present

## 2020-10-06 DIAGNOSIS — K449 Diaphragmatic hernia without obstruction or gangrene: Secondary | ICD-10-CM | POA: Diagnosis not present

## 2020-12-01 DIAGNOSIS — E1165 Type 2 diabetes mellitus with hyperglycemia: Secondary | ICD-10-CM | POA: Diagnosis not present

## 2020-12-01 DIAGNOSIS — I10 Essential (primary) hypertension: Secondary | ICD-10-CM | POA: Diagnosis not present

## 2020-12-13 DIAGNOSIS — E782 Mixed hyperlipidemia: Secondary | ICD-10-CM | POA: Diagnosis not present

## 2020-12-13 DIAGNOSIS — R7301 Impaired fasting glucose: Secondary | ICD-10-CM | POA: Diagnosis not present

## 2020-12-15 DIAGNOSIS — N189 Chronic kidney disease, unspecified: Secondary | ICD-10-CM | POA: Diagnosis not present

## 2020-12-15 DIAGNOSIS — Z0001 Encounter for general adult medical examination with abnormal findings: Secondary | ICD-10-CM | POA: Diagnosis not present

## 2020-12-15 DIAGNOSIS — R7301 Impaired fasting glucose: Secondary | ICD-10-CM | POA: Diagnosis not present

## 2020-12-15 DIAGNOSIS — D509 Iron deficiency anemia, unspecified: Secondary | ICD-10-CM | POA: Diagnosis not present

## 2021-01-02 DIAGNOSIS — I10 Essential (primary) hypertension: Secondary | ICD-10-CM | POA: Diagnosis not present

## 2021-01-02 DIAGNOSIS — R7301 Impaired fasting glucose: Secondary | ICD-10-CM | POA: Diagnosis not present

## 2021-01-10 DIAGNOSIS — I712 Thoracic aortic aneurysm, without rupture: Secondary | ICD-10-CM | POA: Diagnosis not present

## 2021-01-10 DIAGNOSIS — I129 Hypertensive chronic kidney disease with stage 1 through stage 4 chronic kidney disease, or unspecified chronic kidney disease: Secondary | ICD-10-CM | POA: Diagnosis not present

## 2021-01-10 DIAGNOSIS — Z952 Presence of prosthetic heart valve: Secondary | ICD-10-CM | POA: Diagnosis not present

## 2021-01-10 DIAGNOSIS — R5383 Other fatigue: Secondary | ICD-10-CM | POA: Diagnosis not present

## 2021-01-10 DIAGNOSIS — I6522 Occlusion and stenosis of left carotid artery: Secondary | ICD-10-CM | POA: Diagnosis not present

## 2021-01-10 DIAGNOSIS — I2510 Atherosclerotic heart disease of native coronary artery without angina pectoris: Secondary | ICD-10-CM | POA: Diagnosis not present

## 2021-01-10 DIAGNOSIS — G4733 Obstructive sleep apnea (adult) (pediatric): Secondary | ICD-10-CM | POA: Diagnosis not present

## 2021-01-10 DIAGNOSIS — E785 Hyperlipidemia, unspecified: Secondary | ICD-10-CM | POA: Diagnosis not present

## 2021-01-10 DIAGNOSIS — R7301 Impaired fasting glucose: Secondary | ICD-10-CM | POA: Diagnosis not present

## 2021-01-10 DIAGNOSIS — D509 Iron deficiency anemia, unspecified: Secondary | ICD-10-CM | POA: Diagnosis not present

## 2021-01-10 DIAGNOSIS — N182 Chronic kidney disease, stage 2 (mild): Secondary | ICD-10-CM | POA: Diagnosis not present

## 2021-01-25 ENCOUNTER — Other Ambulatory Visit: Payer: Self-pay | Admitting: Cardiology

## 2021-02-23 ENCOUNTER — Ambulatory Visit: Payer: Medicare Other | Admitting: Cardiology

## 2021-03-02 DIAGNOSIS — Z20822 Contact with and (suspected) exposure to covid-19: Secondary | ICD-10-CM | POA: Diagnosis not present

## 2021-03-07 ENCOUNTER — Ambulatory Visit: Payer: Medicare Other | Admitting: Cardiology

## 2021-03-30 ENCOUNTER — Other Ambulatory Visit: Payer: Self-pay

## 2021-03-30 ENCOUNTER — Encounter: Payer: Self-pay | Admitting: Student

## 2021-03-30 ENCOUNTER — Ambulatory Visit: Payer: Medicare Other | Admitting: Student

## 2021-03-30 VITALS — BP 115/73 | HR 71 | Temp 98.0°F | Ht 74.0 in | Wt 255.0 lb

## 2021-03-30 DIAGNOSIS — I6522 Occlusion and stenosis of left carotid artery: Secondary | ICD-10-CM | POA: Diagnosis not present

## 2021-03-30 DIAGNOSIS — I10 Essential (primary) hypertension: Secondary | ICD-10-CM | POA: Diagnosis not present

## 2021-03-30 DIAGNOSIS — I2510 Atherosclerotic heart disease of native coronary artery without angina pectoris: Secondary | ICD-10-CM | POA: Diagnosis not present

## 2021-03-30 DIAGNOSIS — Z952 Presence of prosthetic heart valve: Secondary | ICD-10-CM | POA: Diagnosis not present

## 2021-03-30 NOTE — Progress Notes (Signed)
Primary Physician/Referring:  Celene Squibb, MD  Patient ID: Devon Sanchez, male    DOB: [birth year], 70 y.o.   MRN: [id number]  Chief Complaint  Patient presents with   AAA   aortic valve replacemenT   HPI:    Devon Sanchez  is a 71 y.o. Caucasian male  with CAD S/P Prox LAD stent in 2007 and again  on 04/20/2013 underwent stenting to the proximal and mid LAD and proximal and mid circumflex. 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  Patient presents for 76-month follow-up.  Last office visit patient was stable from a cardiovascular standpoint and no changes were made.  He does have carotid artery stenosis for which annual surveillance is appropriate, will need repeat carotid artery duplex in 06/2021.  Patient is feeling well overall.  He has increased his physical activity around the house although he still does not have a formal exercise routine.  He continues to struggle with weight loss, in fact has gained an additional 12 pounds since last office visit.  He admits to dietary noncompliance and late-night eating.  Denies chest pain, palpitations, dyspnea, syncope, near syncope, chest pain.  Past Medical History:  Diagnosis Date   Anxiety    Aortic root dilatation (HCC)    Atherosclerosis    Bicuspid aortic valve 07/25/2018   Coronary artery disease    Depression    Dizziness and giddiness    Erectile dysfunction    Hypercholesterolemia    Hyperglycemia    Hyperlipidemia    Hypertension    MI (myocardial infarction) ([place])    Midsystolic murmur    bicuspid AV with moderate AS (08/2019)   Reflux    Sleep apnea    Thoracic ascending aortic aneurysm    Past Surgical History:  Procedure  Laterality Date   BENTALL PROCEDURE N/A 05/12/2020   Procedure: BENTALL PROCEDURE;  Surgeon: Wonda Olds, MD;  Location: [facility];  Service: Open Heart Surgery;  Laterality: N/A;   COLONOSCOPY N/A 11/04/2014   Procedure: COLONOSCOPY;  Surgeon: Rogene Houston, MD;  Location[facility];  Service: Endoscopy;  Laterality: N/A;  830 - moved to 6/2 @ 10:30   CORONARY ANGIOPLASTY WITH STENT PLACEMENT     LEFT HEART CATHETERIZATION WITH CORONARY ANGIOGRAM N/A 04/20/2013   Procedure: LEFT HEART CATHETERIZATION WITH CORONARY ANGIOGRAM;  Surgeon: Laverda Page, MD;  Location: [facility];  Service: Cardiovascular;  Laterality: N/A;   RIGHT/LEFT HEART CATH AND CORONARY ANGIOGRAPHY N/A 11/10/2019   Procedure: RIGHT/LEFT HEART CATH AND CORONARY ANGIOGRAPHY;  Surgeon: Nigel Mormon, MD;  Location: [facility] CV [facility];  Service: Cardiovascular;  Laterality: N/A;   TEE WITHOUT CARDIOVERSION N/A 05/12/2020   Procedure: TRANSESOPHAGEAL ECHOCARDIOGRAM (TEE);  Surgeon: Wonda Olds, MD;  Location: [facility];  Service: Open Heart Surgery;  Laterality: N/A;   Family History  Problem Relation Age of Onset   Failure to thrive Mother    Diabetes Mother    Heart attack Father    Prostate cancer Father    Heart failure Father    Heart disease Father     Social History   Tobacco Use   Smoking status: Never   Smokeless tobacco: Never  Substance Use  Topics   Alcohol use: No   ROS  Review of Systems  Constitutional: Positive for weight gain. Negative for malaise/fatigue.  Cardiovascular:  Negative for chest pain, claudication, dyspnea on exertion, leg swelling, near-syncope, orthopnea, palpitations, paroxysmal nocturnal dyspnea and syncope.  Respiratory:  Negative for shortness of breath.   Gastrointestinal:  Negative for melena.  Neurological:  Negative for dizziness.  Objective  Blood pressure 115/73, pulse 71, temperature 98 F (36.7 C), temperature source Temporal, height 6\' 2"  (1.88 m),  weight 255 lb (115.7 kg), SpO2 96 %.  Vitals with BMI 03/30/2021 09/23/2020 09/19/2020  Height 6\' 2"  6\' 2"  -  Weight 255 lbs 244 lbs 4 oz 243 lbs 6 oz  BMI 73.53 29.92 -  Systolic 426 - -  Diastolic 73 - -  Pulse 71 - -     Physical Exam Vitals reviewed.  Constitutional:      General: He is not in acute distress.    Appearance: He is obese.     Comments: Well-built and mildly to moderately obese  Cardiovascular:     Rate and Rhythm: Normal rate and regular rhythm.     Pulses: Normal pulses and intact distal pulses.          Carotid pulses are 2+ on the right side with bruit and 2+ on the left side with bruit.    Heart sounds: S1 normal and S2 normal. No murmur heard.   No gallop.     Comments: No JVD.  Pulmonary:     Effort: Pulmonary effort is normal. No respiratory distress.     Breath sounds: Normal breath sounds. No wheezing, rhonchi or rales.  Chest:     Comments: Sternotomy scar noted Abdominal:     General: Bowel sounds are normal.     Palpations: Abdomen is soft.     Tenderness: There is no rebound.  Musculoskeletal:     Right lower leg: No edema.     Left lower leg: No edema.  Neurological:     Mental Status: He is alert.   Laboratory examination:   CMP Latest Ref Rng & Units 05/19/2020 05/18/2020 05/17/2020  Glucose 70 - 99 mg/dL 101(H) 106(H) 116(H)  BUN 8 - 23 mg/dL 23 28(H) 25(H)  Creatinine 0.61 - 1.24 mg/dL 1.22 1.28(H) 1.16  Sodium 135 - 145 mmol/L 135 134(L) 133(L)  Potassium 3.5 - 5.1 mmol/L 4.2 4.1 4.0  Chloride 98 - 111 mmol/L 98 95(L) 95(L)  CO2 22 - 32 mmol/L 29 27 27   Calcium 8.9 - 10.3 mg/dL 8.5(L) 8.6(L) 8.6(L)  Total Protein 6.5 - 8.1 g/dL - - 6.4(L)  Total Bilirubin 0.3 - 1.2 mg/dL - - 1.4(H)  Alkaline Phos 38 - 126 U/L - - 89  AST 15 - 41 U/L - - 36  ALT 0 - 44 U/L - - 38   CBC Latest Ref Rng & Units 05/18/2020 05/17/2020 05/16/2020  WBC 4.0 - 10.5 K/uL 6.8 6.8 7.2  Hemoglobin 13.0 - 17.0 g/dL 8.9(L) 9.4(L) 8.0(L)  Hematocrit 39.0 -  52.0 % 26.2(L) 26.4(L) 23.1(L)  Platelets 150 - 400 K/uL 175 164 150   Lipid Panel  No results found for: CHOL, TRIG, HDL, CHOLHDL, VLDL, LDLCALC, LDLDIRECT HEMOGLOBIN A1C Lab Results  Component Value Date   HGBA1C 5.8 (H) 05/09/2020   MPG 119.76 05/09/2020   TSH No results for input(s): TSH in the last 8760 hours.  External labs:  09/06/2020: HDL 36, LDL 70, total cholesterol 126, triglycerides 106 A1c 6.3% BUN  14, creatinine 1.07, GFR 60  Cholesterol, total 144.000 M 03/26/2019 HDL 37.000 MG 03/26/2019 LDL-C 70.000 MG 09/22/2018 Triglycerides 164.000 M 03/26/2019  A1C 5.800 % 03/26/2019; TSH 3.100 07/17/2016  Creatinine, Serum 1.490 MG/ 03/26/2019 Potassium 4.200 MM 01/10/2016 ALT (SGPT) 24.000 IU/ 03/26/2019  Hemoglobin 11.900 G/ 03/26/2019; Platelets 213.000 X 03/26/2019  Allergies   Allergies  Allergen Reactions   Bupropion Other (See Comments)    MADE CHEST HURT   Naproxen Sodium Other (See Comments)    GI BLEEDING    Medications Prior to Visit:   Outpatient Medications Prior to Visit  Medication Sig Dispense Refill   acetaminophen (TYLENOL) 500 MG tablet Take 500 mg by mouth every 6 (six) hours as needed for moderate pain or headache.      amoxicillin (AMOXIL) 500 MG capsule Take 4 capsules (2,000 mg total) by mouth once as needed for up to 1 dose (Dental work). 4 capsule 3   aspirin EC 325 MG EC tablet Take 1 tablet (325 mg total) by mouth daily.     carvedilol (COREG) 6.25 MG tablet TAKE 1 TABLET BY MOUTH  TWICE DAILY WITH A MEAL 180 tablet 3   escitalopram (LEXAPRO) 10 MG tablet Take 1 tablet (10 mg total) by mouth daily. (Patient taking differently: Take 20 mg by mouth daily.) 30 tablet 2   Multiple Vitamins-Minerals (CENTRUM SILVER PO) Take 1 tablet by mouth every evening.      nitroGLYCERIN (NITROSTAT) 0.4 MG SL tablet Place 1 tablet (0.4 mg total) under the tongue every 5 (five) minutes as needed for up to 25 days for chest pain. (Patient taking  differently: Place 0.4 mg under the tongue every 5 (five) minutes as needed for chest pain. Pt hasn't used in ~10 years, but keeps it on hand in case) 25 tablet 3   Omega-3 Fatty Acids (FISH OIL) 1200 MG CAPS Take 3 capsules by mouth at bedtime.     omeprazole (PRILOSEC) 20 MG capsule Take 1 capsule by mouth daily.     polyethylene glycol (MIRALAX / GLYCOLAX) 17 g packet Take 17 g by mouth daily.     rosuvastatin (CRESTOR) 20 MG tablet Take 1 tablet (20 mg total) by mouth daily.     No facility-administered medications prior to visit.   Final Medications at End of Visit    Current Meds  Medication Sig   acetaminophen (TYLENOL) 500 MG tablet Take 500 mg by mouth every 6 (six) hours as needed for moderate pain or headache.    amoxicillin (AMOXIL) 500 MG capsule Take 4 capsules (2,000 mg total) by mouth once as needed for up to 1 dose (Dental work).   aspirin EC 325 MG EC tablet Take 1 tablet (325 mg total) by mouth daily.   carvedilol (COREG) 6.25 MG tablet TAKE 1 TABLET BY MOUTH  TWICE DAILY WITH A MEAL   escitalopram (LEXAPRO) 10 MG tablet Take 1 tablet (10 mg total) by mouth daily. (Patient taking differently: Take 20 mg by mouth daily.)   Multiple Vitamins-Minerals (CENTRUM SILVER PO) Take 1 tablet by mouth every evening.    nitroGLYCERIN (NITROSTAT) 0.4 MG SL tablet Place 1 tablet (0.4 mg total) under the tongue every 5 (five) minutes as needed for up to 25 days for chest pain. (Patient taking differently: Place 0.4 mg under the tongue every 5 (five) minutes as needed for chest pain. Pt hasn't used in ~10 years, but keeps it on hand in case)   Omega-3 Fatty Acids (FISH OIL) 1200 MG  CAPS Take 3 capsules by mouth at bedtime.   omeprazole (PRILOSEC) 20 MG capsule Take 1 capsule by mouth daily.   polyethylene glycol (MIRALAX / GLYCOLAX) 17 g packet Take 17 g by mouth daily.   rosuvastatin (CRESTOR) 20 MG tablet Take 1 tablet (20 mg total) by mouth daily.   Radiology:   CT Chest 08/13/2019 1.  A 5.0 cm dilation of ascending thoracic aorta, likely associated with aortic stenosis due to dense calcifications about the aortic valve. Ascending thoracic aortic aneurysm. Recommend semi-annual imaging followup by CTA or MRA and referral to cardiothoracic surgery if not already obtained.   2. Coronary artery calcifications and signs of previous percutaneous coronary intervention. 3. 4 mm nodule in the left lung base. 4. 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  Cardiac Studies:   Coronary angiogram 04/20/2013: Non-ST elevation myocardial infarction 2. S/P Proximal and mid LAD 4.0 x 28 mm stent, proximal and mid circumflex 4.0 x 20 mm Promus Premier drug-eluting stent implantation. Mid LAD stent (09/30/2005 with 3.5 x 13 mmx2 overlapping stents) patent.  Coronary Angiogram 11/11/2019: LM: Normal LAD: Patent prox LAD stent with 20% late lumen loss at proximal edge LCx: Patent prox stent  [place]: Normal RCA: 95% stenosis in small RV marginal branch, unchanged compared to previous angiogram in 2014   Normal RHC. No pulmonary hypertension.  Carotid artery duplex 16/38/4665: Peak systolic velocities in the right bifurcation, internal, external and common carotid arteries are within normal limits. Duplex suggests stenosis in the left internal carotid artery (16-49%). Antegrade right vertebral artery flow. Antegrade left vertebral artery flow. Follow up in one year is appropriate if clinically indicated. Compared to 08/19/2019, no significant change.  S/P AVR (aortic valve replacement) and aortoplasty-Bentall procedure 05/12/2020:  25 mm Bovine valve and 30 mm Hemashield gold graft.  Echocardiogram 07/14/2020: Left ventricle cavity is normal in size and wall thickness. Normal global wall motion. Normal LV systolic function with EF 57%. Doppler evidence of grade I (impaired) diastolic dysfunction, normal LAP. Calculated EF 57%. S/p Bentall procedure with well seated  bioprosthetic aortic valve.  No aortic stenosis or regurgitation seen. Compared to previous study in 07/31/2019, bioprosthetic aortic valve is new.   EKG:   03/30/2021: Sinus rhythm with frequent PVCs at a rate of 80 bpm.  Right axis, RBBB.  Poor R wave progression, cannot exclude anteroseptal infarct old.  Voltage complexes.  06/30/2020: Sinus rhythm with first-degree AV block at rate of 99 bpm, left axis deviation, left intrafascicular block.  Right bundle branch block.  Trifascicular block.    03/24/2020: Normal sinus rhythm at rate of 70 bpm, rightward axis, RVH (RBBB).  Poor R wave progression, cannot exclude anteroseptal infarct old.  Low-voltage complexes.  Pulmonary disease pattern.  Nonspecific T abnormality. No significant change from EKG 08/07/2019   Assessment     ICD-10-CM   1. Coronary artery disease involving native coronary artery of native heart without angina pectoris  I25.10 EKG 12-Lead    2. 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 Asymptomatic stenosis of left carotid artery  I65.22     4. Essential hypertension  I10       No orders of the defined types were placed in this encounter.  There are no discontinued medications.  Orders Placed This Encounter  Procedures   EKG 12-Lead   PCV ECHOCARDIOGRAM COMPLETE  Standing Status:   Future    Standing Expiration Date:   03/30/2022    Recommendations:   Devon Sanchez  is a 70 y.o. Caucasian male  with CAD S/P Prox LAD stent in 2007 and again  on 04/20/2013 underwent stenting to the proximal and mid LAD and proximal and mid circumflex. 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  He underwent successful Bentall procedure  with bovine prosthetic aortic valve replacement and aortic root repair on 05/12/2020.  Patient presents for 36-month follow-up.  Last office visit patient was stable from a cardiovascular standpoint and no changes were made.  He does have carotid artery stenosis for which annual surveillance is appropriate, will need repeat carotid artery duplex in 06/2021.  Patient is doing well overall without specific complaints.  Blood pressure is well controlled.  I personally reviewed external labs, lipids are under excellent control.  Gust at length with patient the importance of dietary compliance and weight loss, he verbalized understanding but admits to being discouraged regarding his weight gain.  Encourage patient to continue to focus on calorie deficit.  Will obtain repeat echocardiogram to follow-up on 1 year postsurgery.  We will also obtain carotid artery duplex as he is due for this in January.  Follow-up in 6 months, sooner if needed.    Alethia Berthold, PA-C 03/30/2021, 3:13 PM Office: [phone]

## 2021-04-20 ENCOUNTER — Other Ambulatory Visit: Payer: Self-pay | Admitting: Cardiology

## 2021-05-03 DIAGNOSIS — I10 Essential (primary) hypertension: Secondary | ICD-10-CM | POA: Diagnosis not present

## 2021-05-03 DIAGNOSIS — E782 Mixed hyperlipidemia: Secondary | ICD-10-CM | POA: Diagnosis not present

## 2021-05-06 ENCOUNTER — Other Ambulatory Visit: Payer: Self-pay | Admitting: Cardiology

## 2021-05-15 ENCOUNTER — Other Ambulatory Visit: Payer: Self-pay

## 2021-05-15 MED ORDER — ROSUVASTATIN CALCIUM 20 MG PO TABS: 20.0000 mg | ORAL_TABLET | Freq: Every day | ORAL | 3 refills | Status: DC

## 2021-06-08 DIAGNOSIS — D509 Iron deficiency anemia, unspecified: Secondary | ICD-10-CM | POA: Diagnosis not present

## 2021-06-08 DIAGNOSIS — R7301 Impaired fasting glucose: Secondary | ICD-10-CM | POA: Diagnosis not present

## 2021-06-08 DIAGNOSIS — E782 Mixed hyperlipidemia: Secondary | ICD-10-CM | POA: Diagnosis not present

## 2021-06-13 DIAGNOSIS — I6522 Occlusion and stenosis of left carotid artery: Secondary | ICD-10-CM | POA: Diagnosis not present

## 2021-06-13 DIAGNOSIS — I129 Hypertensive chronic kidney disease with stage 1 through stage 4 chronic kidney disease, or unspecified chronic kidney disease: Secondary | ICD-10-CM | POA: Diagnosis not present

## 2021-06-13 DIAGNOSIS — I712 Thoracic aortic aneurysm, without rupture, unspecified: Secondary | ICD-10-CM | POA: Diagnosis not present

## 2021-06-13 DIAGNOSIS — E785 Hyperlipidemia, unspecified: Secondary | ICD-10-CM | POA: Diagnosis not present

## 2021-06-13 DIAGNOSIS — N182 Chronic kidney disease, stage 2 (mild): Secondary | ICD-10-CM | POA: Diagnosis not present

## 2021-06-13 DIAGNOSIS — G4733 Obstructive sleep apnea (adult) (pediatric): Secondary | ICD-10-CM | POA: Diagnosis not present

## 2021-06-13 DIAGNOSIS — R7303 Prediabetes: Secondary | ICD-10-CM | POA: Diagnosis not present

## 2021-06-13 DIAGNOSIS — Z952 Presence of prosthetic heart valve: Secondary | ICD-10-CM | POA: Diagnosis not present

## 2021-06-13 DIAGNOSIS — I2510 Atherosclerotic heart disease of native coronary artery without angina pectoris: Secondary | ICD-10-CM | POA: Diagnosis not present

## 2021-06-13 DIAGNOSIS — D509 Iron deficiency anemia, unspecified: Secondary | ICD-10-CM | POA: Diagnosis not present

## 2021-06-13 DIAGNOSIS — R5383 Other fatigue: Secondary | ICD-10-CM | POA: Diagnosis not present

## 2021-06-29 ENCOUNTER — Other Ambulatory Visit: Payer: Medicare Other

## 2021-06-29 ENCOUNTER — Ambulatory Visit: Payer: Medicare Other

## 2021-06-29 ENCOUNTER — Other Ambulatory Visit: Payer: Self-pay

## 2021-06-29 DIAGNOSIS — I6522 Occlusion and stenosis of left carotid artery: Secondary | ICD-10-CM | POA: Diagnosis not present

## 2021-06-29 DIAGNOSIS — Z952 Presence of prosthetic heart valve: Secondary | ICD-10-CM

## 2021-07-02 NOTE — Progress Notes (Signed)
Minimal stenosis in the carotid, no further follow-up studies indicated unless clinically needed.

## 2021-09-21 IMAGING — DX DG CHEST 1V PORT
1 series · 1 of 1 positions shown · non-contrast
Comparison: May 13, 2020.

CLINICAL DATA: Status post cardiac surgery.

EXAM:
PORTABLE CHEST 1 VIEW

[chest ap]
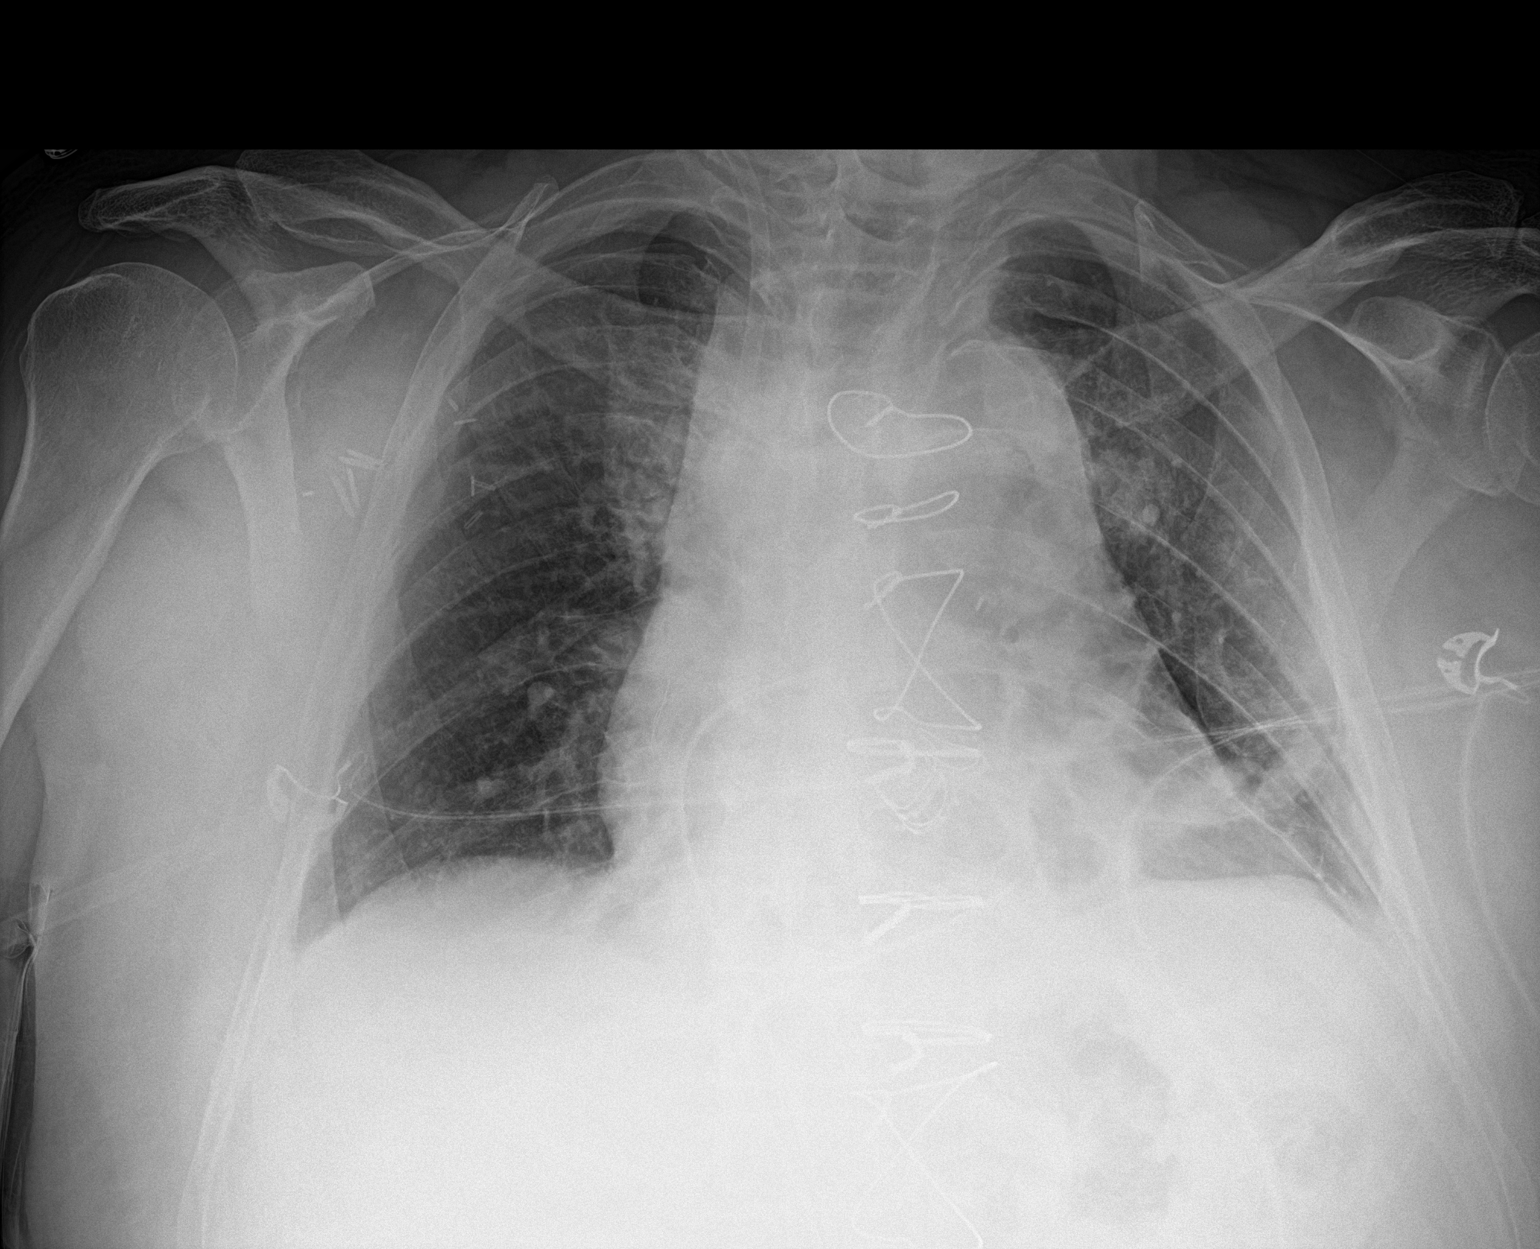

[1 of 1 positions shown; findings below may reference images not displayed]

FINDINGS: Stable cardiomegaly. Status post aortic valve repair. Swan-Ganz
catheter has been removed. Small right apical pneumothorax is noted.
Mild left basilar atelectasis is noted. Bony thorax is unremarkable.
IMPRESSION: Small right apical pneumothorax is noted. Mild left basilar
subsegmental atelectasis. Swan-Ganz catheter has been removed. These
results will be called to the ordering clinician or representative
by the Radiologist Assistant, and communication documented in the
PACS or zVision Dashboard.

## 2021-09-23 IMAGING — DX DG CHEST 2V
2 series · 2 of 2 positions shown · non-contrast
Comparison: One-view chest x-ray 05/15/2020

CLINICAL DATA: Shortness of breath.  Recent open heart surgery.

EXAM:
CHEST - 2 VIEW

[chest lat]
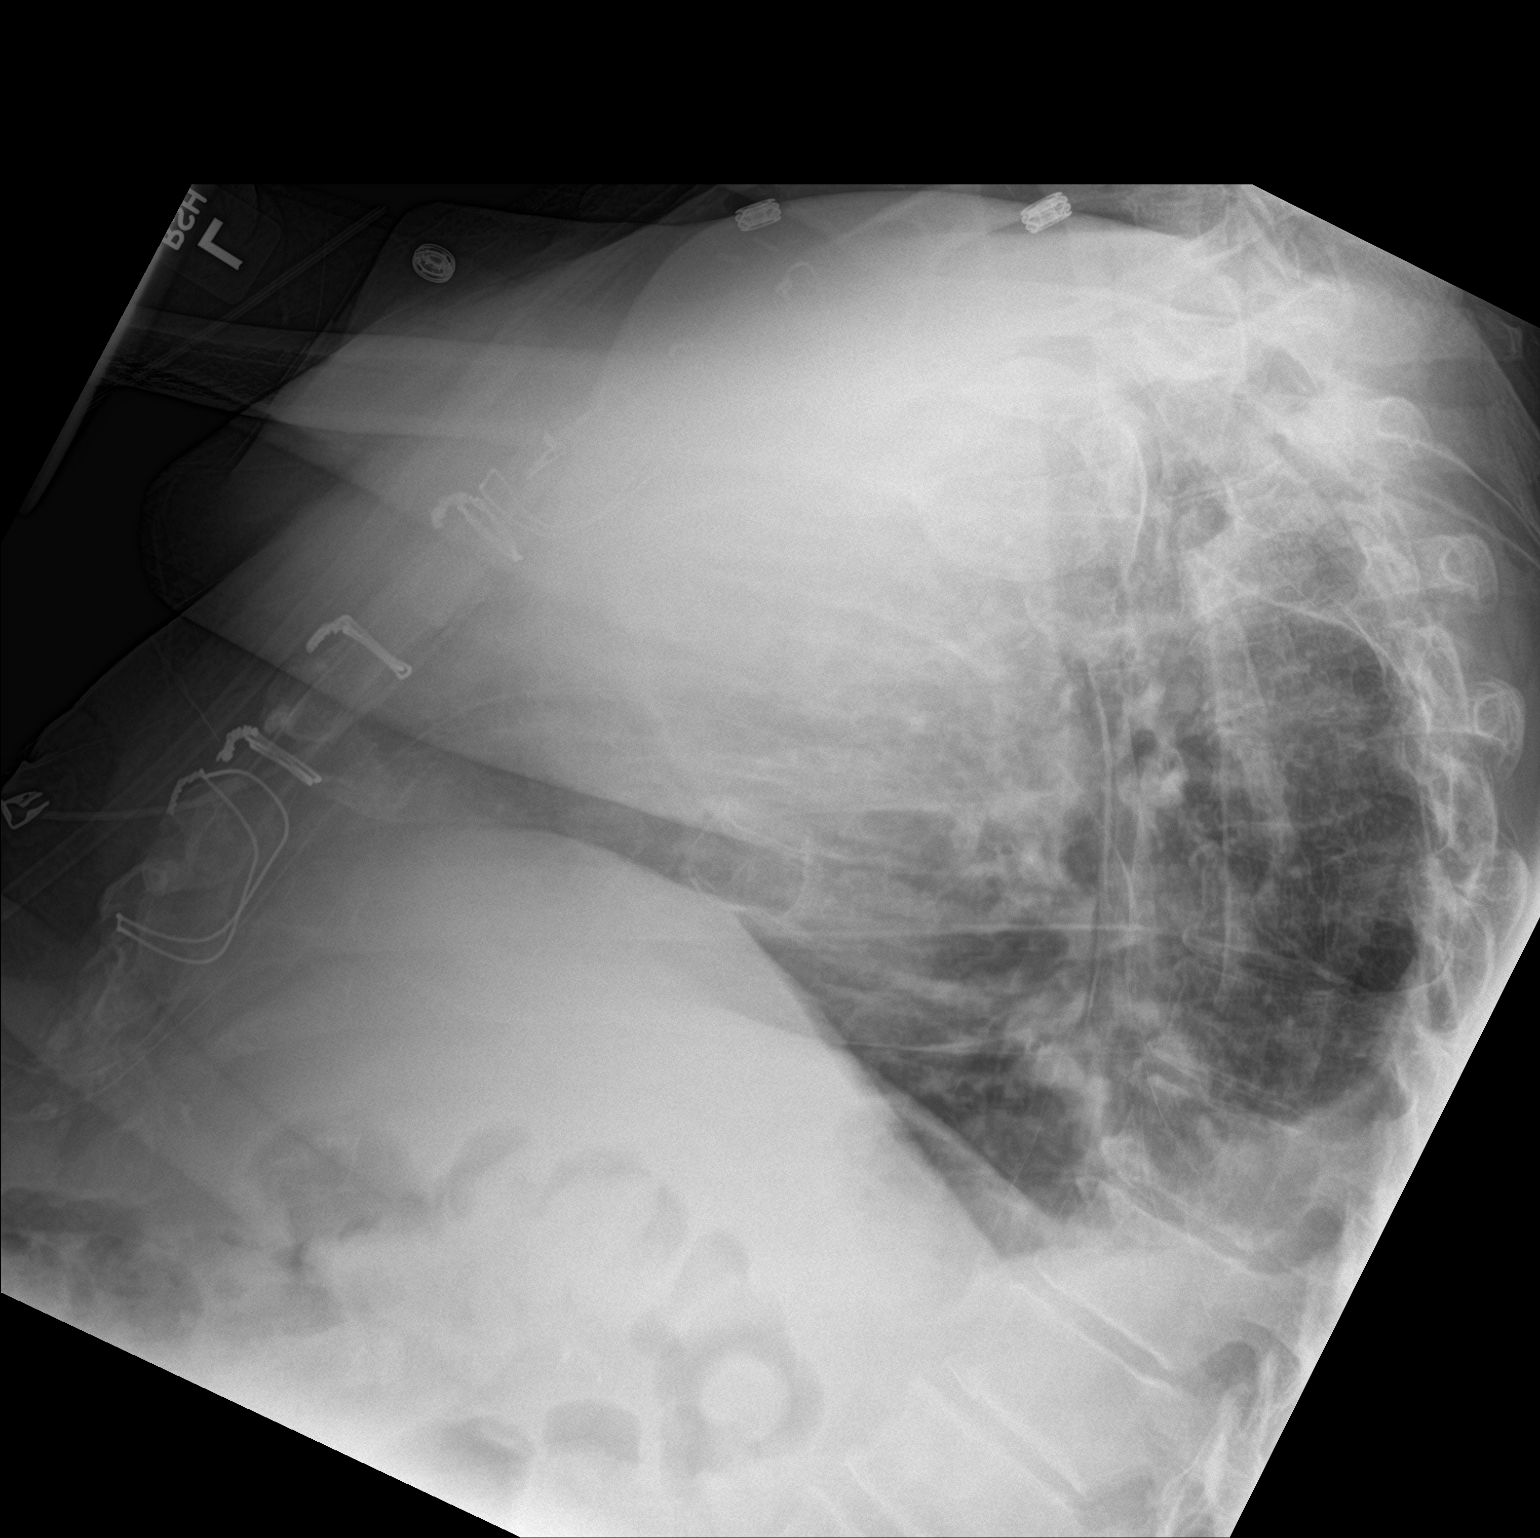

[chest ap]
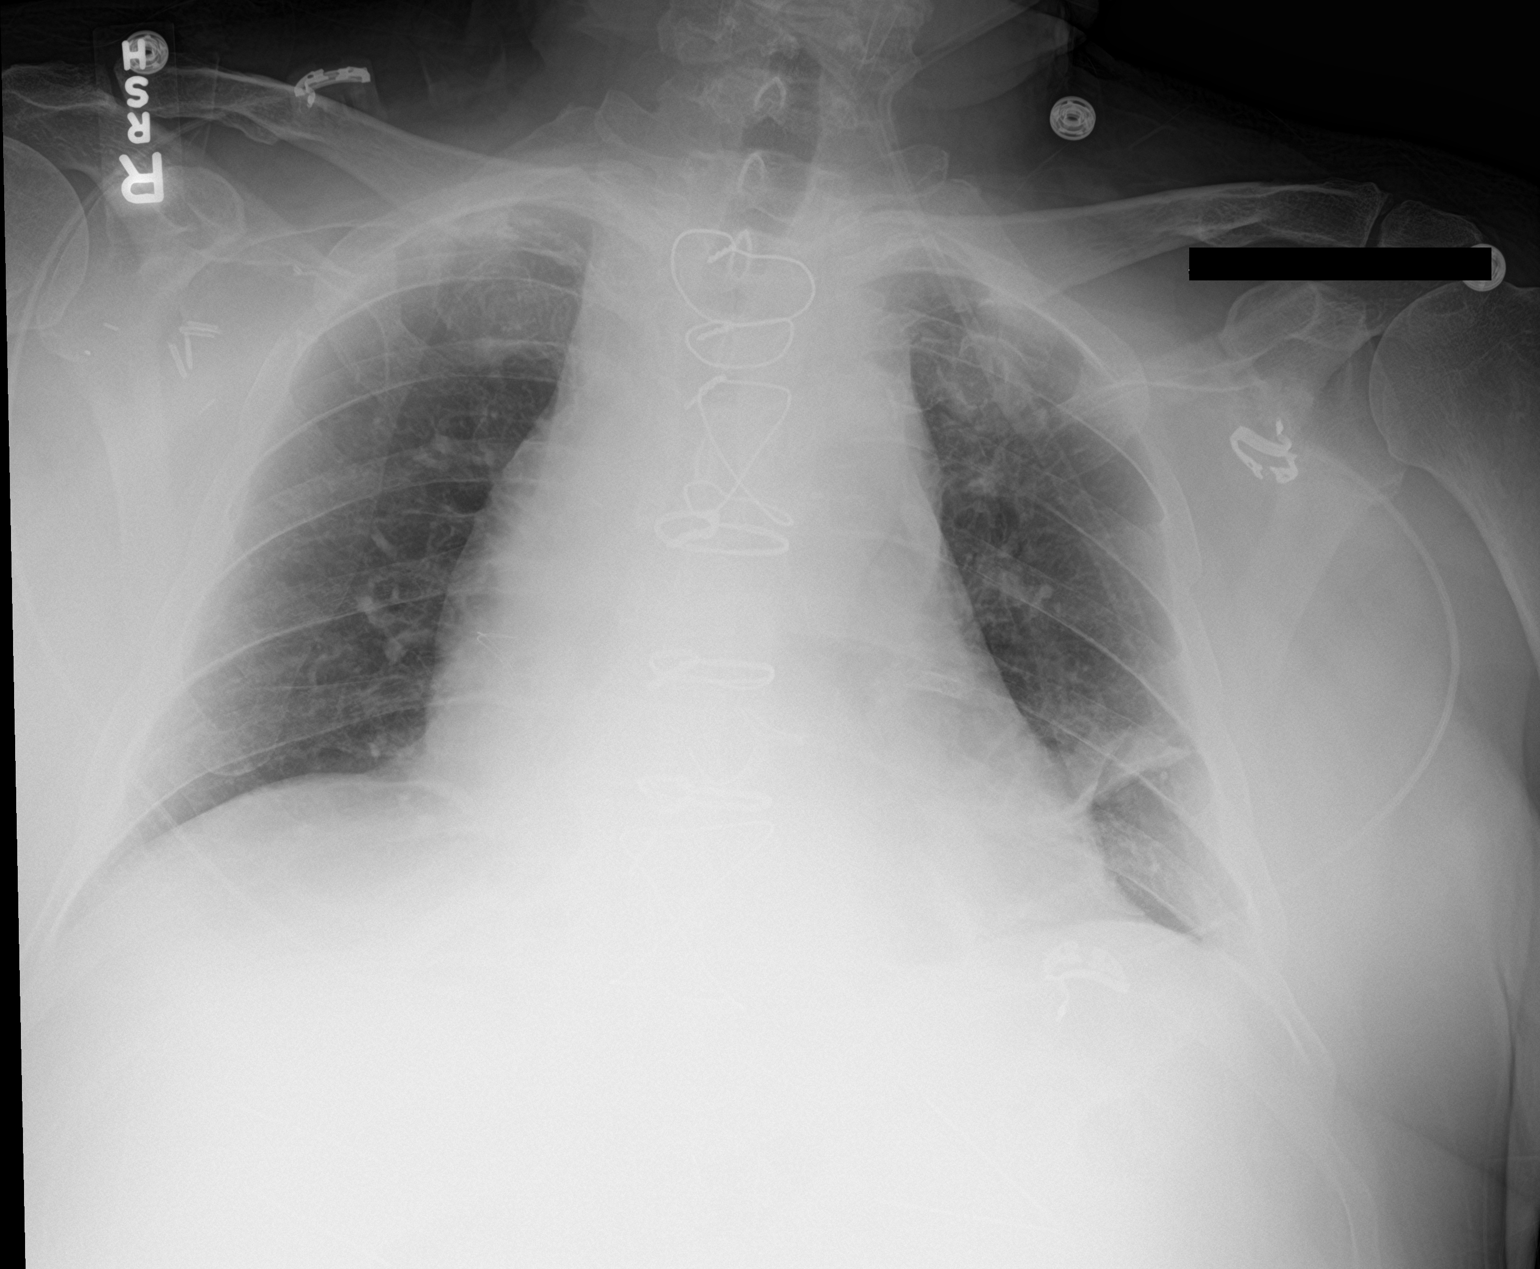

[2 of 2 positions shown; findings below may reference images not displayed]

FINDINGS: Heart is enlarged. Atherosclerotic calcifications present at the
aortic arch. Subsegmental atelectasis is again noted on the left.
Lung volumes are low. Small pneumothorax is decreasing at the right
apex.
IMPRESSION: 1. Decreasing small right pneumothorax.
2. Stable left basilar atelectasis.
3. Stable cardiomegaly without failure.

## 2021-09-28 NOTE — Progress Notes (Signed)
? ?Primary Physician/Referring:  Celene Squibb, MD ? ?Patient ID: Devon Sanchez, male    DOB: 28-Oct-1950, 71 y.o.   MRN: [id number] ? ?Chief Complaint  ?Patient presents with  ? Follow-up  ?   6 MONTH  ? Coronary Artery Disease  ? LVE REPLACEMENT  ? Hypertension  ? ?HPI:   ? ?Devon Sanchez  is a 71 y.o. Caucasian male  with CAD S/P Prox LAD stent in 2007 and again  on 04/20/2013 underwent stenting to the proximal and mid LAD and proximal and mid circumflex. 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 ? ?Patient presents for 6 month follow up. At last office visit patient was stable form a cardiovascular standpoint therefore no changes were made. Repeat carotid duplex revealed only minimal stenosis, therefore no annual surveillance is warranted at this time. Repeat echocardiogram was fairly stable, details below.  ? ?Patient denies chest pain, dyspnea, palpitations. His primary concern today is fatigue and lack of motivation over the last 1 year. 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 He has no formal exercise routine.  ? ?Notably patient is seeing his PCP in June. At last PCP visit increased Lexapro. Patient states he remains depressed.  ? ?Past Medical History:  ?Diagnosis Date  ? Anxiety   ? Aortic root dilatation (HCC)   ? Atherosclerosis   ? Bicuspid aortic valve 07/25/2018  ? Coronary artery disease   ? Depression   ? Dizziness and giddiness   ? Erectile dysfunction   ? Hypercholesterolemia   ? Hyperglycemia   ? Hyperlipidemia   ? Hypertension   ? MI (myocardial infarction) ([place])   ? Midsystolic murmur   ? bicuspid AV with moderate AS (08/2019)  ? Reflux   ? Sleep apnea   ? Thoracic  ascending aortic aneurysm ([place])   ? ?Past Surgical History:  ?Procedure Laterality Date  ? BENTALL PROCEDURE N/A 05/12/2020  ? Procedure: BENTALL PROCEDURE;  Surgeon: Wonda Olds, MD;  Location: [place];  Service: Open Heart Surgery;  Laterality: N/A;  ? COLONOSCOPY N/A 11/04/2014  ? Procedure: COLONOSCOPY;  Surgeon: Rogene Houston, MD;  [facility];  Service: Endoscopy;  Laterality: N/A;  830 - moved to 6/2 @ 10:30  ? CORONARY ANGIOPLASTY WITH STENT PLACEMENT    ? LEFT HEART CATHETERIZATION WITH CORONARY ANGIOGRAM N/A 04/20/2013  ? Procedure: LEFT HEART CATHETERIZATION WITH CORONARY ANGIOGRAM;  Surgeon: Laverda Page, MD;  [facility] C[facility] LAB;  Service: Cardiovascular;  Laterality: N/A;  ? RIGHT/LEFT HEART CATH AND CORONARY ANGIOGRAPHY N/A 11/10/2019  ? Procedure: RIGHT/LEFT HEART CATH AND CORONARY ANGIOGRAPHY;  Surgeon: Nigel Mormon, MD;  Location: [place] CV LAB;  Service: Cardiovascular;  Laterality: N/A;  ? TEE WITHOUT CARDIOVERSION N/A 05/12/2020  ? Procedure: TRANSESOPHAGEAL ECHOCARDIOGRAM (TEE);  Surgeon: Wonda Olds, MD;  Location: [place];  Service: Open Heart Surgery;  Laterality: N/A;  ? ?Family History  ?Problem Relation Age of Onset  ? Failure to thrive Mother   ? Diabetes Mother   ? Heart attack Father   ? Prostate cancer Father   ? Heart failure Father   ? Heart disease Father   ?  ?Social History  ? ?  Tobacco Use  ? Smoking status: Never  ? Smokeless tobacco: Never  ?Substance Use Topics  ? Alcohol use: No  ? ?ROS  ?Review of Systems  ?Constitutional: Positive for malaise/fatigue.  ?Cardiovascular:  Negative for chest pain, claudication, dyspnea on exertion, leg swelling, near-syncope, orthopnea, palpitations, paroxysmal nocturnal dyspnea and syncope.  ?Respiratory:  Negative for shortness of breath.   ?Gastrointestinal:  Negative for melena.  ?Neurological:  Negative for dizziness.  ?Objective  ?Blood pressure 128/83, pulse 74, temperature 98 ?F (36.7 ?C),  temperature source Temporal, resp. rate 17, height '6\' 2"'$  (1.88 m), weight 253 lb (114.8 kg), SpO2 96 %.  ? ?  09/29/2021  ? 11:37 AM 03/30/2021  ?  2:31 PM 09/23/2020  ?  2:10 PM  ?Vitals with BMI  ?Height '6\' 2"'$  '6\' 2"'$  '6\' 2"'$   ?Weight 253 lbs 255 lbs 244 lbs 4 oz  ?BMI 32.47 32.73 31.35  ?Systolic 102 725   ?Diastolic 83 73   ?Pulse 74 71   ?  ? Physical Exam ?Vitals reviewed.  ?Constitutional:   ?   General: He is not in acute distress. ?   Appearance: He is obese.  ?   Comments: Well-built and mildly to moderately obese  ?Cardiovascular:  ?   Rate and Rhythm: Normal rate and regular rhythm.  ?   Pulses: Normal pulses and intact distal pulses.     ?     Carotid pulses are 2+ on the right side with bruit and 2+ on the left side with bruit. ?   Heart sounds: S1 normal and S2 normal. No murmur heard. ?  No gallop.  ?   Comments: No JVD.  ?Pulmonary:  ?   Effort: Pulmonary effort is normal. No respiratory distress.  ?   Breath sounds: Normal breath sounds. No wheezing, rhonchi or rales.  ?Chest:  ?   Comments: Sternotomy scar noted ?Abdominal:  ?   General: Bowel sounds are normal.  ?   Palpations: Abdomen is soft.  ?   Tenderness: There is no rebound.  ?Musculoskeletal:  ?   Right lower leg: No edema.  ?   Left lower leg: No edema.  ?Neurological:  ?   Mental Status: He is alert.  ?Physical exam unchanged compared to previous office visit.  ? ?Laboratory examination:  ? ? ?  Latest Ref Rng & Units 05/19/2020  ?  1:12 AM 05/18/2020  ? 12:52 AM 05/17/2020  ?  1:47 AM  ?CMP  ?Glucose 70 - 99 mg/dL 101   106   116    ?BUN 8 - 23 mg/dL '23   28   25    '$ ?Creatinine 0.61 - 1.24 mg/dL 1.22   1.28   1.16    ?Sodium 135 - 145 mmol/L 135   134   133    ?Potassium 3.5 - 5.1 mmol/L 4.2   4.1   4.0    ?Chloride 98 - 111 mmol/L 98   95   95    ?CO2 22 - 32 mmol/L '29   27   27    '$ ?Calcium 8.9 - 10.3 mg/dL 8.5   8.6   8.6    ?Total Protein 6.5 - 8.1 g/dL   6.4    ?Total Bilirubin 0.3 - 1.2 mg/dL   1.4    ?Alkaline Phos 38 - 126 U/L   89     ?AST 15 - 41 U/L   36    ?ALT 0 - 44 U/L  38    ? ? ?  Latest Ref Rng & Units 05/18/2020  ? 12:52 AM 05/17/2020  ?  1:47 AM 05/16/2020  ?  8:10 AM  ?CBC  ?WBC 4.0 - 10.5 K/uL 6.8   6.8   7.2    ?Hemoglobin 13.0 - 17.0 g/dL 8.9   9.4   8.0    ?Hematocrit 39.0 - 52.0 % 26.2   26.4   23.1    ?Platelets 150 - 400 K/uL 175   164   150    ? ?Lipid Panel  ?No results found for: CHOL, TRIG, HDL, CHOLHDL, VLDL, LDLCALC, LDLDIRECT ?HEMOGLOBIN A1C ?Lab Results  ?Component Value Date  ? HGBA1C 5.8 (H) 05/09/2020  ? MPG 119.76 05/09/2020  ? ?TSH ?No results for input(s): TSH in the last 8760 hours. ? ?External labs:  ?09/06/2020: ?HDL 36, LDL 70, total cholesterol 126, triglycerides 106 ?A1c 6.3% ?BUN 14, creatinine 1.07, GFR 60 ? ?Cholesterol, total 144.000 M 03/26/2019 ?HDL 37.000 MG 03/26/2019 ?LDL-C 70.000 MG 09/22/2018 ?Triglycerides 164.000 M 03/26/2019 ? ?A1C 5.800 % 03/26/2019; TSH 3.100 07/17/2016 ? ?Creatinine, Serum 1.490 MG/ 03/26/2019 ?Potassium 4.200 MM 01/10/2016 ?ALT (SGPT) 24.000 IU/ 03/26/2019 ? ?Hemoglobin 11.900 G/ 03/26/2019; Platelets 213.000 X 03/26/2019 ? ?Allergies  ? ?Allergies  ?Allergen Reactions  ? Bupropion Other (See Comments)  ?  MADE CHEST HURT  ? Naproxen Sodium Other (See Comments)  ?  GI BLEEDING  ?  ?Medications Prior to Visit:  ? ?Outpatient Medications Prior to Visit  ?Medication Sig Dispense Refill  ? acetaminophen (TYLENOL) 500 MG tablet Take 500 mg by mouth every 6 (six) hours as needed for moderate pain or headache.     ? amoxicillin (AMOXIL) 500 MG capsule Take 4 capsules (2,000 mg total) by mouth once as needed for up to 1 dose (Dental work). 4 capsule 3  ? aspirin EC 325 MG EC tablet Take 1 tablet (325 mg total) by mouth daily.    ? carvedilol (COREG) 6.25 MG tablet TAKE 1 TABLET BY MOUTH  TWICE DAILY WITH A MEAL 180 tablet 3  ? escitalopram (LEXAPRO) 10 MG tablet Take 1 tablet (10 mg total) by mouth daily. (Patient taking differently: Take 20 mg by mouth daily.) 30 tablet 2  ? losartan  (COZAAR) 25 MG tablet Take 1 tablet by mouth daily.    ? Multiple Vitamins-Minerals (CENTRUM SILVER PO) Take 1 tablet by mouth every evening.     ? nitroGLYCERIN (NITROSTAT) 0.4 MG SL tablet Place 1 tablet (0.4 mg tota

## 2021-09-29 ENCOUNTER — Encounter: Payer: Self-pay | Admitting: Student

## 2021-09-29 ENCOUNTER — Ambulatory Visit: Payer: Medicare Other | Admitting: Student

## 2021-09-29 VITALS — BP 128/83 | HR 74 | Temp 98.0°F | Resp 17 | Ht 74.0 in | Wt 253.0 lb

## 2021-09-29 DIAGNOSIS — I10 Essential (primary) hypertension: Secondary | ICD-10-CM | POA: Diagnosis not present

## 2021-09-29 DIAGNOSIS — I2510 Atherosclerotic heart disease of native coronary artery without angina pectoris: Secondary | ICD-10-CM

## 2021-09-29 DIAGNOSIS — Z952 Presence of prosthetic heart valve: Secondary | ICD-10-CM

## 2021-10-07 IMAGING — DX DG CHEST 2V
2 series · 2 of 2 positions shown · non-contrast
Comparison: 05/16/2020, 05/15/2020, 05/14/2020, 05/09/2020

CLINICAL DATA: Status post aortic aneurysm repair

EXAM:
CHEST - 2 VIEW

[dg chest 2 view (1 of 2)]
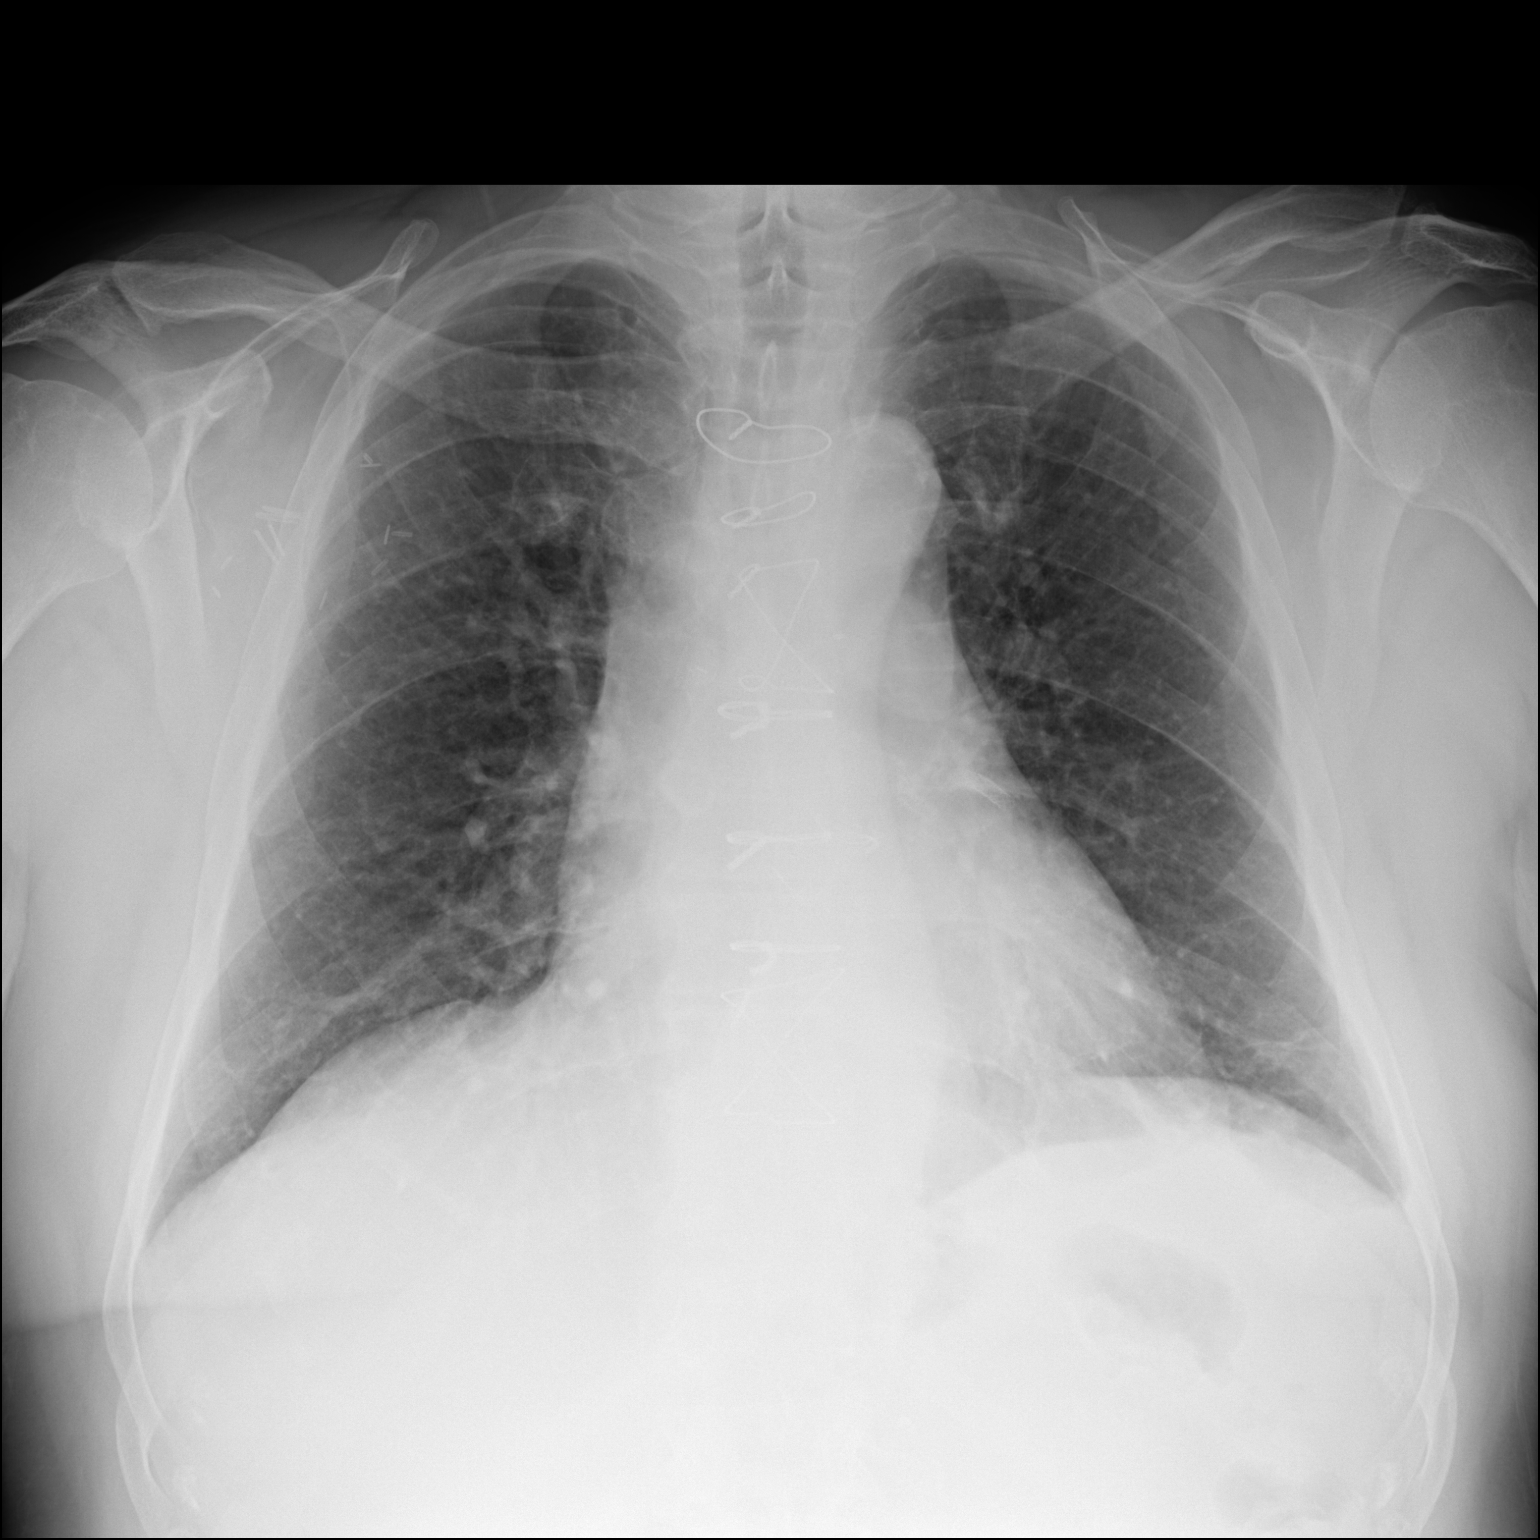

[dg chest 2 view (2 of 2)]
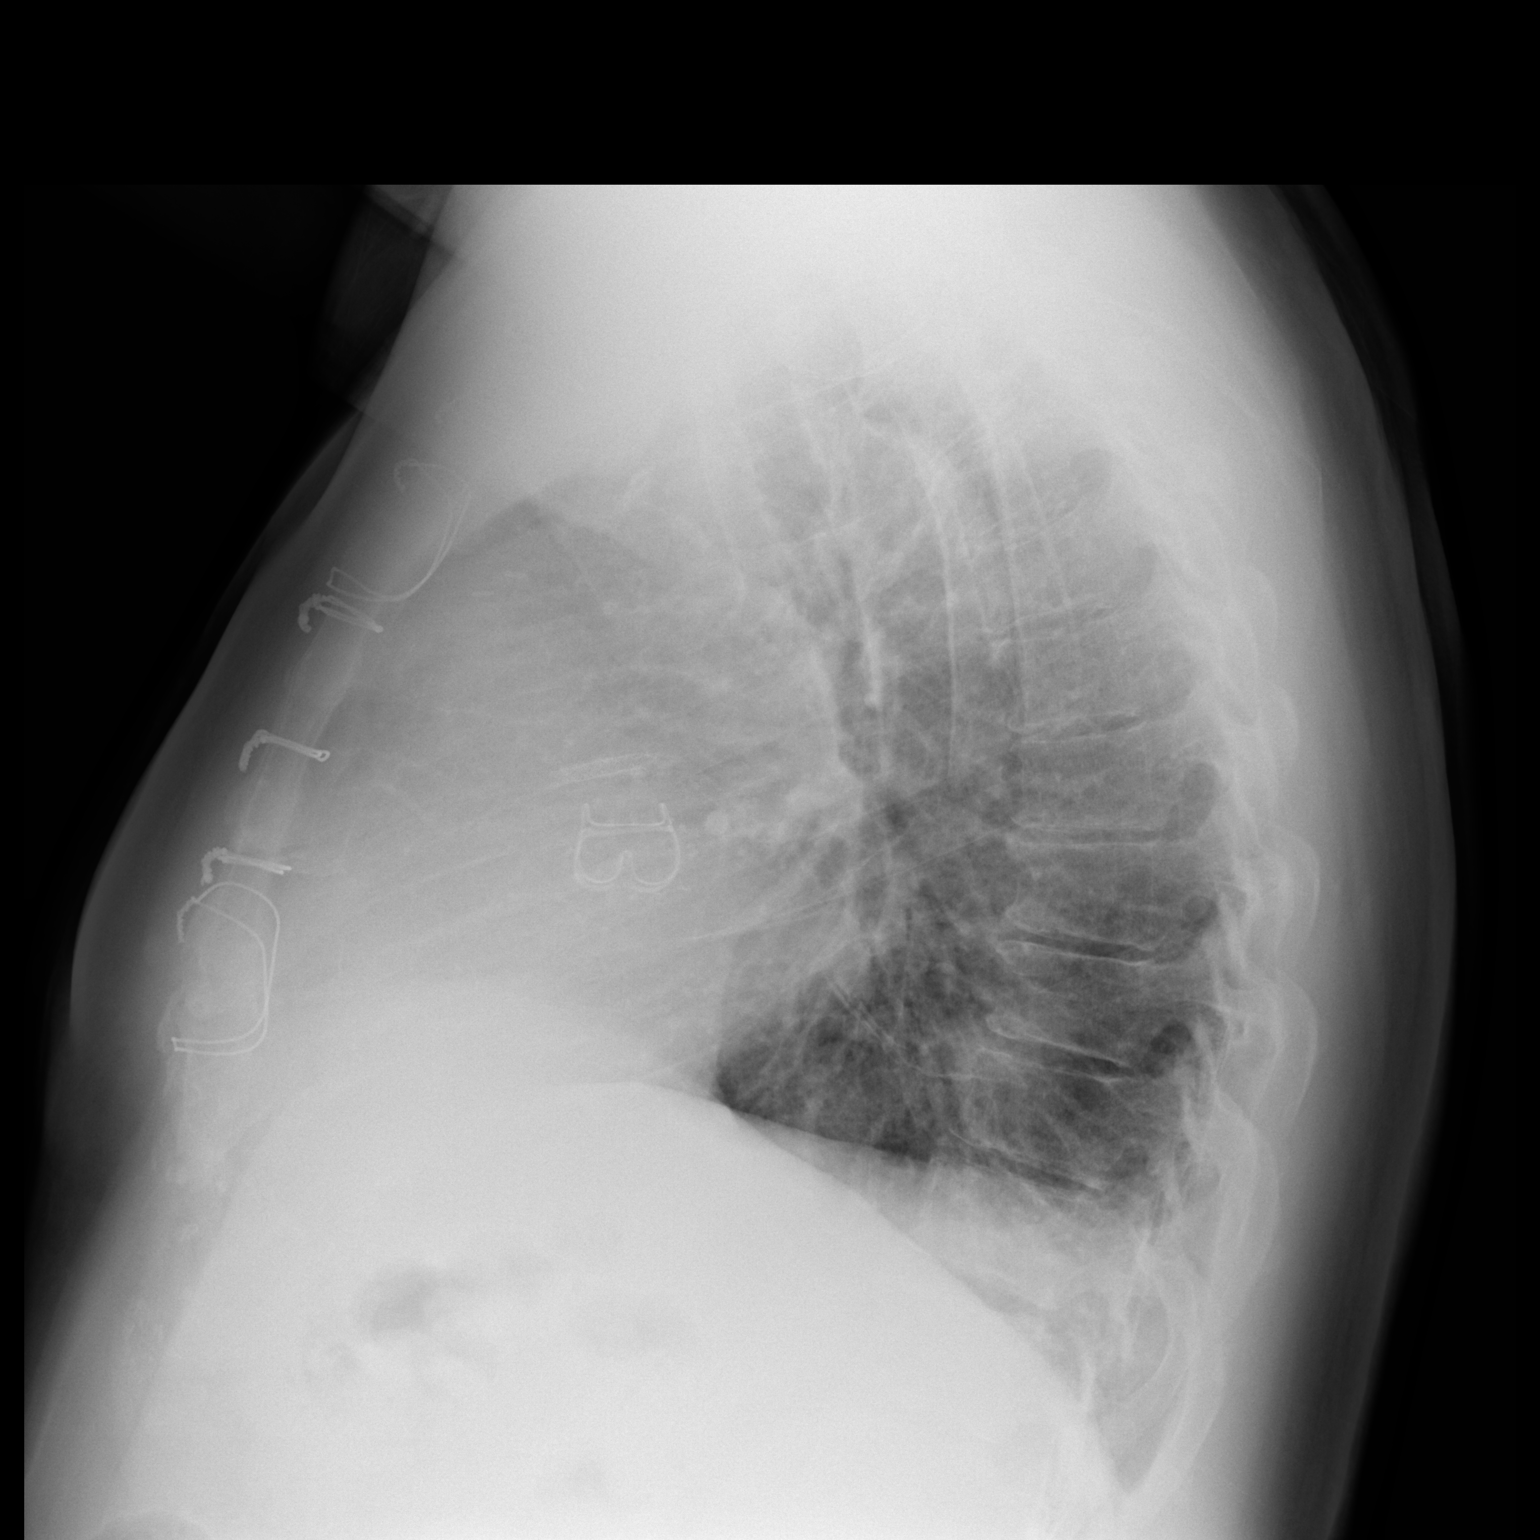

[2 of 2 positions shown; findings below may reference images not displayed]

FINDINGS: Post sternotomy changes and valve prosthesis. Surgical clips in the
right axilla. Trace left pleural effusion and mild atelectasis at
the left base. Resolution of previously noted trace right apical
pneumothorax. Cardiomediastinal silhouette within normal limits and
decreased compared to prior.
IMPRESSION: 1. Resolution of previously noted trace right apical pneumothorax.
2. Trace left pleural effusion with mild subsegmental atelectasis at
the left base, improved aeration since 05/16/2020.
[DATE]. Cardiomediastinal silhouette within normal limits and is
decreased compared to prior radiograph.

## 2021-12-08 DIAGNOSIS — E785 Hyperlipidemia, unspecified: Secondary | ICD-10-CM | POA: Diagnosis not present

## 2021-12-08 DIAGNOSIS — R7303 Prediabetes: Secondary | ICD-10-CM | POA: Diagnosis not present

## 2021-12-14 DIAGNOSIS — G4733 Obstructive sleep apnea (adult) (pediatric): Secondary | ICD-10-CM | POA: Diagnosis not present

## 2021-12-14 DIAGNOSIS — I712 Thoracic aortic aneurysm, without rupture, unspecified: Secondary | ICD-10-CM | POA: Diagnosis not present

## 2021-12-14 DIAGNOSIS — I129 Hypertensive chronic kidney disease with stage 1 through stage 4 chronic kidney disease, or unspecified chronic kidney disease: Secondary | ICD-10-CM | POA: Diagnosis not present

## 2021-12-14 DIAGNOSIS — R7303 Prediabetes: Secondary | ICD-10-CM | POA: Diagnosis not present

## 2021-12-14 DIAGNOSIS — I6522 Occlusion and stenosis of left carotid artery: Secondary | ICD-10-CM | POA: Diagnosis not present

## 2021-12-14 DIAGNOSIS — E782 Mixed hyperlipidemia: Secondary | ICD-10-CM | POA: Diagnosis not present

## 2021-12-14 DIAGNOSIS — R5383 Other fatigue: Secondary | ICD-10-CM | POA: Diagnosis not present

## 2021-12-14 DIAGNOSIS — N182 Chronic kidney disease, stage 2 (mild): Secondary | ICD-10-CM | POA: Diagnosis not present

## 2021-12-14 DIAGNOSIS — Z952 Presence of prosthetic heart valve: Secondary | ICD-10-CM | POA: Diagnosis not present

## 2021-12-14 DIAGNOSIS — D509 Iron deficiency anemia, unspecified: Secondary | ICD-10-CM | POA: Diagnosis not present

## 2021-12-14 DIAGNOSIS — I2510 Atherosclerotic heart disease of native coronary artery without angina pectoris: Secondary | ICD-10-CM | POA: Diagnosis not present

## 2021-12-18 DIAGNOSIS — K449 Diaphragmatic hernia without obstruction or gangrene: Secondary | ICD-10-CM | POA: Diagnosis not present

## 2021-12-18 DIAGNOSIS — D509 Iron deficiency anemia, unspecified: Secondary | ICD-10-CM | POA: Diagnosis not present

## 2021-12-18 NOTE — Progress Notes (Signed)
External labs 12/08/2021: A1c 6.0% Total cholesterol 131, triglycerides 110, HDL 31, LDL 80 BUN 21, creatinine 1.23, GFR >60, sodium 135, potassium 4.6 Hgb 14.4, HCT 43.6, MCV 93, platelet 174

## 2021-12-22 ENCOUNTER — Other Ambulatory Visit: Payer: Self-pay

## 2021-12-29 DIAGNOSIS — D509 Iron deficiency anemia, unspecified: Secondary | ICD-10-CM | POA: Diagnosis not present

## 2022-01-08 ENCOUNTER — Ambulatory Visit (INDEPENDENT_AMBULATORY_CARE_PROVIDER_SITE_OTHER): Payer: Medicare Other | Admitting: Urology

## 2022-01-08 VITALS — BP 117/76 | HR 67 | Ht 74.0 in | Wt 226.0 lb

## 2022-01-08 DIAGNOSIS — R972 Elevated prostate specific antigen [PSA]: Secondary | ICD-10-CM | POA: Diagnosis not present

## 2022-01-08 DIAGNOSIS — N402 Nodular prostate without lower urinary tract symptoms: Secondary | ICD-10-CM

## 2022-01-08 LAB — URINALYSIS, ROUTINE W REFLEX MICROSCOPIC
Bilirubin, UA: NEGATIVE
Glucose, UA: NEGATIVE
Ketones, UA: NEGATIVE
Leukocytes,UA: NEGATIVE
Nitrite, UA: NEGATIVE
Protein,UA: NEGATIVE
RBC, UA: NEGATIVE
Specific Gravity, UA: <1.005 — ABNORMAL LOW (ref 1.005–1.030)
Urobilinogen, Ur: 1 mg/dL (ref 0.2–1.0)
pH, UA: 5.5 (ref 5.0–7.5)

## 2022-01-08 NOTE — Progress Notes (Signed)
01/08/2022 2:13 PM   Devon Sanchez November 20, 1950 [id number]  Referring provider: Celene Squibb, MD [address] [place],  [place] [address]  No chief complaint on file.   HPI:  New patient-  1) PSA elevation -  patient with a July 2023 PSA of 7.1. No h/o PSA elevation or prostate biopsy.  Prior elevation No FH bx. Dad had BPH. No h/o BPH.   No blood thinners. CABG and aneurysm repair in 2021 at [facility].   He is on a diet and lost 33 lbs.   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   PMH: Past Medical History:  Diagnosis Date   Anxiety    Aortic root dilatation (HCC)    Atherosclerosis    Bicuspid aortic valve 07/25/2018   Coronary artery disease    Depression    Dizziness and giddiness    Erectile dysfunction    Hypercholesterolemia    Hyperglycemia    Hyperlipidemia    Hypertension    MI (myocardial infarction) ([place])    Midsystolic murmur    bicuspid AV with moderate AS (08/2019)   Reflux    Sleep apnea    Thoracic ascending aortic aneurysm [facility])     Surgical History: Past Surgical History:  Procedure Laterality Date   BENTALL PROCEDURE N/A 05/12/2020   Procedure: BENTALL PROCEDURE;  Surgeon: Wonda Olds, MD;  Location: [place];  Service: Open Heart Surgery;  Laterality: N/A;   COLONOSCOPY N/A 11/04/2014   Procedure: COLONOSCOPY;  Surgeon: Rogene Houston, MD;  [facility];  Service: Endoscopy;  Laterality: N/A;  830 - moved to 6/2 @ 10:30   CORONARY ANGIOPLASTY WITH STENT PLACEMENT     LEFT HEART CATHETERIZATION WITH CORONARY ANGIOGRAM N/A 04/20/2013   Procedure: LEFT HEART CATHETERIZATION WITH CORONARY ANGIOGRAM;  Surgeon: Laverda Page, MD;  Location: [facility] CATH LAB;  Service: Cardiovascular;  Laterality: N/A;   RIGHT/LEFT HEART CATH AND CORONARY ANGIOGRAPHY N/A 11/10/2019   Procedure: RIGHT/LEFT HEART CATH AND CORONARY ANGIOGRAPHY;  Surgeon: Nigel Mormon, MD;  Location: [place] CV LAB;  Service: Cardiovascular;  Laterality: N/A;    TEE WITHOUT CARDIOVERSION N/A 05/12/2020   Procedure: TRANSESOPHAGEAL ECHOCARDIOGRAM (TEE);  Surgeon: Wonda Olds, MD;  Location: [place];  Service: Open Heart Surgery;  Laterality: N/A;    Home Medications:  Allergies as of 01/08/2022       Reactions   Bupropion Other (See Comments)   MADE CHEST HURT   Naproxen Sodium Other (See Comments)   GI BLEEDING        Medication List        Accurate as of January 08, 2022  2:13 PM. If you have any questions, ask your nurse or doctor.          acetaminophen 500 MG tablet Commonly known as: TYLENOL Take 500 mg by mouth every 6 (six) hours as needed for moderate pain or headache.   amoxicillin 500 MG capsule Commonly known as: AMOXIL Take 4 capsules (2,000 mg total) by mouth once as needed for up to 1 dose (Dental work).   aspirin EC 325 MG tablet Take 1 tablet (325 mg total) by mouth daily.   carvedilol 6.25 MG tablet Commonly known as: COREG TAKE 1 TABLET BY MOUTH  TWICE DAILY WITH A MEAL   CENTRUM SILVER PO Take 1 tablet by mouth every evening.   escitalopram 10 MG tablet Commonly known as: Lexapro Take 1 tablet (10 mg total) by mouth  daily. What changed: how much to take   Fish Oil 1200 MG Caps Take 3 capsules by mouth at bedtime.   losartan 25 MG tablet Commonly known as: COZAAR Take 1 tablet by mouth daily.   nitroGLYCERIN 0.4 MG SL tablet Commonly known as: NITROSTAT Place 1 tablet (0.4 mg total) under the tongue every 5 (five) minutes as needed for up to 25 days for chest pain. What changed: additional instructions   omeprazole 20 MG capsule Commonly known as: PRILOSEC Take 1 capsule by mouth daily.   polyethylene glycol 17 g packet Commonly known as: MIRALAX / GLYCOLAX Take 17 g by mouth daily.   rosuvastatin 20 MG tablet Commonly known as: CRESTOR Take 1 tablet (20 mg total) by mouth daily.        Allergies:  Allergies  Allergen Reactions   Bupropion Other (See Comments)    MADE CHEST  HURT   Naproxen Sodium Other (See Comments)    GI BLEEDING    Family History: Family History  Problem Relation Age of Onset   Failure to thrive Mother    Diabetes Mother    Heart attack Father    Prostate cancer Father    Heart failure Father    Heart disease Father     Social History:  reports that he has never smoked. He has never used smokeless tobacco. He reports that he does not drink alcohol and does not use drugs.   Physical Exam: There were no vitals taken for this visit.  Constitutional:  Alert and oriented, No acute distress. HEENT: [place] AT, moist mucus membranes.  Trachea midline, no masses. Cardiovascular: No clubbing, cyanosis, or edema. Respiratory: Normal respiratory effort, no increased work of breathing. GI: Abdomen is soft, nontender, nondistended, no abdominal masses GU: No CVA tenderness Lymph: No cervical or inguinal lymphadenopathy. Skin: No rashes, bruises or suspicious lesions. Neurologic: Grossly intact, no focal deficits, moving all 4 extremities. Psychiatric: Normal mood and affect. DRE: prostate about 30 grams with a 5 mm right base nodule.   Laboratory Data: Lab Results  Component Value Date   WBC 6.8 05/18/2020   HGB 8.9 (L) 05/18/2020   HCT 26.2 (L) 05/18/2020   MCV 94.6 05/18/2020   PLT 175 05/18/2020    Lab Results  Component Value Date   CREATININE 1.22 05/19/2020    No results found for: "PSA"  No results found for: "TESTOSTERONE"  Lab Results  Component Value Date   HGBA1C 5.8 (H) 05/09/2020    Urinalysis    Component Value Date/Time   COLORURINE YELLOW 05/09/2020 1113   APPEARANCEUR CLEAR 05/09/2020 1113   LABSPEC 1.021 05/09/2020 1113   PHURINE 5.0 05/09/2020 1113   GLUCOSEU NEGATIVE 05/09/2020 1113   [place] 05/09/2020 1113   [place] 05/09/2020 1113   [place] 05/09/2020 1113   PROTEINUR NEGATIVE 05/09/2020 1113   NITRITE NEGATIVE 05/09/2020 1113   LEUKOCYTESUR NEGATIVE 05/09/2020  1113    No results found for: "LABMICR", "WBCUA", "RBCUA", "LABEPIT", "MUCUS", "BACTERIA"  Pertinent Imaging:    Assessment & Plan:    1. Elevated PSA I had a long discussion with the patient on the nature of elevated PSA - benign vs malignant causes. We discussed age specific levels and that PCa can be seen on a biopsy with very low PSA levels (<=2.5). We discussed the nature risks and benefits of continued surveillance, other lab tests, imaging as well as prostate biopsy. We discussed the management of prostate cancer might include active surveillance or  treatment depending on biopsy findings. All questions answered.   I repeated the PSA. If normalized we will monitor closely with a repeat PSA and DRE in 3 mo. If remains elevated will set up for a prostate MRI.    No follow-ups on file.  Festus Aloe, MD  [facility]  [address] [place], [place] [address] [phone]

## 2022-01-09 LAB — PSA, TOTAL AND FREE
PSA, Free Pct: 10.1 %
PSA, Free: 0.69 ng/mL
Prostate Specific Ag, Serum: 6.8 ng/mL — ABNORMAL HIGH (ref 0.0–4.0)

## 2022-01-10 ENCOUNTER — Other Ambulatory Visit: Payer: Self-pay | Admitting: Urology

## 2022-01-10 DIAGNOSIS — R972 Elevated prostate specific antigen [PSA]: Secondary | ICD-10-CM

## 2022-01-15 DIAGNOSIS — H52223 Regular astigmatism, bilateral: Secondary | ICD-10-CM | POA: Diagnosis not present

## 2022-01-15 DIAGNOSIS — Z9841 Cataract extraction status, right eye: Secondary | ICD-10-CM | POA: Diagnosis not present

## 2022-01-15 DIAGNOSIS — Z9842 Cataract extraction status, left eye: Secondary | ICD-10-CM | POA: Diagnosis not present

## 2022-02-06 ENCOUNTER — Other Ambulatory Visit: Payer: Self-pay | Admitting: Cardiology

## 2022-02-06 DIAGNOSIS — Z298 Encounter for other specified prophylactic measures: Secondary | ICD-10-CM

## 2022-02-06 DIAGNOSIS — Z952 Presence of prosthetic heart valve: Secondary | ICD-10-CM

## 2022-02-08 ENCOUNTER — Other Ambulatory Visit: Payer: Self-pay

## 2022-02-08 DIAGNOSIS — Z2989 Encounter for other specified prophylactic measures: Secondary | ICD-10-CM

## 2022-02-08 DIAGNOSIS — Z298 Encounter for other specified prophylactic measures: Secondary | ICD-10-CM

## 2022-02-08 DIAGNOSIS — Z952 Presence of prosthetic heart valve: Secondary | ICD-10-CM

## 2022-02-08 MED ORDER — AMOXICILLIN 500 MG PO CAPS: 2000.0000 mg | ORAL_CAPSULE | Freq: Once | ORAL | 3 refills | Status: DC | PRN

## 2022-03-16 DIAGNOSIS — Z23 Encounter for immunization: Secondary | ICD-10-CM | POA: Diagnosis not present

## 2022-03-16 DIAGNOSIS — Z0000 Encounter for general adult medical examination without abnormal findings: Secondary | ICD-10-CM | POA: Diagnosis not present

## 2022-03-23 ENCOUNTER — Other Ambulatory Visit: Payer: Self-pay | Admitting: Cardiology

## 2022-04-02 ENCOUNTER — Ambulatory Visit: Payer: Medicare Other | Admitting: Student

## 2022-04-02 ENCOUNTER — Encounter: Payer: Self-pay | Admitting: Cardiology

## 2022-04-02 ENCOUNTER — Ambulatory Visit: Payer: Medicare Other | Admitting: Cardiology

## 2022-04-02 VITALS — BP 127/76 | HR 50 | Temp 98.4°F | Ht 74.0 in | Wt 222.8 lb

## 2022-04-02 DIAGNOSIS — Z952 Presence of prosthetic heart valve: Secondary | ICD-10-CM

## 2022-04-02 DIAGNOSIS — Z2989 Encounter for other specified prophylactic measures: Secondary | ICD-10-CM

## 2022-04-02 DIAGNOSIS — E7800 Pure hypercholesterolemia, unspecified: Secondary | ICD-10-CM

## 2022-04-02 DIAGNOSIS — I2510 Atherosclerotic heart disease of native coronary artery without angina pectoris: Secondary | ICD-10-CM

## 2022-04-02 DIAGNOSIS — G4733 Obstructive sleep apnea (adult) (pediatric): Secondary | ICD-10-CM | POA: Diagnosis not present

## 2022-04-02 MED ORDER — LOSARTAN POTASSIUM 25 MG PO TABS: 25.0000 mg | ORAL_TABLET | Freq: Every evening | ORAL | 3 refills | Status: DC

## 2022-04-02 NOTE — Progress Notes (Signed)
Primary Physician/Referring:  Celene Squibb, MD  Patient ID: Devon Sanchez, male    DOB: 1951-01-11, 71 y.o.   MRN: [id number]  Chief Complaint  Patient presents with   Coronary Artery Disease   Hypertension   Follow-up   HPI:    Devon Sanchez  is a 71 y.o. Caucasian male  with CAD S/P Prox LAD stent in 2007 and again  on 04/20/2013 underwent stenting to the proximal and mid LAD and proximal and mid circumflex. 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  This is annual visit, he is presently doing well and remains asymptomatic.   Past Medical History:  Diagnosis Date   Anxiety    Aortic root dilatation (HCC)    Bicuspid aortic valve 07/25/2018   Coronary artery disease    Depression    Erectile dysfunction    Hypercholesterolemia    Hyperglycemia    Hyperlipidemia    MI (myocardial infarction) ([place])    Reflux    Sleep apnea    Thoracic ascending aortic aneurysm [facility])    Past Surgical History:  Procedure Laterality Date   BENTALL PROCEDURE N/A 05/12/2020   Procedure: BENTALL PROCEDURE;  Surgeon: Wonda Olds, MD;  Location: [place];  Service: Open Heart Surgery;  Laterality: N/A;   COLONOSCOPY N/A 11/04/2014   Procedure: COLONOSCOPY;  Surgeon: Rogene Houston, MD;  [facility];  Service: Endoscopy;  Laterality: N/A;  830 - moved to 6/2 @ 10:30   CORONARY ANGIOPLASTY WITH STENT PLACEMENT     LEFT HEART CATHETERIZATION WITH CORONARY ANGIOGRAM N/A 04/20/2013   Procedure: LEFT HEART CATHETERIZATION WITH CORONARY ANGIOGRAM;  Surgeon: Laverda Page, MD;  Location: [facility] CATH LAB;  Service: Cardiovascular;  Laterality: N/A;   RIGHT/LEFT HEART CATH AND CORONARY ANGIOGRAPHY N/A 11/10/2019   Procedure: RIGHT/LEFT HEART CATH AND CORONARY  ANGIOGRAPHY;  Surgeon: Nigel Mormon, MD;  Location: [place] CV LAB;  Service: Cardiovascular;  Laterality: N/A;   TEE WITHOUT CARDIOVERSION N/A 05/12/2020   Procedure: TRANSESOPHAGEAL ECHOCARDIOGRAM (TEE);  Surgeon: Wonda Olds, MD;  Location: [place];  Service: Open Heart Surgery;  Laterality: N/A;   Family History  Problem Relation Age of Onset   Failure to thrive Mother    Diabetes Mother    Heart attack Father    Prostate cancer Father    Heart failure Father    Heart disease Father     Social History   Tobacco Use   Smoking status: Never   Smokeless tobacco: Never  Substance Use Topics   Alcohol use: No   ROS  Review of Systems  Cardiovascular:  Negative for chest pain, dyspnea on exertion and leg swelling.   Objective  Blood pressure 127/76, pulse (!) 50, temperature 98.4 F (36.9 C), temperature source Temporal, height '6\' 2"'$  (1.88 m), weight 222 lb 12.8 oz (101.1 kg), SpO2 96 %.     04/02/2022   11:30 AM 01/08/2022    2:26 PM 09/29/2021   11:37 AM  Vitals with BMI  Height '6\' 2"'$  '6\' 2"'$  '6\' 2"'$   Weight 222 lbs 13 oz 226 lbs 253 lbs  BMI 41.74 29 08.14  Systolic 481 856 314  Diastolic 76 76 83  Pulse 50 67 74     Physical Exam  Vitals reviewed.  Constitutional:      General: He is not in acute distress.    Appearance: He is obese.  Neck:     Vascular: Carotid bruit (bilateral) present. No JVD.  Cardiovascular:     Rate and Rhythm: Normal rate and regular rhythm.     Pulses: Normal pulses and intact distal pulses.     Heart sounds: S1 normal and S2 normal. No murmur heard.    No gallop.  Pulmonary:     Effort: Pulmonary effort is normal.     Breath sounds: Normal breath sounds. No rhonchi.  Chest:     Comments: Sternotomy scar noted Abdominal:     General: Bowel sounds are normal.     Palpations: Abdomen is soft.  Musculoskeletal:     Right lower leg: No edema.     Left lower leg: No edema.   Physical exam unchanged compared to previous  office visit.   Laboratory examination:  External labs:   External labs 12/08/2021:  A1c 6.0% Total cholesterol 131, triglycerides 110, HDL 31, LDL 80 BUN 21, creatinine 1.23, GFR >60, sodium 135, potassium 4.6 Hgb 14.4, HCT 43.6, MCV 93, platelet 174  09/06/2020: HDL 36, LDL 70, total cholesterol 126, triglycerides 106 A1c 6.3% BUN 14, creatinine 1.07, GFR 60  Allergies   Allergies  Allergen Reactions   Bupropion Other (See Comments)    MADE CHEST HURT   Naproxen Sodium Other (See Comments)    GI BLEEDING    Medications   Current Outpatient Medications:    acetaminophen (TYLENOL) 500 MG tablet, Take 500 mg by mouth every 6 (six) hours as needed for moderate pain or headache. , Disp: , Rfl:    aspirin EC 325 MG EC tablet, Take 1 tablet (325 mg total) by mouth daily., Disp: , Rfl:    carvedilol (COREG) 6.25 MG tablet, TAKE 1 TABLET BY MOUTH TWICE  DAILY WITH A MEAL, Disp: 180 tablet, Rfl: 3   escitalopram (LEXAPRO) 10 MG tablet, Take 1 tablet (10 mg total) by mouth daily. (Patient taking differently: Take 20 mg by mouth daily.), Disp: 30 tablet, Rfl: 2   Multiple Vitamins-Minerals (CENTRUM SILVER PO), Take 1 tablet by mouth every evening. , Disp: , Rfl:    nitroGLYCERIN (NITROSTAT) 0.4 MG SL tablet, Place 1 tablet (0.4 mg total) under the tongue every 5 (five) minutes as needed for up to 25 days for chest pain. (Patient taking differently: Place 0.4 mg under the tongue every 5 (five) minutes as needed for chest pain. Pt hasn't used in ~10 years, but keeps it on hand in case), Disp: 25 tablet, Rfl: 3   Omega-3 Fatty Acids (FISH OIL) 1200 MG CAPS, Take 3 capsules by mouth at bedtime., Disp: , Rfl:    omeprazole (PRILOSEC) 20 MG capsule, Take 1 capsule by mouth daily., Disp: , Rfl:    polyethylene glycol (MIRALAX / GLYCOLAX) 17 g packet, Take 17 g by mouth daily., Disp: , Rfl:    rosuvastatin (CRESTOR) 20 MG tablet, Take 1 tablet (20 mg total) by mouth daily., Disp: 90 tablet, Rfl: 3    losartan (COZAAR) 25 MG tablet, Take 1 tablet (25 mg total) by mouth every evening., Disp: 90 tablet, Rfl: 3   Radiology:   CT Chest 08/13/2019 1. A 5.0 cm dilation of ascending thoracic aorta, likely associated with aortic stenosis due to dense calcifications about the aortic valve. Ascending thoracic aortic aneurysm. Recommend semi-annual imaging followup by CTA or MRA and referral to cardiothoracic  surgery if not already obtained.   2. Coronary artery calcifications and signs of previous percutaneous coronary intervention. 3. 4 mm nodule in the left lung base. 4. 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  Cardiac Studies:   Coronary angiogram 04/20/2013: Non-ST elevation myocardial infarction 2. S/P Proximal and mid LAD 4.0 x 28 mm stent, proximal and mid circumflex 4.0 x 20 mm Promus Premier drug-eluting stent implantation. Mid LAD stent (09/30/2005 with 3.5 x 13 mmx2 overlapping stents) patent.  Coronary Angiogram 11/11/2019: LM: Normal LAD: Patent prox LAD stent with 20% late lumen loss at proximal edge LCx: Patent prox stent  [place]: Normal RCA: 95% stenosis in small RV marginal branch, unchanged compared to previous angiogram in 2014   Normal RHC. No pulmonary hypertension.  S/P AVR (aortic valve replacement) and aortoplasty-Bentall procedure 05/12/2020:  25 mm Bovine valve and 30 mm Hemashield gold graft.  Carotid artery duplex 06/29/2021:  Duplex suggests stenosis in the right internal carotid artery (minimal).  Duplex suggests stenosis in the left internal carotid artery (minimal).  Antegrade right vertebral artery flow. Antegrade left vertebral artery flow.  No significant change from 06/28/2020. Follow up studies if clinically indicated.   PCV ECHOCARDIOGRAM COMPLETE 85/07/7739 Normal LV systolic function with visual EF 60-65%. Left ventricle cavity is normal in size. Normal left ventricular wall thickness. Normal global wall motion. Normal diastolic  filling pattern, normal LAP. S/P Bentall procedure  Bioprosthetic valve is well-seated, no evidence of dehiscence, and no significant perivalvular regurgitation. No evidence of valvular stenosis or regurgitation. Mild pulmonic regurgitation. S/P Bentall procedure - aortic root and proximal ascending aorta are mildly dilated 37m. Compared to study 07/14/2020 LVEF remains preserved, G1DD is now normal, Ao root was 38mand now 3959m EKG:    EKG 04/02/2022: Normal sinus rhythm with rate of 70 bpm, right bundle branch block.  Single PVC.  Low-voltage complexes.  Compared to 09/29/2021, no change.  Assessment     ICD-10-CM   1. Coronary artery disease involving native coronary artery of native heart without angina pectoris  I25.10 EKG 12-Lead    2. S/P AVR (aortic valve replacement) and aortoplasty  Z95.2 losartan (COZAAR) 25 MG tablet    3. OSA on CPAP  G47.33 Ambulatory referral to Sleep Studies    4. Pure hypercholesterolemia  E78.00     5. SBE (subacute bacterial endocarditis) prophylaxis candidate  Z29.89       Meds ordered this encounter  Medications   losartan (COZAAR) 25 MG tablet    Sig: Take 1 tablet (25 mg total) by mouth every evening.    Dispense:  90 tablet    Refill:  3    Medications Discontinued During This Encounter  Medication Reason   losartan (COZAAR) 25 MG tablet    amoxicillin (AMOXIL) 500 MG capsule     Orders Placed This Encounter  Procedures   Ambulatory referral to Sleep Studies    Referral Priority:   Routine    Referral Type:   Consultation    Referral Reason:   Specialty Services Required    Referred to Provider:   DohLarey SeatD    Number of Visits Requested:   1   EKG 12-Lead    Recommendations:   ThoCORRION STIREWALTs a 71 88o. Caucasian male  with CAD S/P Prox LAD stent in 2007 and again  on 04/20/2013 underwent stenting to the proximal and mid LAD and proximal and mid circumflex. 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  This is annual visit, he is presently doing well and remains asymptomatic.  He is on appropriate medical therapy, losartan was somehow discontinued.  I have restarted the losartan.  Although there is questionable history of hypertension, he has not ever had elevated blood pressure and hence do not think he has hypertension.  He is trying his best to lose weight, this will certainly help with blood pressure control and also hyperglycemia.  He has OSA and is on CPAP and is compliant.  He has last seen Dr., Dohmeier in 2017, we will refer him back for reevaluation.  Lipids under excellent control.  He has not had any recurrence of angina pectoris.  His last echocardiogram was in January of this year, aortic root remained stable and aortic valve is functioning normally.  He does need endocarditis prophylaxis.  I will see him back on annual basis.     Adrian Prows, MD, [facility] 04/02/2022, 1:04 PM Office: [phone] Fax: [phone] Pager: [phone]

## 2022-04-06 ENCOUNTER — Other Ambulatory Visit: Payer: Self-pay

## 2022-04-06 MED ORDER — ROSUVASTATIN CALCIUM 20 MG PO TABS: 20.0000 mg | ORAL_TABLET | Freq: Every day | ORAL | 3 refills | Status: DC

## 2022-04-09 ENCOUNTER — Encounter: Payer: Self-pay | Admitting: Urology

## 2022-04-09 ENCOUNTER — Ambulatory Visit (INDEPENDENT_AMBULATORY_CARE_PROVIDER_SITE_OTHER): Payer: Medicare Other | Admitting: Urology

## 2022-04-09 VITALS — BP 128/74 | HR 67

## 2022-04-09 DIAGNOSIS — R972 Elevated prostate specific antigen [PSA]: Secondary | ICD-10-CM

## 2022-04-09 LAB — URINALYSIS, ROUTINE W REFLEX MICROSCOPIC
Bilirubin, UA: NEGATIVE
Glucose, UA: NEGATIVE
Ketones, UA: NEGATIVE
Leukocytes,UA: NEGATIVE
Nitrite, UA: NEGATIVE
RBC, UA: NEGATIVE
Specific Gravity, UA: 1.02 (ref 1.005–1.030)
Urobilinogen, Ur: 0.2 mg/dL (ref 0.2–1.0)
pH, UA: 5.5 (ref 5.0–7.5)

## 2022-04-09 LAB — MICROSCOPIC EXAMINATION
Bacteria, UA: NONE SEEN
RBC, Urine: NONE SEEN /hpf (ref 0–2)
WBC, UA: NONE SEEN /hpf (ref 0–5)

## 2022-04-09 NOTE — Progress Notes (Signed)
04/09/2022 9:49 AM   [name] 1951/04/20 [id number]  Referring provider: Celene Squibb, MD [address] [place],  [place] [address]  No chief complaint on file.   HPI: F/u -    1) PSA elevation -  patient with a July 2023 PSA of 7.1. Repeat PSA 6.8 in Aug 2023 with a 5 mm right base nodule on exam. No h/o prior PSA elevation or prostate biopsy.   No FH bx. Dad had BPH. No h/o BPH.   Today, seen for the above. I did order a prostate MRI to evaluate the PSA elevation and prostate nodule.    No blood thinners. CABG and aneurysm repair in 2021 at [facility].    He is on a diet and lost 33 lbs.    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    PMH: Past Medical History:  Diagnosis Date   Anxiety    Aortic root dilatation (HCC)    Bicuspid aortic valve 07/25/2018   Coronary artery disease    Depression    Erectile dysfunction    Hypercholesterolemia    Hyperglycemia    Hyperlipidemia    MI (myocardial infarction) ([place])    Reflux    Sleep apnea    Thoracic ascending aortic aneurysm [facility])     Surgical History: Past Surgical History:  Procedure Laterality Date   BENTALL PROCEDURE N/A 05/12/2020   Procedure: BENTALL PROCEDURE;  Surgeon: Wonda Olds, MD;  Location: [place];  Service: Open Heart Surgery;  Laterality: N/A;   COLONOSCOPY N/A 11/04/2014   Procedure: COLONOSCOPY;  Surgeon: Rogene Houston, MD;  [facility];  Service: Endoscopy;  Laterality: N/A;  830 - moved to 6/2 @ 10:30   CORONARY ANGIOPLASTY WITH STENT PLACEMENT     LEFT HEART CATHETERIZATION WITH CORONARY ANGIOGRAM N/A 04/20/2013   Procedure: LEFT HEART CATHETERIZATION WITH CORONARY ANGIOGRAM;  Surgeon: Laverda Page, MD;  Location[facility] CATH LAB;  Service: Cardiovascular;  Laterality: N/A;   RIGHT/LEFT HEART CATH AND CORONARY ANGIOGRAPHY N/A 11/10/2019   Procedure: RIGHT/LEFT HEART CATH AND CORONARY ANGIOGRAPHY;  Surgeon: Nigel Mormon, MD;  Location: [place] CV LAB;  Service:  Cardiovascular;  Laterality: N/A;   TEE WITHOUT CARDIOVERSION N/A 05/12/2020   Procedure: TRANSESOPHAGEAL ECHOCARDIOGRAM (TEE);  Surgeon: Wonda Olds, MD;  Location: [place];  Service: Open Heart Surgery;  Laterality: N/A;    Home Medications:  Allergies as of 04/09/2022       Reactions   Bupropion Other (See Comments)   MADE CHEST HURT   Naproxen Sodium Other (See Comments)   GI BLEEDING        Medication List        Accurate as of April 09, 2022  9:49 AM. If you have any questions, ask your nurse or doctor.          acetaminophen 500 MG tablet Commonly known as: TYLENOL Take 500 mg by mouth every 6 (six) hours as needed for moderate pain or headache.   aspirin EC 325 MG tablet Take 1 tablet (325 mg total) by mouth daily.   carvedilol 6.25 MG tablet Commonly known as: COREG TAKE 1 TABLET BY MOUTH TWICE  DAILY WITH A MEAL   CENTRUM SILVER PO Take 1 tablet by mouth every evening.   escitalopram 10 MG tablet Commonly known as: Lexapro Take 1 tablet (10 mg total) by mouth daily. What changed: how much to take   Fish Oil 1200 MG Caps  Take 3 capsules by mouth at bedtime.   losartan 25 MG tablet Commonly known as: COZAAR Take 1 tablet (25 mg total) by mouth every evening.   nitroGLYCERIN 0.4 MG SL tablet Commonly known as: NITROSTAT Place 1 tablet (0.4 mg total) under the tongue every 5 (five) minutes as needed for up to 25 days for chest pain. What changed: additional instructions   omeprazole 20 MG capsule Commonly known as: PRILOSEC Take 1 capsule by mouth daily.   polyethylene glycol 17 g packet Commonly known as: MIRALAX / GLYCOLAX Take 17 g by mouth daily.   rosuvastatin 20 MG tablet Commonly known as: CRESTOR Take 1 tablet (20 mg total) by mouth daily.        Allergies:  Allergies  Allergen Reactions   Bupropion Other (See Comments)    MADE CHEST HURT   Naproxen Sodium Other (See Comments)    GI BLEEDING    Family  History: Family History  Problem Relation Age of Onset   Failure to thrive Mother    Diabetes Mother    Heart attack Father    Prostate cancer Father    Heart failure Father    Heart disease Father     Social History:  reports that he has never smoked. He has never used smokeless tobacco. He reports that he does not drink alcohol and does not use drugs.   Physical Exam: BP 128/74   Pulse 67   Constitutional:  Alert and oriented, No acute distress. HEENT: [place] AT, moist mucus membranes.  Trachea midline, no masses. Cardiovascular: No clubbing, cyanosis, or edema. Respiratory: Normal respiratory effort, no increased work of breathing. GI: Abdomen is soft, nontender, nondistended, no abdominal masses GU: No CVA tenderness Skin: No rashes, bruises or suspicious lesions. Neurologic: Grossly intact, no focal deficits, moving all 4 extremities. Psychiatric: Normal mood and affect.  Laboratory Data: Lab Results  Component Value Date   WBC 6.8 05/18/2020   HGB 8.9 (L) 05/18/2020   HCT 26.2 (L) 05/18/2020   MCV 94.6 05/18/2020   PLT 175 05/18/2020    Lab Results  Component Value Date   CREATININE 1.22 05/19/2020    No results found for: "PSA"  No results found for: "TESTOSTERONE"  Lab Results  Component Value Date   HGBA1C 5.8 (H) 05/09/2020    Urinalysis    Component Value Date/Time   COLORURINE YELLOW 05/09/2020 1113   APPEARANCEUR Clear 01/08/2022 1447   LABSPEC 1.021 05/09/2020 1113   PHURINE 5.0 05/09/2020 1113   GLUCOSEU Negative 01/08/2022 [place] 05/09/2020 1113   BILIRUBINUR Negative 01/08/2022 1447   KETONESUR NEGATIVE 05/09/2020 1113   PROTEINUR Negative 01/08/2022 [place] 05/09/2020 1113   NITRITE Negative 01/08/2022 1447   NITRITE NEGATIVE 05/09/2020 1113   LEUKOCYTESUR Negative 01/08/2022 1447   LEUKOCYTESUR NEGATIVE 05/09/2020 1113    Lab Results  Component Value Date   LABMICR Comment 01/08/2022     Pertinent Imaging:  N/a  Assessment & Plan:    1. Elevated PSA We discussed his PSA elevation and again the nature of PSA elevation. Discussed the nature r/b of bx or MRI. He reports no one called him to schedule the MRI. We discussed the utility of MRI and as his prostate was not significantly large on exam we will proceed with prostate bx if PSA remains elevated.  - Urinalysis, Routine w reflex microscopic   No follow-ups on file.  Festus Aloe, MD  [facility] [place]  [place], [place] [address] [phone]

## 2022-04-10 ENCOUNTER — Telehealth: Payer: Self-pay

## 2022-04-10 DIAGNOSIS — R972 Elevated prostate specific antigen [PSA]: Secondary | ICD-10-CM

## 2022-04-10 LAB — PSA, TOTAL AND FREE
PSA, Free Pct: 13.3 %
PSA, Free: 0.85 ng/mL
Prostate Specific Ag, Serum: 6.4 ng/mL — ABNORMAL HIGH (ref 0.0–4.0)

## 2022-04-10 MED ORDER — LEVOFLOXACIN 750 MG PO TABS: 750.0000 mg | ORAL_TABLET | Freq: Once | ORAL | 0 refills | Status: AC

## 2022-04-10 NOTE — Telephone Encounter (Signed)
Patient called and made aware of elevated psa and the need for prostate biopsy. Prostate biopsy instructions went over with patient via phone and sent via mail. Orders placed.

## 2022-04-10 NOTE — Telephone Encounter (Signed)
-----   Message from Festus Aloe, MD sent at 04/10/2022 12:34 PM EST ----- Please let Devon Sanchez know his PSA remains elevated so I recommend we proceed with a prostate biopsy. Please go over instructions and get clearance to stop anticogulation from Dr. Einar Gip. Please schedule prostate biopsy and a 2 week follow-up appointment for me to go over results. Thank you.    ----- Message ----- From: Iris Pert, LPN Sent: 03/0/0923   9:37 AM EST To: Festus Aloe, MD  Please review

## 2022-04-17 ENCOUNTER — Encounter: Payer: Self-pay | Admitting: Cardiology

## 2022-05-07 ENCOUNTER — Encounter (HOSPITAL_COMMUNITY): Payer: Self-pay

## 2022-05-07 ENCOUNTER — Ambulatory Visit (HOSPITAL_BASED_OUTPATIENT_CLINIC_OR_DEPARTMENT_OTHER): Payer: Medicare Other | Admitting: Urology

## 2022-05-07 ENCOUNTER — Ambulatory Visit (HOSPITAL_COMMUNITY)
Admission: RE | Admit: 2022-05-07 | Discharge: 2022-05-07 | Disposition: A | Discharge status: Home / Self Care | Payer: Medicare Other | Source: Ambulatory Visit | Attending: Urology | Admitting: Urology

## 2022-05-07 ENCOUNTER — Other Ambulatory Visit: Payer: Self-pay | Admitting: Urology

## 2022-05-07 DIAGNOSIS — C61 Malignant neoplasm of prostate: Secondary | ICD-10-CM | POA: Diagnosis not present

## 2022-05-07 DIAGNOSIS — R972 Elevated prostate specific antigen [PSA]: Secondary | ICD-10-CM

## 2022-05-07 MED ORDER — GENTAMICIN SULFATE 40 MG/ML IJ SOLN: 160.0000 mg | Freq: Once | INTRAMUSCULAR | Status: AC

## 2022-05-07 MED ORDER — GENTAMICIN SULFATE 40 MG/ML IJ SOLN
INTRAMUSCULAR | Status: AC
  Administered 2022-05-07: 160 mg via INTRAMUSCULAR
  Filled 2022-05-07: qty 4

## 2022-05-07 MED ORDER — LIDOCAINE HCL (PF) 2 % IJ SOLN
INTRAMUSCULAR | Status: AC
  Filled 2022-05-07: qty 10

## 2022-05-07 NOTE — Progress Notes (Signed)
Prostate Biopsy Procedure   F/u -    1) PSA elevation -  patient with a July 2023 PSA of 7.1. Repeat PSA 6.8 in Aug 2023 with a 5 mm right base nodule on exam. No h/o prior PSA elevation or prostate biopsy.    No FH bx. Dad had BPH. No h/o BPH.    Today, seen for the above for prostate biopsy. His Nov 2023 PSA was 6.4. No dysuria or gross hematuria.    No blood thinners. CABG and aneurysm repair in 2021 at [facility]    He is on a diet and lost 33 lbs.    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   Informed consent was obtained after discussing risks/benefits of the procedure.  A time out was performed to ensure correct patient identity.  Pre-Procedure: - Last PSA Level: No results found for: "PSA" - Gentamicin given prophylactically - Levaquin 500 mg administered PO -DRE: right base nodule (T2) -Transrectal Ultrasound performed revealing a 47 g prostate noted (l 5.3, h 3.4, w 5 cm) -No significant hypoechoic or median lobe noted  Procedure: - Prostate block performed using 10 cc 1% lidocaine and biopsies taken from sextant areas, a total of 12 under ultrasound guidance.  Post-Procedure: - Patient tolerated the procedure well - He was counseled to seek immediate medical attention if experiences any severe pain, significant bleeding, or fevers - Return in one week to discuss biopsy results

## 2022-05-07 NOTE — Progress Notes (Signed)
Prostate biopsy complete no signs of distress.  

## 2022-05-14 ENCOUNTER — Telehealth: Payer: Self-pay | Admitting: Urology

## 2022-05-14 NOTE — Telephone Encounter (Signed)
I called and spoke to Ray and his wife. He is doing well. We went over the + prostate bx results and his stage, grade and prognosis.   We discussed the nature r/b of surgery and radiation and he will be considering his options. I encouraged him to look at his path report on Mychart. Also discussed AS. He will follow-up as planned.   Dec 2023 Int risk PCa PSA 7.1 T2a (right nodule) Prostate 47 g GG2, five cores, 10-90% (right up to 90%) GG1, three cores, 5-10% 8/12

## 2022-05-21 ENCOUNTER — Encounter: Payer: Self-pay | Admitting: Urology

## 2022-05-21 ENCOUNTER — Ambulatory Visit: Payer: Medicare Other | Admitting: Urology

## 2022-05-21 VITALS — BP 110/65 | HR 73 | Ht 74.0 in | Wt 222.8 lb

## 2022-05-21 DIAGNOSIS — C61 Malignant neoplasm of prostate: Secondary | ICD-10-CM

## 2022-05-21 NOTE — Progress Notes (Unsigned)
[facility]  [address] [place], [place] [address] [phone]  05/21/2022 3:33 PM   Devon Sanchez 01-07-1951 [id number]  Referring provider: Celene Squibb, MD [address] [place],  [place] [address]  No chief complaint on file.   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   Biopsy: Dec 2023 Int risk PCa PSA 7.1 T2a (right nodule) Prostate 47 g GG2, five cores, 10-90% (right up to 90%) GG1, three cores, 5-10% 8/12  No FH bx. Dad had BPH. No h/o BPH.    Today, seen for the above for prostate biopsy. His Nov 2023 PSA was 6.4. No dysuria or gross hematuria.    No blood thinners. CABG and aneurysm repair in 2021 at [facility]. He is on a diet and lost 33 lbs.    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   PMH: Past Medical History:  Diagnosis Date   Anxiety    Aortic root dilatation (HCC)    Bicuspid aortic valve 07/25/2018   Coronary artery disease    Depression    Erectile dysfunction    Hypercholesterolemia    Hyperglycemia    Hyperlipidemia    MI (myocardial infarction) ([place])    Reflux    Sleep apnea    Thoracic ascending aortic aneurysm [facility])     Surgical History: Past Surgical History:  Procedure Laterality Date   BENTALL PROCEDURE N/A 05/12/2020   Procedure: BENTALL PROCEDURE;  Surgeon: Wonda Olds, MD;  Location: [place];  Service: Open Heart Surgery;  Laterality: N/A;   COLONOSCOPY N/A 11/04/2014   Procedure: COLONOSCOPY;  Surgeon: Rogene Houston, MD;  [facility];  Service: Endoscopy;  Laterality: N/A;  830 - moved to 6/2 @ 10:30   CORONARY ANGIOPLASTY WITH STENT PLACEMENT     LEFT HEART CATHETERIZATION WITH CORONARY ANGIOGRAM N/A 04/20/2013   Procedure: LEFT HEART CATHETERIZATION WITH CORONARY ANGIOGRAM;  Surgeon: Laverda Page, MD;  Location: [facility] CATH LAB;  Service: Cardiovascular;  Laterality: N/A;   RIGHT/LEFT HEART CATH AND CORONARY  ANGIOGRAPHY N/A 11/10/2019   Procedure: RIGHT/LEFT HEART CATH AND CORONARY ANGIOGRAPHY;  Surgeon: Nigel Mormon, MD;  Location: [place] CV LAB;  Service: Cardiovascular;  Laterality: N/A;   TEE WITHOUT CARDIOVERSION N/A 05/12/2020   Procedure: TRANSESOPHAGEAL ECHOCARDIOGRAM (TEE);  Surgeon: Wonda Olds, MD;  Location: [place];  Service: Open Heart Surgery;  Laterality: N/A;    Home Medications:  Allergies as of 05/21/2022       Reactions   Bupropion Other (See Comments)   MADE CHEST HURT   Naproxen Sodium Other (See Comments)   GI BLEEDING        Medication List        Accurate as of May 21, 2022  3:33 PM. If you have any questions, ask your nurse or doctor.          acetaminophen 500 MG tablet Commonly known as: TYLENOL Take 500 mg by mouth every 6 (six) hours as needed for moderate pain or headache.   aspirin EC 325 MG tablet Take 1 tablet (325 mg total) by mouth daily.   carvedilol 6.25 MG tablet Commonly known as: COREG TAKE 1 TABLET BY MOUTH TWICE  DAILY WITH A MEAL   CENTRUM SILVER PO Take 1 tablet by mouth every evening.   escitalopram 10 MG tablet Commonly known as: Lexapro Take 1 tablet (10  mg total) by mouth daily. What changed: how much to take   Fish Oil 1200 MG Caps Take 3 capsules by mouth at bedtime.   losartan 25 MG tablet Commonly known as: COZAAR Take 1 tablet (25 mg total) by mouth every evening.   nitroGLYCERIN 0.4 MG SL tablet Commonly known as: NITROSTAT Place 1 tablet (0.4 mg total) under the tongue every 5 (five) minutes as needed for up to 25 days for chest pain. What changed: additional instructions   omeprazole 20 MG capsule Commonly known as: PRILOSEC Take 1 capsule by mouth daily.   polyethylene glycol 17 g packet Commonly known as: MIRALAX / GLYCOLAX Take 17 g by mouth daily.   rosuvastatin 20 MG tablet Commonly known as: CRESTOR Take 1 tablet (20 mg total) by mouth daily.   VITAMIN B 12 PO Take by  mouth.        Allergies:  Allergies  Allergen Reactions   Bupropion Other (See Comments)    MADE CHEST HURT   Naproxen Sodium Other (See Comments)    GI BLEEDING    Family History: Family History  Problem Relation Age of Onset   Failure to thrive Mother    Diabetes Mother    Heart attack Father    Prostate cancer Father    Heart failure Father    Heart disease Father     Social History:  reports that he has never smoked. He has never used smokeless tobacco. He reports that he does not drink alcohol and does not use drugs.   Physical Exam: BP 110/65   Pulse 73   Ht '6\' 2"'$  (1.88 m)   Wt 222 lb 12.8 oz (101.1 kg)   BMI 28.61 kg/m   Constitutional:  Alert and oriented, No acute distress. HEENT: [place] AT, moist mucus membranes.  Trachea midline, no masses. Cardiovascular: No clubbing, cyanosis, or edema. Respiratory: Normal respiratory effort, no increased work of breathing. GI: Abdomen is soft, nontender, nondistended, no abdominal masses GU: No CVA tenderness Skin: No rashes, bruises or suspicious lesions. Neurologic: Grossly intact, no focal deficits, moving all 4 extremities. Psychiatric: Normal mood and affect.  Laboratory Data: Lab Results  Component Value Date   WBC 6.8 05/18/2020   HGB 8.9 (L) 05/18/2020   HCT 26.2 (L) 05/18/2020   MCV 94.6 05/18/2020   PLT 175 05/18/2020    Lab Results  Component Value Date   CREATININE 1.22 05/19/2020    No results found for: "PSA"  No results found for: "TESTOSTERONE"  Lab Results  Component Value Date   HGBA1C 5.8 (H) 05/09/2020    Urinalysis    Component Value Date/Time   COLORURINE YELLOW 05/09/2020 1113   APPEARANCEUR Clear 04/09/2022 0945   LABSPEC 1.021 05/09/2020 1113   PHURINE 5.0 05/09/2020 1113   GLUCOSEU Negative 04/09/2022 0945   HGBUR NEGATIVE 05/09/2020 1113   BILIRUBINUR Negative 04/09/2022 0945   KETONESUR NEGATIVE 05/09/2020 1113   PROTEINUR 1+ (A) 04/09/2022 0945   PROTEINUR  NEGATIVE 05/09/2020 1113   NITRITE Negative 04/09/2022 0945   NITRITE NEGATIVE 05/09/2020 1113   LEUKOCYTESUR Negative 04/09/2022 0945   LEUKOCYTESUR NEGATIVE 05/09/2020 1113    Lab Results  Component Value Date   LABMICR See below: 04/09/2022   WBCUA None seen 04/09/2022   LABEPIT 0-10 04/09/2022   BACTERIA None seen 04/09/2022      Assessment & Plan:    Prostate cancer - I had a long discussion with the patient using his path report as a reference.  We went his stage, grade and prognosis and the relevant anatomy. We discussed the nature risks and benefits of active surveillance, radical prostatectomy, IMRT (+/- brachytherapy, +/- ADT). We discussed specifically how each treatment might affect the bowel, bladder and sexual function. We discussed how each treatment might effect salvage treatments. We discussed the role of other modalities in the treatment of prostate cancer including chemotherapy, HIFU and cryotherapy - focal vs whole gland.   He is very interested in surgery. He would like to "get it out". I will refer to Dr. Alinda Money for discussion.   Recent visit with Dr. Einar Gip. Normal echo and EF.   No follow-ups on file.  Festus Aloe, MD  [facility] [address] [place], [place] [address] [phone]

## 2022-06-11 DIAGNOSIS — R7303 Prediabetes: Secondary | ICD-10-CM | POA: Diagnosis not present

## 2022-06-11 DIAGNOSIS — E782 Mixed hyperlipidemia: Secondary | ICD-10-CM | POA: Diagnosis not present

## 2022-06-15 DIAGNOSIS — Z952 Presence of prosthetic heart valve: Secondary | ICD-10-CM | POA: Diagnosis not present

## 2022-06-15 DIAGNOSIS — E782 Mixed hyperlipidemia: Secondary | ICD-10-CM | POA: Diagnosis not present

## 2022-06-15 DIAGNOSIS — R5383 Other fatigue: Secondary | ICD-10-CM | POA: Diagnosis not present

## 2022-06-15 DIAGNOSIS — I6522 Occlusion and stenosis of left carotid artery: Secondary | ICD-10-CM | POA: Diagnosis not present

## 2022-06-15 DIAGNOSIS — D509 Iron deficiency anemia, unspecified: Secondary | ICD-10-CM | POA: Diagnosis not present

## 2022-06-15 DIAGNOSIS — I129 Hypertensive chronic kidney disease with stage 1 through stage 4 chronic kidney disease, or unspecified chronic kidney disease: Secondary | ICD-10-CM | POA: Diagnosis not present

## 2022-06-15 DIAGNOSIS — G4733 Obstructive sleep apnea (adult) (pediatric): Secondary | ICD-10-CM | POA: Diagnosis not present

## 2022-06-15 DIAGNOSIS — I712 Thoracic aortic aneurysm, without rupture, unspecified: Secondary | ICD-10-CM | POA: Diagnosis not present

## 2022-06-15 DIAGNOSIS — N182 Chronic kidney disease, stage 2 (mild): Secondary | ICD-10-CM | POA: Diagnosis not present

## 2022-06-15 DIAGNOSIS — I2510 Atherosclerotic heart disease of native coronary artery without angina pectoris: Secondary | ICD-10-CM | POA: Diagnosis not present

## 2022-06-15 DIAGNOSIS — R7303 Prediabetes: Secondary | ICD-10-CM | POA: Diagnosis not present

## 2022-06-27 ENCOUNTER — Encounter: Payer: Self-pay | Admitting: Neurology

## 2022-06-27 ENCOUNTER — Ambulatory Visit: Payer: Medicare Other | Admitting: Neurology

## 2022-06-27 VITALS — BP 118/76 | HR 76 | Ht 74.0 in | Wt 248.0 lb

## 2022-06-27 DIAGNOSIS — G4731 Primary central sleep apnea: Secondary | ICD-10-CM

## 2022-06-27 DIAGNOSIS — M314 Aortic arch syndrome [Takayasu]: Secondary | ICD-10-CM | POA: Diagnosis not present

## 2022-06-27 DIAGNOSIS — G4733 Obstructive sleep apnea (adult) (pediatric): Secondary | ICD-10-CM | POA: Diagnosis not present

## 2022-06-27 DIAGNOSIS — C61 Malignant neoplasm of prostate: Secondary | ICD-10-CM

## 2022-06-27 DIAGNOSIS — I7122 Aneurysm of the aortic arch, without rupture: Secondary | ICD-10-CM

## 2022-06-27 DIAGNOSIS — I2510 Atherosclerotic heart disease of native coronary artery without angina pectoris: Secondary | ICD-10-CM | POA: Diagnosis not present

## 2022-06-27 DIAGNOSIS — Z952 Presence of prosthetic heart valve: Secondary | ICD-10-CM

## 2022-06-27 NOTE — Progress Notes (Signed)
SLEEP MEDICINE CLINIC    Provider:  Larey Seat, MD  Primary Care Physician:  Celene Squibb, MD [place] [address]     Referring Provider: Adrian Prows, [place],  [place] [address]          Chief Complaint according to patient   Patient presents with:     New Patient (Initial Visit)           HISTORY OF PRESENT ILLNESS:  Devon Sanchez is a 72 y.o. year old White or Caucasian male patient seen here as a referral on 06/27/2022 from Dr Einar Gip  for a new sleep evaluation .  Chief concern according to patient :  " I had open heart surgery, Aortic Valve replacement in 05-2020, and I still use my cpap" .    I have the pleasure of seeing Devon Sanchez , who has a  has a past medical history of Anxiety, Aortic root dilatation (HCC), Bicuspid aortic valve (07/25/2018), Coronary artery disease, Depression, Erectile dysfunction, Hypercholesterolemia, Hyperglycemia, Hyperlipidemia, MI (myocardial infarction) ([place]), Reflux, Sleep apnea, and Thoracic ascending aortic aneurysm ([place]).   The patient had the last sleep study in January of the year 2017  -the patient has a history of thoracic ascending aortic aneurysm, sleep apnea, history of gastric reflux myocardial infarction, hyperglycemia, hyperlipidemia, a bicuspid aortic valve was replaced in December 2021 he also had aortic root dilation that needed to be corrected.  He is was recently seen by Dr. Einar Gip and according to Dr. Einar Gip the patient is doing very well he also feels less fatigued and physically more refreshed.  Dr. Einar Gip suggested to repeat his sleep evaluation.  I had seen the patient last on 12-06-2016 at the time he had endorsed 100% compliance with his CPAP use 95th percentile pressure was 10 cm water residual AHI at the time was 1.3/h.  He had lost 45 pounds at the time in comparison to the time of the sleep study.  He reported to have very vivid dreams sometimes emotionally taxing  dreams and he did sometimes struggle with daytime sleepiness but not to narcoleptic levels.  He had worked as a Associate Professor.  The fatigue severity scale in 2018 was endorsed at 26 points on the Epworth Sleepiness Scale at 9 points.  He still uses his CPAP from 2017.  He is also a very compliant user he has used the machine for the last 30 days 100% and on 25 of those days for over 4 hours consecutively the average user time is 5 hours 47 minutes.  The pressure ranges between 7 and 10 cm water with 3 cm PVR.  The residual AHI currently is 3.8 95th percentile pressure 9.9 cm water and there are most centrals and obstructive's among his residual apneas.  The air leak is low.  So in order to replace his machine but also to pay attention to the medical changes of the meantime I would like to repeat his sleep study.  In 2017 he underwent a CPAP titration polysomnography following a nocturnal PSG.  He did best there at 9 cm water pressure and he had REM rebound interestingly REM sleep was not associated with any apnea which tells me that there were complex and central apneas present.       Family medical /sleep history: daughter with depression, grandson with suicide other family member on CPAP with OSA, insomnia, sleep walkers.  Social history:  Patient is retired from Associate Professor in 2019  and lives in a household with spouse,  one dog.  Tobacco use- none .  ETOH use - none ,  Caffeine intake in form of Coffee( 2 cups a day)  or energy drinks. Regular exercise in form of walking .   Hobbies :Architect, home improvement.      Sleep habits are as follows: The patient's dinner time is between 6 PM.  The patient goes to bed at 10-11 PM but continues to sleep for 6-7 hours, wakes for one bathroom breaks, the first time at 2-3 AM.  He uses his CPAP.   The preferred sleep position is lateral , with the support of 1 pillow. Dreams are reportedly frequent/vivid.  9  AM is the usual rise  time. The patient wakes up spontaneously.  He reports feeling refreshed or restored in AM, with symptoms such as dry mouth, no morning headaches, and residual fatigue.  Naps are taken infrequently.  Review of Systems: Out of a complete 14 system review, the patient complains of only the following symptoms, and all other reviewed systems are negative.:  Fatigue, sleepiness , snoring, fragmented sleep, no insomnia on CPAP . Right shoulder pain.    How likely are you to doze in the following situations: 0 = not likely, 1 = slight chance, 2 = moderate chance, 3 = high chance   Sitting and Reading? 2 Watching Television? 2 Sitting inactive in a public place (theater or meeting)? As a passenger in a car for an hour without a break? Lying down in the afternoon when circumstances permit? Sitting and talking to someone? Sitting quietly after lunch without alcohol? In a car, while stopped for a few minutes in traffic?   Total =  11 / 24 points   FSS endorsed at 15/ 63 points.   Gds - 5/ 15.  Social History   Socioeconomic History   Marital status: Married    Spouse name: Not on file   Number of children: 4   Years of education: Not on file   Highest education level: Not on file  Occupational History   Not on file  Tobacco Use   Smoking status: Never   Smokeless tobacco: Never  Vaping Use   Vaping Use: Never used  Substance and Sexual Activity   Alcohol use: No   Drug use: No   Sexual activity: Never  Other Topics Concern   Not on file  Social History Narrative   Drinks coffee occasionally.   Social Determinants of Health   Financial Resource Strain: Not on file  Food Insecurity: Not on file  Transportation Needs: Not on file  Physical Activity: Not on file  Stress: Not on file  Social Connections: Not on file    Family History  Problem Relation Age of Onset   Failure to thrive Mother    Diabetes Mother    Heart attack Father    Prostate cancer Father    Heart  failure Father    Heart disease Father     Past Medical History:  Diagnosis Date   Anxiety    Aortic root dilatation ([place])    Bicuspid aortic valve 07/25/2018   Coronary artery disease    Depression    Erectile dysfunction    Hypercholesterolemia    Hyperglycemia    Hyperlipidemia    MI (myocardial infarction) ([place])    Reflux    Sleep apnea    Thoracic ascending aortic aneurysm (  [facility])     Past Surgical History:  Procedure Laterality Date   BENTALL PROCEDURE N/A 05/12/2020   Procedure: BENTALL PROCEDURE;  Surgeon: Wonda Olds, MD;  Location: [place];  Service: Open Heart Surgery;  Laterality: N/A;   COLONOSCOPY N/A 11/04/2014   Procedure: COLONOSCOPY;  Surgeon: Rogene Houston, MD;  [facility];  Service: Endoscopy;  Laterality: N/A;  830 - moved to 6/2 @ 10:30   CORONARY ANGIOPLASTY WITH STENT PLACEMENT     LEFT HEART CATHETERIZATION WITH CORONARY ANGIOGRAM N/A 04/20/2013   Procedure: LEFT HEART CATHETERIZATION WITH CORONARY ANGIOGRAM;  Surgeon: Laverda Page, MD;  [facility] CATH LAB;  Service: Cardiovascular;  Laterality: N/A;   RIGHT/LEFT HEART CATH AND CORONARY ANGIOGRAPHY N/A 11/10/2019   Procedure: RIGHT/LEFT HEART CATH AND CORONARY ANGIOGRAPHY;  Surgeon: Nigel Mormon, MD;  Location: [place] CV LAB;  Service: Cardiovascular;  Laterality: N/A;   TEE WITHOUT CARDIOVERSION N/A 05/12/2020   Procedure: TRANSESOPHAGEAL ECHOCARDIOGRAM (TEE);  Surgeon: Wonda Olds, MD;  Location: [place];  Service: Open Heart Surgery;  Laterality: N/A;     Current Outpatient Medications on File Prior to Visit  Medication Sig Dispense Refill   acetaminophen (TYLENOL) 500 MG tablet Take 500 mg by mouth every 6 (six) hours as needed for moderate pain or headache.      aspirin EC 325 MG EC tablet Take 1 tablet (325 mg total) by mouth daily.     carvedilol (COREG) 6.25 MG tablet TAKE 1 TABLET BY MOUTH TWICE  DAILY WITH A MEAL 180 tablet 3   losartan (COZAAR) 25 MG  tablet Take 1 tablet (25 mg total) by mouth every evening. 90 tablet 3   Multiple Vitamins-Minerals (CENTRUM SILVER PO) Take 1 tablet by mouth every evening.      Omega-3 Fatty Acids (FISH OIL) 1200 MG CAPS Take 3 capsules by mouth at bedtime.     omeprazole (PRILOSEC) 20 MG capsule Take 1 capsule by mouth daily.     polyethylene glycol (MIRALAX / GLYCOLAX) 17 g packet Take 17 g by mouth daily.     rosuvastatin (CRESTOR) 20 MG tablet Take 1 tablet (20 mg total) by mouth daily. 90 tablet 3   escitalopram (LEXAPRO) 10 MG tablet Take 1 tablet (10 mg total) by mouth daily. (Patient taking differently: Take 20 mg by mouth daily.) 30 tablet 2   nitroGLYCERIN (NITROSTAT) 0.4 MG SL tablet Place 1 tablet (0.4 mg total) under the tongue every 5 (five) minutes as needed for up to 25 days for chest pain. (Patient taking differently: Place 0.4 mg under the tongue every 5 (five) minutes as needed for chest pain. Pt hasn't used in ~10 years, but keeps it on hand in case) 25 tablet 3   No current facility-administered medications on file prior to visit.    Allergies  Allergen Reactions   Bupropion Other (See Comments)    MADE CHEST HURT   Naproxen Sodium Other (See Comments)    GI BLEEDING    Physical exam:  Today's Vitals   06/27/22 1053  BP: 118/76  Pulse: 76  Weight: 248 lb (112.5 kg)  Height: '6\' 2"'$  (1.88 m)   Body mass index is 31.84 kg/m.   Wt Readings from Last 3 Encounters:  06/27/22 248 lb (112.5 kg)  05/21/22 222 lb 12.8 oz (101.1 kg)  04/02/22 222 lb 12.8 oz (101.1 kg)     Ht Readings from Last 3 Encounters:  06/27/22 '6\' 2"'$  (1.88 m)  05/21/22 '6\' 2"'$  (1.88 m)  04/02/22 '6\' 2"'$  (1.88 m)      General: The patient is awake, alert and appears not in acute distress. The patient is well groomed. Head: Normocephalic, atraumatic. Neck is supple. Mallampati 1,  neck circumference:17.25 inches . Nasal airflow  patent.  Retrognathia is  seen.  Dental status: biological, sinus  congestion. Cardiovascular:  Regular rate and cardiac rhythm by pulse,  without distended neck veins. Respiratory: Lungs are clear to auscultation.  Skin:  Without evidence of ankle edema, or rash. Trunk: The patient's posture is erect.   Neurologic exam : The patient is awake and alert, oriented to place and time.   Memory subjective described as intact.  Attention span & concentration ability appears normal.  Speech is fluent,  without  dysarthria, dysphonia or aphasia.  Mood and affect are appropriate.   Cranial nerves: no loss of smell or taste reported  Pupils are equal and briskly reactive to light. Funduscopic exam deferred. .  Extraocular movements in vertical and horizontal planes were intact and without nystagmus.  No Diplopia. Visual fields by finger perimetry are intact. Hearing was intact to soft voice and finger rubbing.   Facial sensation intact to fine touch. Facial motor strength is symmetric and tongue and uvula move midline.  Neck ROM : rotation, tilt and flexion extension were normal for age and shoulder shrug was symmetrical.    Motor exam:  Symmetric bulk, tone and ROM.   Normal tone without cog-wheeling, symmetric grip strength .   Sensory:  Fine touch and vibration were intact.  Proprioception tested in the upper extremities was normal.   Coordination: Rapid alternating movements in the fingers/hands were of normal speed.  The Finger-to-nose maneuver was intact without evidence of ataxia, dysmetria or tremor.   Gait and station: Patient could rise unassisted from a seated position, walked without assistive device.  Stance is of normal width/ base and the patient turned with 3 steps.  Toe and heel walk were deferred.  Deep tendon reflexes: in the  upper and lower extremities are symmetric and intact.  Babinski response was deferred.      After spending a total time of  45  minutes face to face and additional time for physical and neurologic examination,  review of laboratory studies,  personal review of imaging studies, reports and results of other testing and review of referral information / records as far as provided in visit, I have established the following assessments:  1)  patient with history of complex and central apnea treated on CPAP. Machine needs replacement.  2)  he had a lot of medical developments  justifying a re-evaluation.  3) BMI  31.8 l kg/m2.    My Plan is to proceed with:  1) invite to PSG Split at [facility] 10/h  2) alternatively HST to document a new baseline.  3) weight loss is still needed.   I would like to thank Celene Squibb, MD and Adrian Prows, [place],  [place] [address] for allowing me to meet with and to take care of this pleasant patient.   In short, Devon Sanchez is presenting with an old CPAP machine and reports sleeping better on CPAP, the machine needs replaced.  I plan to follow up either personally or through our NP within 3-4 months.   CC: I will share my notes with Dr Einar Gip.  Electronically signed by: Larey Seat, MD 06/27/2022 11:19 AM  [facility]  and [employer] certified by [employer] of Sleep Medicine and Diplomate of the [employer] of Sleep Medicine. Board certified In Neurology through the [place], Fellow of the [employer] of Neurology. Medical Director of [employer].

## 2022-06-27 NOTE — Patient Instructions (Signed)
Living With Sleep Apnea Sleep apnea is a condition in which breathing pauses or becomes shallow during sleep. Sleep apnea is most commonly caused by a collapsed or blocked airway. People with sleep apnea usually snore loudly. They may have times when they gasp and stop breathing for 10 seconds or more during sleep. This may happen many times during the night. The breaks in breathing also interrupt the deep sleep that you need to feel rested. Even if you do not completely wake up from the gaps in breathing, your sleep may not be restful and you feel tired during the day. You may also have a headache in the morning and low energy during the day, and you may feel anxious or depressed. How can sleep apnea affect me? Sleep apnea increases your chances of extreme tiredness during the day (daytime fatigue). It can also increase your risk for health conditions, such as: Heart attack. Stroke. Obesity. Type 2 diabetes. Heart failure. Irregular heartbeat. High blood pressure. If you have daytime fatigue as a result of sleep apnea, you may be more likely to: Perform poorly at school or work. Fall asleep while driving. Have difficulty with attention. Develop depression or anxiety. Have sexual dysfunction. What actions can I take to manage sleep apnea? Sleep apnea treatment  If you were given a device to open your airway while you sleep, use it only as told by your health care provider. You may be given: An oral appliance. This is a custom-made mouthpiece that shifts your lower jaw forward. A continuous positive airway pressure (CPAP) device. This device blows air through a mask when you breathe out (exhale). A nasal expiratory positive airway pressure (EPAP) device. This device has valves that you put into each nostril. A bi-level positive airway pressure (BIPAP) device. This device blows air through a mask when you breathe in (inhale) and breathe out (exhale). You may need surgery if other treatments  do not work for you. Sleep habits Go to sleep and wake up at the same time every day. This helps set your internal clock (circadian rhythm) for sleeping. If you stay up later than usual, such as on weekends, try to get up in the morning within 2 hours of your normal wake time. Try to get at least 7-9 hours of sleep each night. Stop using a computer, tablet, and mobile phone a few hours before bedtime. Do not take long naps during the day. If you nap, limit it to 30 minutes. Have a relaxing bedtime routine. Reading or listening to music may relax you and help you sleep. Use your bedroom only for sleep. Keep your television and computer out of your bedroom. Keep your bedroom cool, dark, and quiet. Use a supportive mattress and pillows. Follow your health care provider's instructions for other changes to sleep habits. Nutrition Do not eat heavy meals in the evening. Do not have caffeine in the later part of the day. The effects of caffeine can last for more than 5 hours. Follow your health care provider's or dietitian's instructions for any diet changes. Lifestyle     Do not drink alcohol before bedtime. Alcohol can cause you to fall asleep at first, but then it can cause you to wake up in the middle of the night and have trouble getting back to sleep. Do not use any products that contain nicotine or tobacco. These products include cigarettes, chewing tobacco, and vaping devices, such as e-cigarettes. If you need help quitting, ask your health care provider. Medicines Take   over-the-counter and prescription medicines only as told by your health care provider. Do not use over-the-counter sleep medicine. You can become dependent on this medicine, and it can make sleep apnea worse. Do not use medicines, such as sedatives and narcotics, unless told by your health care provider. Activity Exercise on most days, but avoid exercising in the evening. Exercising near bedtime can interfere with  sleeping. If possible, spend time outside every day. Natural light helps regulate your circadian rhythm. General information Lose weight if you need to, and maintain a healthy weight. Keep all follow-up visits. This is important. If you are having surgery, make sure to tell your health care provider that you have sleep apnea. You may need to bring your device with you. Where to find more information Learn more about sleep apnea and daytime fatigue from: American Sleep Association: sleepassociation.org National Sleep Foundation: sleepfoundation.org National Heart, Lung, and Blood Institute: nhlbi.nih.gov Summary Sleep apnea is a condition in which breathing pauses or becomes shallow during sleep. Sleep apnea can cause daytime fatigue and other serious health conditions. You may need to wear a device while sleeping to help keep your airway open. If you are having surgery, make sure to tell your health care provider that you have sleep apnea. You may need to bring your device with you. Making changes to sleep habits, diet, lifestyle, and activity can help you manage sleep apnea. This information is not intended to replace advice given to you by your health care provider. Make sure you discuss any questions you have with your health care provider. Document Revised: 12/28/2020 Document Reviewed: 04/29/2020 Elsevier Patient Education  2023 Elsevier Inc.  

## 2022-06-29 ENCOUNTER — Encounter: Payer: Self-pay | Admitting: Cardiology

## 2022-06-29 DIAGNOSIS — E6609 Other obesity due to excess calories: Secondary | ICD-10-CM

## 2022-06-29 DIAGNOSIS — Z952 Presence of prosthetic heart valve: Secondary | ICD-10-CM

## 2022-06-29 DIAGNOSIS — I2510 Atherosclerotic heart disease of native coronary artery without angina pectoris: Secondary | ICD-10-CM

## 2022-07-02 NOTE — Telephone Encounter (Signed)
From patient.

## 2022-07-02 NOTE — Telephone Encounter (Signed)
ICD-10-CM   1. Coronary artery disease involving native coronary artery of native heart without angina pectoris  I25.10 Ambulatory referral to [facility]    2. S/P AVR (aortic valve replacement) and aortoplasty  Z95.2 Ambulatory referral to [facility]    3. Class 1 obesity due to excess calories with serious comorbidity and body mass index (BMI) of 31.0 to 31.9 in adult  E66.09 Ambulatory referral to [facility]   Z68.31      Orders Placed This Encounter  Procedures   Ambulatory referral to [facility]    Referral Priority:   Routine    Referral Type:   Consultation    Referral Reason:   Specialty Services Required    Number of Visits Requested:   1    No orders of the defined types were placed in this encounter.

## 2022-07-05 ENCOUNTER — Other Ambulatory Visit: Payer: Self-pay | Admitting: Urology

## 2022-07-05 ENCOUNTER — Encounter: Payer: Self-pay | Admitting: Neurology

## 2022-07-09 ENCOUNTER — Telehealth: Payer: Self-pay | Admitting: Neurology

## 2022-07-09 ENCOUNTER — Ambulatory Visit: Payer: Medicare Other | Admitting: Urology

## 2022-07-09 NOTE — Telephone Encounter (Signed)
Split- [employer] medicare no auth req.  Patient is scheduled at [facility] [place] for 07/10/22 at 8 pm.  He is aware to eat his even meal before arrive, can bring a snack if he would like, to bring comfortable clothes to sleep in and if he takes any night time medication to bring those with him.

## 2022-07-10 ENCOUNTER — Ambulatory Visit (INDEPENDENT_AMBULATORY_CARE_PROVIDER_SITE_OTHER): Payer: Medicare Other | Admitting: Neurology

## 2022-07-10 DIAGNOSIS — M314 Aortic arch syndrome [Takayasu]: Secondary | ICD-10-CM

## 2022-07-10 DIAGNOSIS — G4731 Primary central sleep apnea: Secondary | ICD-10-CM

## 2022-07-10 DIAGNOSIS — I77810 Thoracic aortic ectasia: Secondary | ICD-10-CM

## 2022-07-10 DIAGNOSIS — C61 Malignant neoplasm of prostate: Secondary | ICD-10-CM

## 2022-07-10 DIAGNOSIS — Z952 Presence of prosthetic heart valve: Secondary | ICD-10-CM

## 2022-07-10 DIAGNOSIS — M6281 Muscle weakness (generalized): Secondary | ICD-10-CM | POA: Diagnosis not present

## 2022-07-10 DIAGNOSIS — I7122 Aneurysm of the aortic arch, without rupture: Secondary | ICD-10-CM

## 2022-07-10 DIAGNOSIS — I2510 Atherosclerotic heart disease of native coronary artery without angina pectoris: Secondary | ICD-10-CM

## 2022-07-10 DIAGNOSIS — G4733 Obstructive sleep apnea (adult) (pediatric): Secondary | ICD-10-CM

## 2022-07-15 ENCOUNTER — Encounter: Payer: Self-pay | Admitting: Cardiology

## 2022-07-17 ENCOUNTER — Encounter: Payer: Self-pay | Admitting: Neurology

## 2022-07-17 DIAGNOSIS — M6281 Muscle weakness (generalized): Secondary | ICD-10-CM | POA: Diagnosis not present

## 2022-07-17 DIAGNOSIS — M62838 Other muscle spasm: Secondary | ICD-10-CM | POA: Diagnosis not present

## 2022-07-17 NOTE — Progress Notes (Signed)
Sleep study 07/10/2022: 1) Sleep disordered breathing was not present.    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 3) Total sleep time was reduced at 300.5 minutes.  Sleep efficiency was decreased at 62.2%.  This was unrelated to apnea.  RECOMMENDATIONS: There is no need for CPAP therapy at this level of apnea. The patient should avoid supine sleep position and avoid sedatives at bedtime. Larey Seat, MD

## 2022-07-17 NOTE — Progress Notes (Signed)
Good news for Devon Sanchez: there was such mild apnea present that CPAP is no longer needed.

## 2022-07-17 NOTE — Procedures (Signed)
[facility] POLYSOMNOGRAPHY  INTERPRETATION REPORT   STUDY DATE:  07/10/2022     PATIENT NAME:  Devon Sanchez         DATE OF BIRTH:  August 28, 1950  PATIENT ID:  [id number]    TYPE OF STUDY:  PSG  READING PHYSICIAN: Larey Seat, MD REFERRED BY: Dr Einar Gip, MD SCORING TECHNICIAN: Richard Miu, RPSGT   HISTORY:  This 72 year-old cardiac patient uses CPAP compliantly but has developed residual central apneas on download. CPAP was prescribed in 2017at a time when the patient had been dx with a thoracic ascending aortic aneurysm and bicuspid aortic valve (repaired/ replaced 2021) , GERD and recent MI. Here to see if PAP is still needed.  ADDITIONAL INFORMATION:  The Epworth Sleepiness Scale endorsed at 11 /24 points (scores above or equal to 10 are suggestive of hypersomnolence). FSS endorsed at  /63 points.  Height: 74 in Weight: 248 lbs (BMI 31) Neck Size: 17 in  MEDICATIONS: Tylenol, Aspirin, Coreg, Cozaar, Multivitamin, Fish Oil, Prilosec, MiraLAX, Crestor, Nitrostat, Lexapro  TECHNICAL DESCRIPTION: A registered sleep technologist (RPSGT)  was in attendance for the duration of the recording.  Data collection, scoring, video monitoring, and reporting were performed in compliance with the AASM Manual for the Scoring of Sleep and Associated Events; (Hypopnea is scored based on the criteria listed in Section VIII D. 1b in the AASM Manual V2.6 using a 4% oxygen desaturation rule or Hypopnea is scored based on the criteria listed in Section VIII D. 1a in the AASM Manual V2.6 using 3% oxygen desaturation and /or arousal rule).   SLEEP CONTINUITY AND SLEEP ARCHITECTURE:  Lights-out was at 20:41: and lights-on at  04:44:, with  8.1 hours of recording time . Total sleep time (TST) was 300.5 minutes with a decreased sleep efficiency at 62.2%.  Sleep latency was increased at 94.0 minutes.  REM sleep latency was increased at 215.5 minutes. Of the total sleep time, the  percentage of stage N1 sleep was 9.7%, stage N2 sleep was 73%, stage N3 sleep was 13.5%, and REM sleep was 3.7%. WASO time was 8 minutes. There was 1 Stage R period observed on this study night, 22 awakenings (i.e. transitions to Stage W from any sleep stage), and 77 total stage transitions.    BODY POSITION:  TST was divided between the following sleep positions: 20.6% supine; 79.4% lateral; 0% prone.  RESPIRATORY MONITORING:   Based on CMS criteria (using a 4% oxygen desaturation rule for scoring hypopneas), there was 1 apnea (0 obstructive; 0 central; 1 mixed), and 16 hypopneas.   The Apnea index was 0.2/h. Hypopnea index was 3.2/h. The AHI overall (apnea-hypopnea index was) 3.4 /hl (5.8 in supine, 5 in non-supine; 5.5 during REM). There were 0 respiratory effort-related arousals (RERAs).   OXIMETRY: Oxyhemoglobin Saturation Nadir during sleep was at  88%) from a mean of 94%.  Of the Total sleep time (TST) hypoxemia (<89%) was present for  0.1 minutes, or 0.0% of total sleep time.  LIMB MOVEMENTS: There were 130 periodic limb movements of sleep (26.0/hr), of which none  (0.0/hr) were associated with an arousal. AROUSAL: There were 70 arousals in total, for an arousal index of 14 arousals/hour.  Of these, 7 were identified as respiratory-related arousals (1 /h), 0 were PLM-related arousals (0 /h), and 74 were non-specific arousals (15 /h). EEG:  PSG EEG was of normal amplitude and frequency, with symmetric manifestation of sleep stages. EKG: The electrocardiogram documented irregular beats - ?  fibrillation.  The average heart rate during sleep was 66 bpm.  The heart rate during sleep varied between a minimum of 59  and  a maximum of  76 bpm. AUDIO and VIDEO: no abnormal sleep activity, vocalization or complex motor function captured.  IMPRESSION: 1) Sleep disordered breathing was not present.    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 3) Total sleep time was reduced at  300.5 minutes.  Sleep efficiency was decreased at 62.2%.  This was unrelated to apnea.  RECOMMENDATIONS: There is no need for CPAP therapy at this level of apnea. The patient should avoid supine sleep position and avoid sedatives at bedtime. Larey Seat, MD                Name: Devon Sanchez, Devon Sanchez  BMI: [id number] Physician: Larey Seat, MD  ID: [id number] Height: 74.0 in Technician: Richard Miu, RPSGT  Sex: Male Weight: 248.0 lb Record: [id number]  Age: 34 [September 30, 1950] Date: 07/10/2022    Medical & Medication History    Devon Sanchez is a 72 y.o. year old White or Caucasian male patient seen here as a referral on 06/27/2022 from Dr Einar Gip for a new sleep evaluation . Chief concern according to patient : " I had open heart surgery, Aortic Valve replacement in 05-2020, and I still use my cpap" . I have the pleasure of seeing Devon Sanchez , who has a has a past medical history of Anxiety, Aortic root dilatation (HCC), Bicuspid aortic valve (07/25/2018), Coronary artery disease, Depression, Erectile dysfunction, Hypercholesterolemia, Hyperglycemia, Hyperlipidemia, MI (myocardial infarction) ([place]), Reflux, Sleep apnea, and Thoracic ascending aortic aneurysm ([place]). The patient had the last sleep study in January of the year 2017 -the patient has a history of thoracic ascending aortic aneurysm, sleep apnea, history of gastric reflux myocardial infarction, hyperglycemia, hyperlipidemia, a bicuspid aortic valve was replaced in December 2021 he also had aortic root dilation that needed to be corrected. He is was recently seen by Dr. Einar Gip and according to Dr. Einar Gip the patient is doing very well he also feels less fatigued and physically more refreshed. Dr. Einar Gip suggested to repeat his sleep evaluation. I had seen the patient last on 12-06-2016 at the time he had endorsed 100% compliance with his CPAP use 95th percentile pressure was 10 cm water residual AHI at the time was 1.3/h. He had lost 45  pounds at the time in comparison to the time of the sleep study. He reported to have very vivid dreams sometimes emotionally taxing dreams and he did sometimes struggle with daytime sleepiness but not to narcoleptic levels. He had worked as a Associate Professor. The fatigue severity scale in 2018 was endorsed at 26 points on the Epworth Sleepiness Scale at 9 points. He still uses his CPAP from 2017. He is also a very compliant user he has used the machine for the last 30 days 100% and on 25 of those days for over 4 hours consecutively the average user time is 5 hours 47 minutes. The pressure ranges between 7 and 10 cm water with 3 cm PVR. The residual AHI currently is 3.8 95th percentile pressure 9.9 cm water and there are most centrals and obstructive's among his residual apneas. The air leak is low. So in order to replace his machine but also to pay attention to the medical changes of the meantime I would like to repeat his sleep study. In 2017 he underwent a CPAP titration polysomnography following a nocturnal  PSG. He did best there at 9 cm water pressure and he had REM rebound interestingly REM sleep was not associated with any apnea which tells me that there were complex and central apneas present.  Tylenol, Aspirin, Coreg, Cozaar, Multivitamin, Fish Oil, Prilosec, MiraLAX, Crestor, Nitrostat, Lexapro   Sleep Disorder      Comments   The patient came into the sleep lab for a Split study. The patient was not split due to not having an AHI of 10 or more per MD's order. He had prior sleep studies with our lab in 2017. One restroom trip. EKG irregular. Patient already established with a Cardiologist. Mild snoring noted. PLM's witnessed. All respiratory events scored with a 4% desat. The patient slept and lateral. Short REM period. The patient had some periods of WASO.     Lights out: 08:41:46 PM Lights on: 04:44:38 AM   Time Total Supine Side Prone Upright  Recording (TRT) 8h 3.672m3h 6.5672mh 56.72m46m 0.56m 66m 0.56m  34mep (TST) 5h 0.72m 1h49m56m 3h 67m72m 0h 076m 0h 0.48m  Late36m N1 N2 N3 REM Onset Per. Slp. Eff.  Actual 1h 34.56m 1h 42.048mh 58.56m39m 35.72m 16m34.56m 153m5.56m 6236m%   Stg71mr Wake N1 N2 N3 REM  Total 72.5 29.0 220.0 40.5 11.0  Supine 36.0 9.5 35.5 17.0 0.0  Side 36.5 19.5 184.5 23.5 11.0  Prone 0.0 0.0 0.0 0.0 0.0  Upright 0.0 0.0 0.0 0.0 0.0   Stg % Wake N1 N2 N3 REM  Total 19.4 9.7 73.2 13.5 3.7  Supine 9.7 3.2 11.8 5.7 0.0  Side 9.8 6.5 61.4 7.8 3.7  Prone 0.0 0.0 0.0 0.0 0.0  Upright 0.0 0.0 0.0 0.0 0.0     Apnea Summary Sub Supine Side Prone Upright  Total 1 Total 1 0 1 0 0    REM 0 0 0 0 0    NREM 1 0 1 0 0  Obs 0 REM 0 0 0 0 0    NREM 0 0 0 0 0  Mix 1 REM 0 0 0 0 0    NREM 1 0 1 0 0  Cen 0 REM 0 0 0 0 0    NREM 0 0 0 0 0   Rera Summary Sub Supine Side Prone Upright  Total 0 Total 0 0 0 0 0    REM 0 0 0 0 0    NREM 0 0 0 0 0   Hypopnea Summary Sub Supine Side Prone Upright  Total 16 Total 16 6 10 $ 0 0    REM 1 0 1 0 0    NREM 15 6 9 $ 0 0   4% Hypopnea Summary Sub Supine Side Prone Upright  Total (4%) 16 Total 16 6 10 $ 0 0    REM 1 0 1 0 0    NREM 15 6 9 $ 0 0     AHI Total Obs Mix Cen  3.39 Apnea 0.20 0.00 0.20 0.00   Hypopnea 3.19 -- -- --  3.39 Hypopnea (4%) 3.19 -- -- --    Total Supine Side Prone Upright  Position AHI 3.39 5.81 2.77 0.00 0.00  REM AHI 5.45   NREM AHI 3.32   Position RDI 3.39 5.81 2.77 0.00 0.00  REM RDI 5.45   NREM RDI 3.32    4% Hypopnea Total Supine Side Prone Upright  Position AHI (4%) 3.39 5.81 2.77 0.00 0.00  REM AHI (4%) 5.45  NREM AHI (4%) 3.32   Position RDI (4%) 3.39 5.81 2.77 0.00 0.00  REM RDI (4%) 5.45   NREM RDI (4%) 3.32    Desaturation Information Threshold: 2% <100% <90% <80% <70% <60% <50% <40%  Supine 50.0 0.0 0.0 0.0 0.0 0.0 0.0  Side 105.0 3.0 0.0 0.0 0.0 0.0 0.0  Prone 0.0 0.0 0.0 0.0 0.0 0.0 0.0  Upright 0.0 0.0 0.0 0.0 0.0 0.0 0.0  Total 155.0 3.0 0.0 0.0 0.0 0.0 0.0  Index 24.9 0.5 0.0  0.0 0.0 0.0 0.0   Threshold: 3% <100% <90% <80% <70% <60% <50% <40%  Supine 11.0 0.0 0.0 0.0 0.0 0.0 0.0  Side 14.0 3.0 0.0 0.0 0.0 0.0 0.0  Prone 0.0 0.0 0.0 0.0 0.0 0.0 0.0  Upright 0.0 0.0 0.0 0.0 0.0 0.0 0.0  Total 25.0 3.0 0.0 0.0 0.0 0.0 0.0  Index 4.0 0.5 0.0 0.0 0.0 0.0 0.0   Threshold: 4% <100% <90% <80% <70% <60% <50% <40%  Supine 6.0 0.0 0.0 0.0 0.0 0.0 0.0  Side 12.0 3.0 0.0 0.0 0.0 0.0 0.0  Prone 0.0 0.0 0.0 0.0 0.0 0.0 0.0  Upright 0.0 0.0 0.0 0.0 0.0 0.0 0.0  Total 18.0 3.0 0.0 0.0 0.0 0.0 0.0  Index 2.9 0.5 0.0 0.0 0.0 0.0 0.0   Threshold: 4% <100% <90% <80% <70% <60% <50% <40%  Supine 6 0 0 0 0 0 0  Side 12 3 0 0 0 0 0  Prone 0 0 0 0 0 0 0  Upright 0 0 0 0 0 0 0  Total 18 3 0 0 0 0 0   Awakening/Arousal Information # of Awakenings 22  Wake after sleep onset 88.20m Wake after persistent sleep 86.058m Arousal Assoc. Arousals Index  Apneas 0 0.0  Hypopneas 7 1.4  Leg Movements 0 0.0  Snore 0 0.0  PTT Arousals 0 0.0  Spontaneous 74 14.8  Total 81 16.2  Leg Movement Information PLMS LMs Index  Total LMs during PLMS 130 26.0  LMs w/ Microarousals 0 0.0   LM LMs Index  w/ Microarousal 0 0.0  w/ Awakening 0 0.0  w/ Resp Event 0 0.0  Spontaneous 38 7.6  Total 38 7.6     Desaturation threshold setting: 4% Minimum desaturation setting: 10 seconds SaO2 nadir: 88% The longest event was a 37 sec obstructive Hypopnea with a minimum SaO2 of 89%. The lowest SaO2 was 88% associated with a 25 sec obstructive Hypopnea. EKG Rates EKG Avg Max Min  Awake 67 79 61  Asleep 66 76 59

## 2022-07-26 NOTE — Progress Notes (Addendum)
COVID Vaccine Completed: yes  Date of COVID positive in last 90 days: no  PCP - Allyn Kenner, MD Cardiologist - Adrian Prows, MD  Cardiac clearance by Adrian Prows 07/15/22 on chart  Chest x-ray - n/a EKG - 04/02/22 Epic Stress Test - 10/19/13 [facility] ECHO - 06/29/21 Epic Cardiac Cath - 11/10/19 Epic Pacemaker/ICD device last checked: n/a Spinal Cord Stimulator: n/a  Bowel Prep - fleet enema and magnesium citrate, patient has instructions  Sleep Study - yes, positive CPAP - some nights  Fasting Blood Sugar - pre DM Checks Blood Sugar no checks at home  Last dose of GLP1 agonist-  N/A GLP1 instructions:  N/A   Last dose of SGLT-2 inhibitors-  N/A SGLT-2 instructions: N/A   Blood Thinner Instructions: Aspirin Instructions: ASA 81, pt was instructed to continue  Last Dose:  Activity level: Can go up a flight of stairs and perform activities of daily living without stopping and without symptoms of chest pain or shortness of breath.   Anesthesia review: NSTEMI, atherosclerosis, OSA, endocarditis, CAD, aortic valve replacement   Patient denies shortness of breath, fever, cough and chest pain at PAT appointment  Patient verbalized understanding of instructions that were given to them at the PAT appointment. Patient was also instructed that they will need to review over the PAT instructions again at home before surgery.

## 2022-07-26 NOTE — Patient Instructions (Addendum)
SURGICAL WAITING ROOM VISITATION  Patients having surgery or a procedure may have no more than 2 support people in the waiting area - these visitors may rotate.    Children under the age of 19 must have an adult with them who is not the patient.  Due to an increase in RSV and influenza rates and associated hospitalizations, children ages 20 and under may not visit patients in [place].  If the patient needs to stay at the hospital during part of their recovery, the visitor guidelines for inpatient rooms apply. Pre-op nurse will coordinate an appropriate time for 1 support person to accompany patient in pre-op.  This support person may not rotate.    Please refer to the [facility] website for the visitor guidelines for Inpatients (after your surgery is over and you are in a regular room).    Your procedure is scheduled on: 08/13/22   Report to [facility] Main Entrance    Report to admitting at 5:15 AM   Call this number if you have problems the morning of surgery [phone]   Follow  a clear liquid diet the day before surgery.   Water Non-Citrus Juices (without pulp, NO RED-Apple, White grape, White cranberry) Black Coffee (NO MILK/CREAM OR CREAMERS, sugar ok)  Clear Tea (NO MILK/CREAM OR CREAMERS, sugar ok) regular and decaf                             Plain Jell-O (NO RED)                                           Fruit ices (not with fruit pulp, NO RED)                                     Popsicles (NO RED)                                                               Sports drinks like Gatorade (NO RED)  Do not drink anything after midnight the night before your surgery                      If you have questions, please contact your surgeon's office.   FOLLOW BOWEL PREP AND ANY ADDITIONAL PRE OP INSTRUCTIONS YOU RECEIVED FROM YOUR SURGEON'S OFFICE!!!     Oral Hygiene is also important to reduce your risk of infection.                                     Remember - BRUSH YOUR TEETH THE MORNING OF SURGERY WITH YOUR REGULAR TOOTHPASTE  DENTURES WILL BE REMOVED PRIOR TO SURGERY PLEASE DO NOT APPLY "Poly grip" OR ADHESIVES!!!   Take these medicines the morning of surgery with A SIP OF WATER: Tylenol, Carvedilol, Escitalopram, Omeprazole, Rosuvastatin   Bring CPAP mask and tubing day of surgery.  You may not have any metal on your body including jewelry, and body piercing             Do not wear lotions, powders, cologne, or deodorant  Do not shave  48 hours prior to surgery.               Men may shave face and neck.   Do not bring valuables to the hospital. [place].   Contacts, glasses, dentures or bridgework may not be worn into surgery.   Bring small overnight bag day of surgery.   DO NOT [place]. PHARMACY WILL DISPENSE MEDICATIONS LISTED ON YOUR MEDICATION LIST TO YOU DURING YOUR ADMISSION [place]!              Please read over the following fact sheets you were given: IF [place] [phone] Schneiders   If you received a COVID test during your pre-op visit  it is requested that you wear a mask when out in public, stay away from anyone that may not be feeling well and notify your surgeon if you develop symptoms. If you test positive for Covid or have been in contact with anyone that has tested positive in the last 10 days please notify you surgeon.    [place] - Preparing for Surgery Before surgery, you can play an important role.  Because skin is not sterile, your skin needs to be as free of germs as possible.  You can reduce the number of germs on your skin by washing with CHG (chlorahexidine gluconate) soap before surgery.  CHG is an antiseptic cleaner which kills germs and bonds with the skin to continue killing germs even after washing. Please DO NOT  use if you have an allergy to CHG or antibacterial soaps.  If your skin becomes reddened/irritated stop using the CHG and inform your nurse when you arrive at Short Stay. Do not shave (including legs and underarms) for at least 48 hours prior to the first CHG shower.  You may shave your face/neck.  Please follow these instructions carefully:  1.  Shower with CHG Soap the night before surgery and the  morning of surgery.  2.  If you choose to wash your hair, wash your hair first as usual with your normal  shampoo.  3.  After you shampoo, rinse your hair and body thoroughly to remove the shampoo.                             4.  Use CHG as you would any other liquid soap.  You can apply chg directly to the skin and wash.  Gently with a scrungie or clean washcloth.  5.  Apply the CHG Soap to your body ONLY FROM THE NECK DOWN.   Do   not use on face/ open                           Wound or open sores. Avoid contact with eyes, ears mouth and   genitals (private parts).                       Wash face,  Genitals (private parts) with your normal soap.  6.  Wash thoroughly, paying special attention to the area where your    surgery  will be performed.  7.  Thoroughly rinse your body with warm water from the neck down.  8.  DO NOT shower/wash with your normal soap after using and rinsing off the CHG Soap.                9.  Pat yourself dry with a clean towel.            10.  Wear clean pajamas.            11.  Place clean sheets on your bed the night of your first shower and do not  sleep with pets. Day of Surgery : Do not apply any lotions/deodorants the morning of surgery.  Please wear clean clothes to the hospital/surgery center.  FAILURE TO FOLLOW THESE INSTRUCTIONS MAY RESULT IN THE CANCELLATION OF YOUR SURGERY  PATIENT SIGNATURE_________________________________  NURSE  SIGNATURE__________________________________  ________________________________________________________________________   WHAT IS A BLOOD TRANSFUSION? Blood Transfusion Information  A transfusion is the replacement of blood or some of its parts. Blood is made up of multiple cells which provide different functions. Red blood cells carry oxygen and are used for blood loss replacement. White blood cells fight against infection. Platelets control bleeding. Plasma helps clot blood. Other blood products are available for specialized needs, such as hemophilia or other clotting disorders. BEFORE THE TRANSFUSION  Who gives blood for transfusions?  Healthy volunteers who are fully evaluated to make sure their blood is safe. This is blood bank blood. Transfusion therapy is the safest it has ever been in the practice of medicine. Before blood is taken from a donor, a complete history is taken to make sure that person has no history of diseases nor engages in risky social behavior (examples are intravenous drug use or sexual activity with multiple partners). The donor's travel history is screened to minimize risk of transmitting infections, such as malaria. The donated blood is tested for signs of infectious diseases, such as HIV and hepatitis. The blood is then tested to be sure it is compatible with you in order to minimize the chance of a transfusion reaction. If you or a relative donates blood, this is often done in anticipation of surgery and is not appropriate for emergency situations. It takes many days to process the donated blood. RISKS AND COMPLICATIONS Although transfusion therapy is very safe and saves many lives, the main dangers of transfusion include:  Getting an infectious disease. Developing a transfusion reaction. This is an allergic reaction to something in the blood you were given. Every precaution is taken to prevent this. The decision to have a blood transfusion has been considered  carefully by your caregiver before blood is given. Blood is not given unless the benefits outweigh the risks. AFTER THE TRANSFUSION Right after receiving a blood transfusion, you will usually feel much better and more energetic. This is especially true if your red blood cells have gotten low (anemic). The transfusion raises the level of the red blood cells which carry oxygen, and this usually causes an energy increase. The nurse administering the transfusion will monitor you carefully for complications. HOME CARE INSTRUCTIONS  No special instructions are needed after a transfusion. You may find your energy is better. Speak with your caregiver about any limitations on activity for underlying diseases you may have. SEEK MEDICAL CARE IF:  Your condition is not improving after your transfusion. You develop redness or irritation at  the intravenous (IV) site. SEEK IMMEDIATE MEDICAL CARE IF:  Any of the following symptoms occur over the next 12 hours: Shaking chills. You have a temperature by mouth above 102 F (38.9 C), not controlled by medicine. Chest, back, or muscle pain. People around you feel you are not acting correctly or are confused. Shortness of breath or difficulty breathing. Dizziness and fainting. You get a rash or develop hives. You have a decrease in urine output. Your urine turns a dark color or changes to pink, red, or brown. Any of the following symptoms occur over the next 10 days: You have a temperature by mouth above 102 F (38.9 C), not controlled by medicine. Shortness of breath. Weakness after normal activity. The white part of the eye turns yellow (jaundice). You have a decrease in the amount of urine or are urinating less often. Your urine turns a dark color or changes to pink, red, or brown. Document Released: 05/18/2000 Document Revised: 08/13/2011 Document Reviewed: 01/05/2008 [facility] Patient Information 2014 [place],  [place].  _______________________________________________________________________

## 2022-07-30 ENCOUNTER — Encounter (HOSPITAL_COMMUNITY)
Admission: RE | Admit: 2022-07-30 | Discharge: 2022-07-30 | Disposition: A | Discharge status: Home / Self Care | Payer: Medicare Other | Source: Ambulatory Visit | Attending: Urology | Admitting: Urology

## 2022-07-30 ENCOUNTER — Encounter (HOSPITAL_COMMUNITY): Payer: Self-pay

## 2022-07-30 DIAGNOSIS — Z01812 Encounter for preprocedural laboratory examination: Secondary | ICD-10-CM | POA: Insufficient documentation

## 2022-07-30 HISTORY — DX: Personal history of other diseases of the digestive system: Z87.19

## 2022-07-30 HISTORY — DX: Malignant (primary) neoplasm, unspecified: C80.1

## 2022-07-30 NOTE — Progress Notes (Signed)
Called patient x3 regarding his PAT appointment scheduled 07/30/22 at 1100. No answer. Left voicemail with call back number. Also called patient's wife with no answer.

## 2022-08-07 ENCOUNTER — Encounter (HOSPITAL_COMMUNITY)
Admission: RE | Admit: 2022-08-07 | Discharge: 2022-08-07 | Disposition: A | Discharge status: Home / Self Care | Payer: Medicare Other | Source: Ambulatory Visit | Attending: Urology | Admitting: Urology

## 2022-08-07 VITALS — BP 119/87 | HR 65 | Temp 97.9°F | Resp 16 | Ht 74.0 in | Wt 247.0 lb

## 2022-08-07 DIAGNOSIS — I2510 Atherosclerotic heart disease of native coronary artery without angina pectoris: Secondary | ICD-10-CM | POA: Diagnosis not present

## 2022-08-07 DIAGNOSIS — C61 Malignant neoplasm of prostate: Secondary | ICD-10-CM | POA: Insufficient documentation

## 2022-08-07 DIAGNOSIS — G4730 Sleep apnea, unspecified: Secondary | ICD-10-CM | POA: Diagnosis not present

## 2022-08-07 DIAGNOSIS — M62838 Other muscle spasm: Secondary | ICD-10-CM | POA: Diagnosis not present

## 2022-08-07 DIAGNOSIS — Z01812 Encounter for preprocedural laboratory examination: Secondary | ICD-10-CM | POA: Diagnosis not present

## 2022-08-07 DIAGNOSIS — R7303 Prediabetes: Secondary | ICD-10-CM

## 2022-08-07 DIAGNOSIS — Z952 Presence of prosthetic heart valve: Secondary | ICD-10-CM | POA: Diagnosis not present

## 2022-08-07 DIAGNOSIS — Z955 Presence of coronary angioplasty implant and graft: Secondary | ICD-10-CM | POA: Diagnosis not present

## 2022-08-07 DIAGNOSIS — M6281 Muscle weakness (generalized): Secondary | ICD-10-CM | POA: Diagnosis not present

## 2022-08-07 LAB — BASIC METABOLIC PANEL
Anion gap: 9 (ref 5–15)
BUN: 28 mg/dL — ABNORMAL HIGH (ref 8–23)
CO2: 24 mmol/L (ref 22–32)
Calcium: 9.1 mg/dL (ref 8.9–10.3)
Chloride: 105 mmol/L (ref 98–111)
Creatinine, Ser: 1.07 mg/dL (ref 0.61–1.24)
GFR, Estimated: >60 mL/min (ref >60)
Glucose, Bld: 108 mg/dL — ABNORMAL HIGH (ref 70–99)
Potassium: 4.3 mmol/L (ref 3.5–5.1)
Sodium: 138 mmol/L (ref 135–145)

## 2022-08-07 LAB — CBC
HCT: 41.8 % (ref 39.0–52.0)
Hemoglobin: 14 g/dL (ref 13.0–17.0)
MCH: 31.5 pg (ref 26.0–34.0)
MCHC: 33.5 g/dL (ref 30.0–36.0)
MCV: 94.1 fL (ref 80.0–100.0)
Platelets: 185 10*3/uL (ref 150–400)
RBC: 4.44 MIL/uL (ref 4.22–5.81)
RDW: 13.8 % (ref 11.5–15.5)
WBC: 4.6 10*3/uL (ref 4.0–10.5)
nRBC: 0 % (ref 0.0–0.2)

## 2022-08-07 NOTE — Patient Instructions (Addendum)
SURGICAL WAITING ROOM VISITATION  Patients having surgery or a procedure may have no more than 2 support people in the waiting area - these visitors may rotate.    Children under the age of 47 must have an adult with them who is not the patient.  Due to an increase in RSV and influenza rates and associated hospitalizations, children ages 40 and under may not visit patients in [place].  If the patient needs to stay at the hospital during part of their recovery, the visitor guidelines for inpatient rooms apply. Pre-op nurse will coordinate an appropriate time for 1 support person to accompany patient in pre-op.  This support person may not rotate.    Please refer to the [facility] website for the visitor guidelines for Inpatients (after your surgery is over and you are in a regular room).    Your procedure is scheduled on: 08/13/22   Report to [facility] Main Entrance    Report to admitting at 5:15 AM   Call this number if you have problems the morning of surgery [phone]   Follow a clear liquid diet the day before surgery.   Nothing to drink after midnight the night before surgery          If you have questions, please contact your surgeon's office.   FOLLOW BOWEL PREP AND ANY ADDITIONAL PRE OP INSTRUCTIONS YOU RECEIVED FROM YOUR SURGEON'S OFFICE!!!     Oral Hygiene is also important to reduce your risk of infection.                                    Remember - BRUSH YOUR TEETH THE MORNING OF SURGERY WITH YOUR REGULAR TOOTHPASTE  DENTURES WILL BE REMOVED PRIOR TO SURGERY PLEASE DO NOT APPLY "Poly grip" OR ADHESIVES!!!   Do NOT smoke after Midnight   Take these medicines the morning of surgery with A SIP OF WATER: Tylenol, Carvedilol, Escitalopram, Omeprazole, Rosuvastatin   DO NOT TAKE ANY ORAL DIABETIC MEDICATIONS DAY OF YOUR SURGERY  How to Manage Your Diabetes Before and After Surgery  Why is it important to control my blood sugar before and  after surgery? Improving blood sugar levels before and after surgery helps healing and can limit problems. A way of improving blood sugar control is eating a healthy diet by:  Eating less sugar and carbohydrates  Increasing activity/exercise  Talking with your doctor about reaching your blood sugar goals High blood sugars (greater than 180 mg/dL) can raise your risk of infections and slow your recovery, so you will need to focus on controlling your diabetes during the weeks before surgery. Make sure that the doctor who takes care of your diabetes knows about your planned surgery including the date and location.  How do I manage my blood sugar before surgery? Check your blood sugar at least 4 times a day, starting 2 days before surgery, to make sure that the level is not too high or low. Check your blood sugar the morning of your surgery when you wake up and every 2 hours until you get to the Short Stay unit. If your blood sugar is less than 70 mg/dL, you will need to treat for low blood sugar: Do not take insulin. Treat a low blood sugar (less than 70 mg/dL) with  cup of clear juice (cranberry or apple), 4 glucose tablets, OR glucose gel. Recheck blood sugar in 15  minutes after treatment (to make sure it is greater than 70 mg/dL). If your blood sugar is not greater than 70 mg/dL on recheck, call [phone] for further instructions. Report your blood sugar to the short stay nurse when you get to Short Stay.  If you are admitted to the hospital after surgery: Your blood sugar will be checked by the staff and you will probably be given insulin after surgery (instead of oral diabetes medicines) to make sure you have good blood sugar levels. The goal for blood sugar control after surgery is 80-180 mg/dL.  Reviewed and Endorsed by [facility] Patient Education Committee, August 2015                              You may not have any metal on your body including hair pins, jewelry, and body  piercing             Do not wear make-up, lotions, powders, perfumes/cologne, or deodorant  Do not shave  48 hours prior to surgery.               Men may shave face and neck.   Do not bring valuables to the hospital. [place].   Contacts, glasses, dentures or bridgework may not be worn into surgery.   Bring small overnight bag day of surgery.   DO NOT [place]. PHARMACY WILL DISPENSE MEDICATIONS LISTED ON YOUR MEDICATION LIST TO YOU DURING YOUR ADMISSION [place]!   Special Instructions: Bring a copy of your healthcare power of attorney and living will documents the day of surgery if you haven't scanned them before.              Please read over the following fact sheets you were given: IF [place] [phone] Schneiders    If you received a COVID test during your pre-op visit  it is requested that you wear a mask when out in public, stay away from anyone that may not be feeling well and notify your surgeon if you develop symptoms. If you test positive for Covid or have been in contact with anyone that has tested positive in the last 10 days please notify you surgeon.    [place] - Preparing for Surgery Before surgery, you can play an important role.  Because skin is not sterile, your skin needs to be as free of germs as possible.  You can reduce the number of germs on your skin by washing with CHG (chlorahexidine gluconate) soap before surgery.  CHG is an antiseptic cleaner which kills germs and bonds with the skin to continue killing germs even after washing. Please DO NOT use if you have an allergy to CHG or antibacterial soaps.  If your skin becomes reddened/irritated stop using the CHG and inform your nurse when you arrive at Short Stay. Do not shave (including legs and underarms) for at least 48 hours prior to the first CHG shower.  You  may shave your face/neck.  Please follow these instructions carefully:  1.  Shower with CHG Soap the night before surgery and the  morning of surgery.  2.  If you choose to wash your hair, wash your hair first as usual with your normal  shampoo.  3.  After  you shampoo, rinse your hair and body thoroughly to remove the shampoo.                             4.  Use CHG as you would any other liquid soap.  You can apply chg directly to the skin and wash.  Gently with a scrungie or clean washcloth.  5.  Apply the CHG Soap to your body ONLY FROM THE NECK DOWN.   Do   not use on face/ open                           Wound or open sores. Avoid contact with eyes, ears mouth and   genitals (private parts).                       Wash face,  Genitals (private parts) with your normal soap.             6.  Wash thoroughly, paying special attention to the area where your    surgery  will be performed.  7.  Thoroughly rinse your body with warm water from the neck down.  8.  DO NOT shower/wash with your normal soap after using and rinsing off the CHG Soap.                9.  Pat yourself dry with a clean towel.            10.  Wear clean pajamas.            11.  Place clean sheets on your bed the night of your first shower and do not  sleep with pets. Day of Surgery : Do not apply any lotions/deodorants the morning of surgery.  Please wear clean clothes to the hospital/surgery center.  FAILURE TO FOLLOW THESE INSTRUCTIONS MAY RESULT IN THE CANCELLATION OF YOUR SURGERY  PATIENT SIGNATURE_________________________________  NURSE SIGNATURE__________________________________  ________________________________________________________________________ WHAT IS A BLOOD TRANSFUSION? Blood Transfusion Information  A transfusion is the replacement of blood or some of its parts. Blood is made up of multiple cells which provide different functions. Red blood cells carry oxygen and are used for blood loss  replacement. White blood cells fight against infection. Platelets control bleeding. Plasma helps clot blood. Other blood products are available for specialized needs, such as hemophilia or other clotting disorders. BEFORE THE TRANSFUSION  Who gives blood for transfusions?  Healthy volunteers who are fully evaluated to make sure their blood is safe. This is blood bank blood. Transfusion therapy is the safest it has ever been in the practice of medicine. Before blood is taken from a donor, a complete history is taken to make sure that person has no history of diseases nor engages in risky social behavior (examples are intravenous drug use or sexual activity with multiple partners). The donor's travel history is screened to minimize risk of transmitting infections, such as malaria. The donated blood is tested for signs of infectious diseases, such as HIV and hepatitis. The blood is then tested to be sure it is compatible with you in order to minimize the chance of a transfusion reaction. If you or a relative donates blood, this is often done in anticipation of surgery and is not appropriate for emergency situations. It takes many days to process the donated blood. RISKS AND COMPLICATIONS Although transfusion therapy is very safe and saves many lives,  the main dangers of transfusion include:  Getting an infectious disease. Developing a transfusion reaction. This is an allergic reaction to something in the blood you were given. Every precaution is taken to prevent this. The decision to have a blood transfusion has been considered carefully by your caregiver before blood is given. Blood is not given unless the benefits outweigh the risks. AFTER THE TRANSFUSION Right after receiving a blood transfusion, you will usually feel much better and more energetic. This is especially true if your red blood cells have gotten low (anemic). The transfusion raises the level of the red blood cells which carry oxygen, and  this usually causes an energy increase. The nurse administering the transfusion will monitor you carefully for complications. HOME CARE INSTRUCTIONS  No special instructions are needed after a transfusion. You may find your energy is better. Speak with your caregiver about any limitations on activity for underlying diseases you may have. SEEK MEDICAL CARE IF:  Your condition is not improving after your transfusion. You develop redness or irritation at the intravenous (IV) site. SEEK IMMEDIATE MEDICAL CARE IF:  Any of the following symptoms occur over the next 12 hours: Shaking chills. You have a temperature by mouth above 102 F (38.9 C), not controlled by medicine. Chest, back, or muscle pain. People around you feel you are not acting correctly or are confused. Shortness of breath or difficulty breathing. Dizziness and fainting. You get a rash or develop hives. You have a decrease in urine output. Your urine turns a dark color or changes to pink, red, or brown. Any of the following symptoms occur over the next 10 days: You have a temperature by mouth above 102 F (38.9 C), not controlled by medicine. Shortness of breath. Weakness after normal activity. The white part of the eye turns yellow (jaundice). You have a decrease in the amount of urine or are urinating less often. Your urine turns a dark color or changes to pink, red, or brown. Document Released: 05/18/2000 Document Revised: 08/13/2011 Document Reviewed: 01/05/2008 [facility] Patient Information 2014 [place], [place].  _______________________________________________________________________

## 2022-08-08 LAB — HEMOGLOBIN A1C
Hgb A1c MFr Bld: 6.1 % — ABNORMAL HIGH (ref 4.8–5.6)
Mean Plasma Glucose: 128 mg/dL

## 2022-08-08 NOTE — Anesthesia Preprocedure Evaluation (Addendum)
Anesthesia Evaluation  Patient identified by MRN, date of birth, ID band Patient awake    Reviewed: Allergy & Precautions, H&P , NPO status , Patient's Chart, lab work & pertinent test results  Airway Mallampati: II  TM Distance: >3 FB Neck ROM: Full    Dental no notable dental hx.    Pulmonary sleep apnea and Continuous Positive Airway Pressure Ventilation    Pulmonary exam normal breath sounds clear to auscultation       Cardiovascular + CAD, + Past MI and + Cardiac Stents   Rhythm:Regular Rate:Normal + Systolic murmurs    Neuro/Psych negative neurological ROS  negative psych ROS   GI/Hepatic negative GI ROS, Neg liver ROS,,,  Endo/Other  negative endocrine ROS    Renal/GU negative Renal ROS  negative genitourinary   Musculoskeletal negative musculoskeletal ROS (+)    Abdominal   Peds negative pediatric ROS (+)  Hematology negative hematology ROS (+)   Anesthesia Other Findings   Reproductive/Obstetrics negative OB ROS                             Anesthesia Physical Anesthesia Plan  ASA: 3  Anesthesia Plan: General   Post-op Pain Management: Dilaudid IV   Induction: Intravenous  PONV Risk Score and Plan: 2 and Ondansetron, Dexamethasone and Treatment may vary due to age or medical condition  Airway Management Planned: Oral ETT  Additional Equipment:   Intra-op Plan:   Post-operative Plan: Extubation in OR  Informed Consent: I have reviewed the patients History and Physical, chart, labs and discussed the procedure including the risks, benefits and alternatives for the proposed anesthesia with the patient or authorized representative who has indicated his/her understanding and acceptance.     Dental advisory given  Plan Discussed with: CRNA and Surgeon  Anesthesia Plan Comments: (See PAT note 08/07/2022)       Anesthesia Quick Evaluation

## 2022-08-08 NOTE — Progress Notes (Signed)
Anesthesia Chart Review   Case: [id number] Date/Time: 08/13/22 0700   Procedures:      XI ROBOTIC ASSISTED LAPAROSCOPIC RADICAL PROSTATECTOMY LEVEL 2 - 210 MINUTES NEEDED FOR CASE     BILATERAL PELVIC LYMPHADENECTOMY (Bilateral)   Anesthesia type: General   Pre-op diagnosis: PROSTATE CANCER   Location: [place] [facility] / [facility]   Surgeons: Raynelle Bring, MD       DISCUSSION:72 y.o. never smoker with h/o sleep apnea, CAD S/P Prox LAD stent in 2007 and again on 04/20/2013 underwent stenting to the proximal and mid LAD and proximal and mid circumflex, Bentall procedure with bovine prosthetic aortic valve replacement and aortic root repair on 05/12/2020, prostate cancer scheduled for above procedure 08/13/2022 with Dr. Raynelle Bring.   Pt last seen by cardiology 04/02/2022. Stable at this visit.  Per cardiology note 07/15/22, "MANVEER SORLIE is at low risk, from a cardiac standpoint, for his upcoming procedure: Laparoscopic radical prostatectomy.  It is ok to proceed without further cardiac testing.   If applicable can hold ASA for 7  day(s) prior to procedure and re-start 2-5  days post procedure."  Anticipate pt can proceed with planned procedure barring acute status change.   VS: BP 119/87 (BP Location: Right Arm)   Pulse 65   Temp 36.6 C (Oral)   Resp 16   Ht '6\' 2"'$  (1.88 m)   Wt 112 kg   SpO2 96%   BMI 31.71 kg/m   PROVIDERS: Celene Squibb, MD is PCP   Cardiologist - Adrian Prows, MD  LABS: Labs reviewed: Acceptable for surgery. (all labs ordered are listed, but only abnormal results are displayed)  Labs Reviewed  BASIC METABOLIC PANEL - Abnormal; Notable for the following components:      Result Value   Glucose, Bld 108 (*)    BUN 28 (*)    All other components within normal limits  HEMOGLOBIN A1C - Abnormal; Notable for the following components:   Hgb A1c MFr Bld 6.1 (*)    All other components within normal limits  CBC  TYPE AND SCREEN      IMAGES:   EKG:   CV: Echocardiogram 06/29/2021:  Normal LV systolic function with visual EF 60-65%. Left ventricle cavity  is normal in size. Normal left ventricular wall thickness. Normal global  wall motion. Normal diastolic filling pattern, normal LAP.  S/P Bentall procedure  Bioprosthetic valve is well-seated, no evidence of  dehiscence, and no significant perivalvular regurgitation. No evidence of  valvular stenosis or regurgitation.  Mild pulmonic regurgitation.  S/P Bentall procedure - aortic root and proximal ascending aorta are  mildly dilated 88m.  Compared to study 07/14/2020 LVEF remains preserved, G1DD is now normal, Ao  root was 353mand now 3912m Past Medical History:  Diagnosis Date   Anxiety    Aortic root dilatation (HCC)    Bicuspid aortic valve 07/25/2018   Cancer (HCC)    prostate   Coronary artery disease    Depression    Erectile dysfunction    History of hiatal hernia    Hypercholesterolemia    Hyperglycemia    Hyperlipidemia    [place]  Ref[place]    Sleep apnea    [facility]   Past Surgical History:  Procedure Laterality Date   BENTALL PROCEDURE N/A 05/12/2020   Procedure: BENTALL PROCEDURE;  Surgeon: AtkWonda OldsD;  [facility];  Laterality: N/A;   COLONOSCOPY N/A 11/04/2014   Procedure: COLONOSCOPY;  Surgeon: Rogene Houston, MD;  [facility];  Service: Endoscopy;  Laterality: N/A;  830 - moved to 6/2 @ 10:30   CORONARY ANGIOPLASTY WITH STENT PLACEMENT     LEFT HEART CATHETERIZATION WITH CORONARY ANGIOGRAM N/A 04/20/2013   Procedure: LEFT HEART CATHETERIZATION WITH CORONARY ANGIOGRAM;  Surgeon: Laverda Page, MD;  Location: [facility] CATH LAB;  Service: Cardiovascular;  Laterality: N/A;   RIGHT/LEFT HEART CATH AND CORONARY ANGIOGRAPHY N/A 11/10/2019   Procedure: RIGHT/LEFT HEART CATH AND CORONARY ANGIOGRAPHY;  Surgeon: Nigel Mormon, MD;  Location: [place] [facility] LAB;  Service: Cardiovascular;  Laterality: N/A;   TEE WITHOUT CARDIOVERSION N/A 05/12/2020   Procedure: TRANSESOPHAGEAL ECHOCARDIOGRAM (TEE);  Surgeon: Wonda Olds, MD;  Location: [place];  Service: Open Heart Surgery;  Laterality: N/A;    MEDICATIONS:  acetaminophen (TYLENOL) 500 MG tablet   aspirin EC 325 MG EC tablet   carvedilol (COREG) 6.25 MG tablet   escitalopram (LEXAPRO) 10 MG tablet   escitalopram (LEXAPRO) 20 MG tablet   losartan (COZAAR) 25 MG tablet   Multiple Vitamins-Minerals (CENTRUM SILVER PO)   nitroGLYCERIN (NITROSTAT) 0.4 MG SL tablet   Omega-3 Fatty Acids (FISH OIL) 1200 MG CAPS   omeprazole (PRILOSEC) 20 MG capsule   polyethylene glycol (MIRALAX / GLYCOLAX) 17 g packet   rosuvastatin (CRESTOR) 20 MG tablet   No current facility-administered medications for this encounter.     Konrad Felix Ward, PA-C [facility] [phone]

## 2022-08-10 NOTE — H&P (Signed)
Office Visit Report     08/07/2022   --------------------------------------------------------------------------------   Devon Sanchez  MRN: [id number]  DOB: 08-23-1950, 72 year old Male  SSN:    PRIMARY CARE:     REFERRING:  Derrek Monaco, [place]  PROVIDER:  Raynelle Bring, M.D.  TREATING:  Leta Baptist [place], [place]  LOCATION:  [facility] [phone]     --------------------------------------------------------------------------------   CC/HPI: Pt presents today for pre-operative history and physical exam in anticipation of robotic assisted lap radical prostatectomy with bilateral pelvic lymph node dissection by Dr. Alinda Money on 08/13/22. He is doing well and is without complaint.   Received cardiac clearance by Dr. Einar Gip.   Pt denies F/C, HA, CP, SOB, N/V, diarrhea/constipation, back pain, flank pain, hematuria, and dysuria.     HX:   CC: Prostate Cancer   Physician requesting consult: Dr. Eda Keys  PCP: Dr. Wende Neighbors   Mr. Devon Sanchez is a 72 year old gentleman who was found to have an elevated PSA of 6.8 with a 5 mm right base prostate nodule. This prompted a TRUS biopsy of the prostate on 05/07/22 that demonstrated Gleason 3+4=7 adenocarcinoma of the prostate with 8 out of 12 biopsy cores positive for malignancy.   Family history: Father.   Imaging studies: None.   PMH: He has a history of CAD s/p cardiac stents in 2007 and 2014. 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 He is followed by Dr. Adrian Prows. He has a history of anxiety, depression, hyperlipidemia, sleep apnea, and myocardial infarction.  PSH: No abdominal surgeries.   TNM stage: cT2a Nx Mx  PSA: 6.8  Gleason score: 3+4=7 (GG 2)  Biopsy (05/07/22): 8/12 cores positive  Left: L lateral apex (40%, 3+4=7), L apex (5%, 3+3=6), L lateral mid (40%, 3+4=7), L mid (5%, 3+3=6)  Right: R apex (10%, 3+3=6),  R lateral apex (10%, 3+4=7), R mid (10%, 3+4=7), R lateral mid (90%, 3+4=7)  Prostate volume: 47 cc   Nomogram  OC disease: 37%  EPE: 62%  SVI: 11%  LNI: 10%  PFS (5 year, 10 year): 68%, 53%   Urinary function: IPSS is 5.  Erectile function: SHIM score is 7.     ALLERGIES: No Known Allergies    MEDICATIONS: Aspirin  Omeprazole  Carvedilol 6.25 mg tablet  Centrum Silver Men  Escitalopram Oxalate  Fish Oil  Flintstones With Iron  Losartan Potassium  Rosuvastatin Calcium  Vitamin B6     GU PSH: None     PSH Notes: aortic root repair for a thoracic aortic aneurysm in December 2021.   NON-GU PSH: Aortic valve replacement (tissue) Cardiac Stent Placement     GU PMH: Stress Incontinence - 07/17/2022 Prostate Cancer - 07/10/2022, - 06/26/2022      PMH Notes: anxiety  depression  hyperlipidemia  sleep apnea  myocardial infarction   1) Prostate cancer: He is s/p a UNS RAL radical prostatectomy and BPLND on 08/13/22.   Diagnosis:  Pretreatment PSA: 6.8  Pretreatment SHIM score: 7   NON-GU PMH: Muscle weakness (generalized) - 07/17/2022, - 07/10/2022 Other muscle spasm - 07/17/2022    FAMILY HISTORY: 4 daughters - Runs in Family Cancer of intestine - Uncle Diabetes - Grandmother, Mother Heart Disease - Grandmother, Father, Grandfather Kidney Stones - Father Prostate Cancer - Father Tuberculosis - Uncle   SOCIAL HISTORY: Marital Status: Married Preferred Language: English; Ethnicity: Not Hispanic Or  Latino; Race: White Current Smoking Status: Patient has never smoked.   Tobacco Use Assessment Completed: Used Tobacco in last 30 days? Does not use smokeless tobacco. Has never drank.  Does not use drugs. Drinks 4+ caffeinated drinks per day. Has had a blood transfusion.     Notes: transfusion with heart surgery   REVIEW OF SYSTEMS:    GU Review Male:   Patient denies frequent urination, hard to postpone urination, burning/ pain with urination, get up at night to  urinate, leakage of urine, stream starts and stops, trouble starting your stream, have to strain to urinate , erection problems, and penile pain.  Gastrointestinal (Upper):   Patient denies nausea, vomiting, and indigestion/ heartburn.  Gastrointestinal (Lower):   Patient denies diarrhea and constipation.  Constitutional:   Patient denies fever, night sweats, weight loss, and fatigue.  Skin:   Patient denies skin rash/ lesion and itching.  Eyes:   Patient denies blurred vision and double vision.  Ears/ Nose/ Throat:   Patient denies sore throat and sinus problems.  Hematologic/Lymphatic:   Patient denies swollen glands and easy bruising.  Cardiovascular:   Patient denies leg swelling and chest pains.  Respiratory:   Patient denies cough and shortness of breath.  Endocrine:   Patient denies excessive thirst.  Musculoskeletal:   Patient denies back pain and joint pain.  Neurological:   Patient denies headaches and dizziness.  Psychologic:   Patient denies depression and anxiety.   VITAL SIGNS:      08/07/2022 01:45 PM  Weight 243 lb / 110.22 kg  Height 74 in / 187.96 cm  BP 115/73 mmHg  Pulse 82 /min  Temperature 97.1 F / 36.1 C  BMI 31.2 kg/m   MULTI-SYSTEM PHYSICAL EXAMINATION:    Constitutional: Well-nourished. No physical deformities. Normally developed. Good grooming.  Neck: Neck symmetrical, not swollen. Normal tracheal position.  Respiratory: Normal breath sounds. No labored breathing, no use of accessory muscles.   Cardiovascular: Regular rate and rhythm. No murmur, no gallop.   Lymphatic: No enlargement of neck, axillae, groin.  Skin: No paleness, no jaundice, no cyanosis. No lesion, no ulcer, no rash.  Neurologic / Psychiatric: Oriented to time, oriented to place, oriented to person. No depression, no anxiety, no agitation.  Gastrointestinal: No mass, no tenderness, no rigidity, obese abdomen.   Eyes: Normal conjunctivae. Normal eyelids.  Ears, Nose, Mouth, and Throat: Left  ear no scars, no lesions, no masses. Right ear no scars, no lesions, no masses. Nose no scars, no lesions, no masses. Normal hearing. Normal lips.  Musculoskeletal: Normal gait and station of head and neck.     Complexity of Data:  Records Review:   Previous Patient Records  Urine Test Review:   Urinalysis   08/07/22  Urinalysis  Urine Appearance Clear   Urine Color Yellow   Urine Glucose Neg mg/dL  Urine Bilirubin Neg mg/dL  Urine Ketones Neg mg/dL  Urine Specific Gravity 1.025   Urine Blood Neg ery/uL  Urine pH 5.5   Urine Protein Neg mg/dL  Urine Urobilinogen 0.2 mg/dL  Urine Nitrites Neg   Urine Leukocyte Esterase Neg leu/uL   PROCEDURES:          Urinalysis - 81003 Dipstick Dipstick Cont'd  Color: Yellow Bilirubin: Neg mg/dL  Appearance: Clear Ketones: Neg mg/dL  Specific Gravity: 1.025 Blood: Neg ery/uL  pH: 5.5 Protein: Neg mg/dL  Glucose: Neg mg/dL Urobilinogen: 0.2 mg/dL    Nitrites: Neg    Leukocyte Esterase: Neg leu/uL  ASSESSMENT:      ICD-10 Details  1 GU:   Prostate Cancer - C61    PLAN:           Schedule Return Visit/Planned Activity: Keep Scheduled Appointment - Schedule Surgery          Document Letter(s):  Created for Patient: Clinical Summary         Notes:   There are no changes in the patients history or physical exam since last evaluation by Dr. Alinda Money. Pt is scheduled to undergo RALP with BPLND on 08/13/22.   All pt's questions were answered to the best of my ability.          Next Appointment:      Next Appointment: 08/13/2022 07:15 AM    Appointment Type: Surgery     Location: [facility] [phone]    Provider: Mcarthur Rossetti, PA    Reason for Visit: [facility]/OBS RA LAP RAD PROSTATECTOMY AND BPLND WITH DR. Alinda Money      * Signed by Mcarthur Rossetti, PA on 08/07/22 at 2:40 PM (EST*

## 2022-08-13 ENCOUNTER — Other Ambulatory Visit: Payer: Self-pay

## 2022-08-13 ENCOUNTER — Ambulatory Visit (HOSPITAL_BASED_OUTPATIENT_CLINIC_OR_DEPARTMENT_OTHER): Payer: Medicare Other | Admitting: Certified Registered Nurse Anesthetist

## 2022-08-13 ENCOUNTER — Observation Stay (HOSPITAL_COMMUNITY)
Admission: RE | Admit: 2022-08-13 | Discharge: 2022-08-14 | Disposition: A | Discharge status: Home / Self Care | Payer: Medicare Other | Source: Ambulatory Visit | Attending: Urology | Admitting: Urology

## 2022-08-13 ENCOUNTER — Encounter (HOSPITAL_COMMUNITY): Payer: Self-pay | Admitting: Urology

## 2022-08-13 ENCOUNTER — Encounter (HOSPITAL_COMMUNITY)
Admission: RE | Disposition: A | Discharge status: Home / Self Care | Payer: Self-pay | Source: Ambulatory Visit | Attending: Urology

## 2022-08-13 ENCOUNTER — Ambulatory Visit (HOSPITAL_COMMUNITY): Payer: Medicare Other | Admitting: Physician Assistant

## 2022-08-13 DIAGNOSIS — Z9989 Dependence on other enabling machines and devices: Secondary | ICD-10-CM | POA: Diagnosis not present

## 2022-08-13 DIAGNOSIS — I2510 Atherosclerotic heart disease of native coronary artery without angina pectoris: Secondary | ICD-10-CM

## 2022-08-13 DIAGNOSIS — Z955 Presence of coronary angioplasty implant and graft: Secondary | ICD-10-CM | POA: Diagnosis not present

## 2022-08-13 DIAGNOSIS — G4733 Obstructive sleep apnea (adult) (pediatric): Secondary | ICD-10-CM | POA: Diagnosis not present

## 2022-08-13 DIAGNOSIS — Z79899 Other long term (current) drug therapy: Secondary | ICD-10-CM | POA: Diagnosis not present

## 2022-08-13 DIAGNOSIS — I252 Old myocardial infarction: Secondary | ICD-10-CM | POA: Diagnosis not present

## 2022-08-13 DIAGNOSIS — C61 Malignant neoplasm of prostate: Principal | ICD-10-CM | POA: Insufficient documentation

## 2022-08-13 DIAGNOSIS — Z7982 Long term (current) use of aspirin: Secondary | ICD-10-CM | POA: Diagnosis not present

## 2022-08-13 HISTORY — PX: ROBOT ASSISTED LAPAROSCOPIC RADICAL PROSTATECTOMY: SHX5141

## 2022-08-13 HISTORY — PX: LYMPHADENECTOMY: SHX5960

## 2022-08-13 LAB — TYPE AND SCREEN
ABO/RH(D): A POS
Antibody Screen: NEGATIVE

## 2022-08-13 LAB — HEMOGLOBIN AND HEMATOCRIT, BLOOD
HCT: 35.4 % — ABNORMAL LOW (ref 39.0–52.0)
Hemoglobin: 12.1 g/dL — ABNORMAL LOW (ref 13.0–17.0)

## 2022-08-13 SURGERY — XI ROBOTIC ASSISTED LAPAROSCOPIC RADICAL PROSTATECTOMY LEVEL 2
Anesthesia: General

## 2022-08-13 MED ORDER — PANTOPRAZOLE SODIUM 40 MG PO TBEC
40.0000 mg | DELAYED_RELEASE_TABLET | Freq: Every day | ORAL | Status: DC
  Administered 2022-08-14: 40 mg via ORAL
  Filled 2022-08-13: qty 1

## 2022-08-13 MED ORDER — FENTANYL CITRATE (PF) 250 MCG/5ML IJ SOLN
INTRAMUSCULAR | Status: AC
  Filled 2022-08-13: qty 5

## 2022-08-13 MED ORDER — MORPHINE SULFATE (PF) 2 MG/ML IV SOLN
2.0000 mg | INTRAVENOUS | Status: DC | PRN
  Administered 2022-08-13 – 2022-08-14 (×4): 2 mg via INTRAVENOUS
  Filled 2022-08-13 (×4): qty 1

## 2022-08-13 MED ORDER — BUPIVACAINE HCL (PF) 0.25 % IJ SOLN
INTRAMUSCULAR | Status: DC | PRN
  Administered 2022-08-13: 30 mL

## 2022-08-13 MED ORDER — ASPIRIN 81 MG PO CHEW
81.0000 mg | CHEWABLE_TABLET | Freq: Every day | ORAL | Status: DC
  Administered 2022-08-14: 81 mg via ORAL
  Filled 2022-08-13: qty 1

## 2022-08-13 MED ORDER — EPHEDRINE SULFATE-NACL 50-0.9 MG/10ML-% IV SOSY
PREFILLED_SYRINGE | INTRAVENOUS | Status: DC | PRN
  Administered 2022-08-13 (×5): 5 mg via INTRAVENOUS

## 2022-08-13 MED ORDER — DIPHENHYDRAMINE HCL 12.5 MG/5ML PO ELIX: 12.5000 mg | ORAL_SOLUTION | Freq: Four times a day (QID) | ORAL | Status: DC | PRN

## 2022-08-13 MED ORDER — FLEET ENEMA 7-19 GM/118ML RE ENEM: 1.0000 | ENEMA | Freq: Once | RECTAL | Status: DC

## 2022-08-13 MED ORDER — TRAMADOL HCL 50 MG PO TABS: 50.0000 mg | ORAL_TABLET | Freq: Four times a day (QID) | ORAL | 0 refills | Status: DC | PRN

## 2022-08-13 MED ORDER — ROCURONIUM BROMIDE 10 MG/ML (PF) SYRINGE
PREFILLED_SYRINGE | INTRAVENOUS | Status: AC
  Filled 2022-08-13: qty 10

## 2022-08-13 MED ORDER — PHENYLEPHRINE 80 MCG/ML (10ML) SYRINGE FOR IV PUSH (FOR BLOOD PRESSURE SUPPORT)
PREFILLED_SYRINGE | INTRAVENOUS | Status: DC | PRN
  Administered 2022-08-13 (×2): 80 ug via INTRAVENOUS

## 2022-08-13 MED ORDER — ROCURONIUM BROMIDE 10 MG/ML (PF) SYRINGE
PREFILLED_SYRINGE | INTRAVENOUS | Status: DC | PRN
  Administered 2022-08-13: 30 mg via INTRAVENOUS
  Administered 2022-08-13: 20 mg via INTRAVENOUS
  Administered 2022-08-13: 70 mg via INTRAVENOUS

## 2022-08-13 MED ORDER — GLYCOPYRROLATE 0.2 MG/ML IJ SOLN
INTRAMUSCULAR | Status: AC
  Filled 2022-08-13: qty 1

## 2022-08-13 MED ORDER — KCL IN DEXTROSE-NACL 20-5-0.45 MEQ/L-%-% IV SOLN
INTRAVENOUS | Status: DC
  Filled 2022-08-13 (×3): qty 1000

## 2022-08-13 MED ORDER — LACTATED RINGERS IV SOLN: INTRAVENOUS | Status: DC

## 2022-08-13 MED ORDER — NITROGLYCERIN 0.4 MG SL SUBL: 0.4000 mg | SUBLINGUAL_TABLET | SUBLINGUAL | Status: DC | PRN

## 2022-08-13 MED ORDER — DOCUSATE SODIUM 100 MG PO CAPS: 100.0000 mg | ORAL_CAPSULE | Freq: Two times a day (BID) | ORAL | Status: AC

## 2022-08-13 MED ORDER — DEXAMETHASONE SODIUM PHOSPHATE 10 MG/ML IJ SOLN
INTRAMUSCULAR | Status: AC
  Filled 2022-08-13: qty 1

## 2022-08-13 MED ORDER — MUPIROCIN 2 % EX OINT
1.0000 | TOPICAL_OINTMENT | Freq: Two times a day (BID) | CUTANEOUS | Status: DC
  Administered 2022-08-13 – 2022-08-14 (×3): 1 via NASAL
  Filled 2022-08-13 (×2): qty 22

## 2022-08-13 MED ORDER — HYOSCYAMINE SULFATE 0.125 MG SL SUBL: 0.1250 mg | SUBLINGUAL_TABLET | Freq: Four times a day (QID) | SUBLINGUAL | Status: DC | PRN

## 2022-08-13 MED ORDER — BUPIVACAINE-EPINEPHRINE (PF) 0.25% -1:200000 IJ SOLN
INTRAMUSCULAR | Status: AC
  Filled 2022-08-13: qty 30

## 2022-08-13 MED ORDER — HYDROMORPHONE HCL 1 MG/ML IJ SOLN
INTRAMUSCULAR | Status: DC | PRN
  Administered 2022-08-13 (×4): .5 mg via INTRAVENOUS

## 2022-08-13 MED ORDER — SUGAMMADEX SODIUM 200 MG/2ML IV SOLN
INTRAVENOUS | Status: DC | PRN
  Administered 2022-08-13: 200 mg via INTRAVENOUS

## 2022-08-13 MED ORDER — LACTATED RINGERS IV SOLN
INTRAVENOUS | Status: DC | PRN
  Administered 2022-08-13: 1000 mL

## 2022-08-13 MED ORDER — ACETAMINOPHEN 10 MG/ML IV SOLN
1000.0000 mg | Freq: Four times a day (QID) | INTRAVENOUS | Status: AC
  Administered 2022-08-13 – 2022-08-14 (×4): 1000 mg via INTRAVENOUS
  Filled 2022-08-13 (×4): qty 100

## 2022-08-13 MED ORDER — ONDANSETRON HCL 4 MG/2ML IJ SOLN: 4.0000 mg | INTRAMUSCULAR | Status: DC | PRN

## 2022-08-13 MED ORDER — STERILE WATER FOR IRRIGATION IR SOLN
Status: DC | PRN
  Administered 2022-08-13: 1000 mL

## 2022-08-13 MED ORDER — TRIPLE ANTIBIOTIC 3.5-400-5000 EX OINT: 1.0000 | TOPICAL_OINTMENT | Freq: Three times a day (TID) | CUTANEOUS | Status: DC | PRN

## 2022-08-13 MED ORDER — CEFAZOLIN SODIUM-DEXTROSE 1-4 GM/50ML-% IV SOLN
1.0000 g | Freq: Three times a day (TID) | INTRAVENOUS | Status: AC
  Administered 2022-08-13 – 2022-08-14 (×2): 1 g via INTRAVENOUS
  Filled 2022-08-13 (×2): qty 50

## 2022-08-13 MED ORDER — OXYCODONE HCL 5 MG PO TABS: 5.0000 mg | ORAL_TABLET | Freq: Once | ORAL | Status: DC | PRN

## 2022-08-13 MED ORDER — MIDAZOLAM HCL 5 MG/5ML IJ SOLN
INTRAMUSCULAR | Status: DC | PRN
  Administered 2022-08-13: 2 mg via INTRAVENOUS

## 2022-08-13 MED ORDER — ORAL CARE MOUTH RINSE: 15.0000 mL | OROMUCOSAL | Status: DC | PRN

## 2022-08-13 MED ORDER — ESCITALOPRAM OXALATE 20 MG PO TABS
20.0000 mg | ORAL_TABLET | Freq: Every day | ORAL | Status: DC
  Administered 2022-08-14: 20 mg via ORAL
  Filled 2022-08-13: qty 1

## 2022-08-13 MED ORDER — ORAL CARE MOUTH RINSE: 15.0000 mL | Freq: Once | OROMUCOSAL | Status: DC

## 2022-08-13 MED ORDER — HEPARIN SODIUM (PORCINE) 1000 UNIT/ML IJ SOLN
INTRAMUSCULAR | Status: AC
  Filled 2022-08-13: qty 1

## 2022-08-13 MED ORDER — MAGNESIUM CITRATE PO SOLN: 1.0000 | Freq: Once | ORAL | Status: DC

## 2022-08-13 MED ORDER — CARVEDILOL 6.25 MG PO TABS
6.2500 mg | ORAL_TABLET | Freq: Two times a day (BID) | ORAL | Status: DC
  Administered 2022-08-13 – 2022-08-14 (×2): 6.25 mg via ORAL
  Filled 2022-08-13 (×2): qty 1

## 2022-08-13 MED ORDER — LIDOCAINE 2% (20 MG/ML) 5 ML SYRINGE
INTRAMUSCULAR | Status: DC | PRN
  Administered 2022-08-13: 100 mg via INTRAVENOUS

## 2022-08-13 MED ORDER — DIPHENHYDRAMINE HCL 50 MG/ML IJ SOLN: 12.5000 mg | Freq: Four times a day (QID) | INTRAMUSCULAR | Status: DC | PRN

## 2022-08-13 MED ORDER — SULFAMETHOXAZOLE-TRIMETHOPRIM 800-160 MG PO TABS: 1.0000 | ORAL_TABLET | Freq: Two times a day (BID) | ORAL | 0 refills | Status: AC

## 2022-08-13 MED ORDER — ONDANSETRON HCL 4 MG/2ML IJ SOLN
INTRAMUSCULAR | Status: AC
  Filled 2022-08-13: qty 2

## 2022-08-13 MED ORDER — SODIUM CHLORIDE 0.9 % IR SOLN
Status: DC | PRN
  Administered 2022-08-13: 1000 mL via INTRAVESICAL

## 2022-08-13 MED ORDER — DOCUSATE SODIUM 100 MG PO CAPS
100.0000 mg | ORAL_CAPSULE | Freq: Two times a day (BID) | ORAL | Status: DC
  Administered 2022-08-13 – 2022-08-14 (×2): 100 mg via ORAL
  Filled 2022-08-13 (×2): qty 1

## 2022-08-13 MED ORDER — MIDAZOLAM HCL 2 MG/2ML IJ SOLN
INTRAMUSCULAR | Status: AC
  Filled 2022-08-13: qty 2

## 2022-08-13 MED ORDER — FENTANYL CITRATE (PF) 100 MCG/2ML IJ SOLN
INTRAMUSCULAR | Status: DC | PRN
  Administered 2022-08-13: 50 ug via INTRAVENOUS
  Administered 2022-08-13: 100 ug via INTRAVENOUS
  Administered 2022-08-13 (×2): 50 ug via INTRAVENOUS

## 2022-08-13 MED ORDER — SODIUM CHLORIDE 0.9 % IV BOLUS
1000.0000 mL | Freq: Once | INTRAVENOUS | Status: AC
  Administered 2022-08-13: 1000 mL via INTRAVENOUS

## 2022-08-13 MED ORDER — CHLORHEXIDINE GLUCONATE 0.12 % MT SOLN: 15.0000 mL | Freq: Once | OROMUCOSAL | Status: DC

## 2022-08-13 MED ORDER — FLUORESCEIN SODIUM 10 % IV SOLN
INTRAVENOUS | Status: AC
  Filled 2022-08-13: qty 5

## 2022-08-13 MED ORDER — LOSARTAN POTASSIUM 25 MG PO TABS
25.0000 mg | ORAL_TABLET | Freq: Every evening | ORAL | Status: DC
  Administered 2022-08-13: 25 mg via ORAL
  Filled 2022-08-13: qty 1

## 2022-08-13 MED ORDER — ONDANSETRON HCL 4 MG/2ML IJ SOLN: 4.0000 mg | Freq: Once | INTRAMUSCULAR | Status: DC | PRN

## 2022-08-13 MED ORDER — HYDROMORPHONE HCL 1 MG/ML IJ SOLN: 0.2500 mg | INTRAMUSCULAR | Status: DC | PRN

## 2022-08-13 MED ORDER — PROPOFOL 10 MG/ML IV BOLUS
INTRAVENOUS | Status: AC
  Filled 2022-08-13: qty 20

## 2022-08-13 MED ORDER — OXYCODONE HCL 5 MG/5ML PO SOLN: 5.0000 mg | Freq: Once | ORAL | Status: DC | PRN

## 2022-08-13 MED ORDER — FLUORESCEIN SODIUM 10 % IV SOLN
INTRAVENOUS | Status: DC | PRN
  Administered 2022-08-13: 100 mg via INTRAVENOUS

## 2022-08-13 MED ORDER — CEFAZOLIN SODIUM-DEXTROSE 2-4 GM/100ML-% IV SOLN
2.0000 g | INTRAVENOUS | Status: AC
  Administered 2022-08-13: 2 g via INTRAVENOUS
  Filled 2022-08-13: qty 100

## 2022-08-13 MED ORDER — ONDANSETRON HCL 4 MG/2ML IJ SOLN
INTRAMUSCULAR | Status: DC | PRN
  Administered 2022-08-13: 4 mg via INTRAVENOUS

## 2022-08-13 MED ORDER — PROPOFOL 10 MG/ML IV BOLUS
INTRAVENOUS | Status: DC | PRN
  Administered 2022-08-13: 200 mg via INTRAVENOUS

## 2022-08-13 MED ORDER — ZOLPIDEM TARTRATE 5 MG PO TABS
5.0000 mg | ORAL_TABLET | Freq: Every evening | ORAL | Status: DC | PRN
  Administered 2022-08-14: 5 mg via ORAL
  Filled 2022-08-13: qty 1

## 2022-08-13 MED ORDER — LIDOCAINE HCL (PF) 2 % IJ SOLN
INTRAMUSCULAR | Status: AC
  Filled 2022-08-13: qty 5

## 2022-08-13 MED ORDER — ROSUVASTATIN CALCIUM 20 MG PO TABS
20.0000 mg | ORAL_TABLET | Freq: Every day | ORAL | Status: DC
  Administered 2022-08-14: 20 mg via ORAL
  Filled 2022-08-13: qty 1

## 2022-08-13 MED ORDER — HYDROMORPHONE HCL 2 MG/ML IJ SOLN
INTRAMUSCULAR | Status: AC
  Filled 2022-08-13: qty 1

## 2022-08-13 MED ORDER — GLYCOPYRROLATE 0.2 MG/ML IJ SOLN
INTRAMUSCULAR | Status: DC | PRN
  Administered 2022-08-13: .2 mg via INTRAVENOUS

## 2022-08-13 MED ORDER — DEXAMETHASONE SODIUM PHOSPHATE 10 MG/ML IJ SOLN
INTRAMUSCULAR | Status: DC | PRN
  Administered 2022-08-13: 10 mg via INTRAVENOUS

## 2022-08-13 SURGICAL SUPPLY — 69 items
ADH SKN CLS APL DERMABOND .7 (GAUZE/BANDAGES/DRESSINGS) ×2
APL PRP STRL LF DISP 70% ISPRP (MISCELLANEOUS) ×2
APL SWBSTK 6 STRL LF DISP (MISCELLANEOUS) ×2
APPLICATOR COTTON TIP 6 STRL (MISCELLANEOUS) ×2 IMPLANT
APPLICATOR COTTON TIP 6IN STRL (MISCELLANEOUS) ×2
BAG COUNTER SPONGE SURGICOUNT (BAG) IMPLANT
BAG SPNG CNTER NS LX DISP (BAG)
CATH FOLEY 2WAY SLVR 18FR 30CC (CATHETERS) ×2 IMPLANT
CATH ROBINSON RED A/P 16FR (CATHETERS) ×2 IMPLANT
CATH ROBINSON RED A/P 8FR (CATHETERS) ×2 IMPLANT
CATH TIEMANN FOLEY 18FR 5CC (CATHETERS) ×2 IMPLANT
CHLORAPREP W/TINT 26 (MISCELLANEOUS) ×2 IMPLANT
CLIP LIGATING HEM O LOK PURPLE (MISCELLANEOUS) ×2 IMPLANT
COVER SURGICAL LIGHT HANDLE (MISCELLANEOUS) ×2 IMPLANT
COVER TIP SHEARS 8 DVNC (MISCELLANEOUS) ×2 IMPLANT
COVER TIP SHEARS 8MM DA VINCI (MISCELLANEOUS) ×2
CUTTER ECHEON FLEX ENDO 45 340 (ENDOMECHANICALS) ×2 IMPLANT
DERMABOND ADVANCED .7 DNX12 (GAUZE/BANDAGES/DRESSINGS) ×2 IMPLANT
DRAIN CHANNEL RND F F (WOUND CARE) IMPLANT
DRAPE ARM DVNC X/XI (DISPOSABLE) ×8 IMPLANT
DRAPE COLUMN DVNC XI (DISPOSABLE) ×2 IMPLANT
DRAPE DA VINCI XI ARM (DISPOSABLE) ×8
DRAPE DA VINCI XI COLUMN (DISPOSABLE) ×2
DRAPE SURG IRRIG POUCH 19X23 (DRAPES) ×2 IMPLANT
DRSG TEGADERM 4X4.75 (GAUZE/BANDAGES/DRESSINGS) ×2 IMPLANT
ELECT PENCIL ROCKER SW 15FT (MISCELLANEOUS) ×2 IMPLANT
ELECT REM PT RETURN 15FT ADLT (MISCELLANEOUS) ×2 IMPLANT
GAUZE 4X4 16PLY ~~LOC~~+RFID DBL (SPONGE) ×2 IMPLANT
GAUZE SPONGE 4X4 12PLY STRL (GAUZE/BANDAGES/DRESSINGS) ×2 IMPLANT
GLOVE BIO SURGEON STRL SZ 6.5 (GLOVE) ×2 IMPLANT
GLOVE SURG LX STRL 7.5 STRW (GLOVE) ×4 IMPLANT
GOWN SRG XL LVL 4 BRTHBL STRL (GOWNS) ×2 IMPLANT
GOWN STRL NON-REIN XL LVL4 (GOWNS) ×2
GOWN STRL REUS W/ TWL XL LVL3 (GOWN DISPOSABLE) ×4 IMPLANT
GOWN STRL REUS W/TWL XL LVL3 (GOWN DISPOSABLE) ×4
HOLDER FOLEY CATH W/STRAP (MISCELLANEOUS) ×2 IMPLANT
IRRIG SUCT STRYKERFLOW 2 WTIP (MISCELLANEOUS) ×2
IRRIGATION SUCT STRKRFLW 2 WTP (MISCELLANEOUS) ×2 IMPLANT
IV LACTATED RINGERS 1000ML (IV SOLUTION) ×2 IMPLANT
KIT TURNOVER KIT A (KITS) IMPLANT
NDL SAFETY ECLIP 18X1.5 (MISCELLANEOUS) ×2 IMPLANT
PACK ROBOT UROLOGY CUSTOM (CUSTOM PROCEDURE TRAY) ×2 IMPLANT
RELOAD STAPLE 45 4.1 GRN THCK (STAPLE) ×2 IMPLANT
SEAL CANN UNIV 5-8 DVNC XI (MISCELLANEOUS) ×8 IMPLANT
SEAL XI 5MM-8MM UNIVERSAL (MISCELLANEOUS) ×8
SET TUBE SMOKE EVAC HIGH FLOW (TUBING) ×2 IMPLANT
SOL ELECTROSURG ANTI STICK (MISCELLANEOUS) ×2
SOLUTION ELECTROSURG ANTI STCK (MISCELLANEOUS) ×2 IMPLANT
SPIKE FLUID TRANSFER (MISCELLANEOUS) ×2 IMPLANT
STAPLE RELOAD 45 GRN (STAPLE) ×2 IMPLANT
STAPLE RELOAD 45MM GREEN (STAPLE) ×2
SUT ETHILON 3 0 PS 1 (SUTURE) ×2 IMPLANT
SUT MNCRL 3 0 RB1 (SUTURE) ×2 IMPLANT
SUT MNCRL 3 0 VIOLET RB1 (SUTURE) ×2 IMPLANT
SUT MNCRL AB 4-0 PS2 18 (SUTURE) ×4 IMPLANT
SUT MONOCRYL 3 0 RB1 (SUTURE) ×4
SUT PDS PLUS 0 (SUTURE) ×4
SUT PDS PLUS AB 0 CT-2 (SUTURE) ×4 IMPLANT
SUT VIC AB 0 CT1 27 (SUTURE) ×4
SUT VIC AB 0 CT1 27XBRD ANTBC (SUTURE) ×4 IMPLANT
SUT VIC AB 2-0 SH 27 (SUTURE) ×2
SUT VIC AB 2-0 SH 27X BRD (SUTURE) ×2 IMPLANT
SUT VIC AB 3-0 SH 27 (SUTURE) ×4
SUT VIC AB 3-0 SH 27XBRD (SUTURE) IMPLANT
SYR 27GX1/2 1ML LL SAFETY (SYRINGE) ×2 IMPLANT
TOWEL OR NON WOVEN STRL DISP B (DISPOSABLE) ×2 IMPLANT
TROCAR Z THREAD OPTICAL 12X100 (TROCAR) IMPLANT
TROCAR Z-THREAD FIOS 5X100MM (TROCAR) IMPLANT
WATER STERILE IRR 1000ML POUR (IV SOLUTION) ×2 IMPLANT

## 2022-08-13 NOTE — Progress Notes (Signed)
Patient ID: Devon Sanchez, male   DOB: 1950-07-09, 72 y.o.   MRN: [id number]  Post-op note  Subjective: The patient is doing well.  No complaints.  Objective: Vital signs in last 24 hours: Temp:  [97.7 F (36.5 C)-98.1 F (36.7 C)] 97.7 F (36.5 C) (03/11 1100) Pulse Rate:  [67-86] 73 (03/11 1200) Resp:  [11-17] 16 (03/11 1200) BP: (123-142)/(72-103) 124/72 (03/11 1200) SpO2:  [87 %-95 %] 92 % (03/11 1200) Weight:  [id number] kg] 112 kg (03/11 0551)  Intake/Output from previous day: No intake/output data recorded. Intake/Output this shift: Total I/O In: 3300 [I.V.:2200; IV Piggyback:1100] Out: 230 [Urine:40; Drains:40; Blood:150]  Physical Exam:  General: Alert and oriented. Abdomen: Soft, Nondistended. Incisions: Clean and dry. GU: Urine clearing.  Lab Results: Recent Labs    08/13/22 1140  HGB 12.1*  HCT 35.4*    Assessment/Plan: POD#0   1) Continue to monitor, ambulate, IS, cardiac monitoring overnight   Devon Sanchez. MD   LOS: 0 days   Devon Sanchez 08/13/2022, 4:22 PM

## 2022-08-13 NOTE — Interval H&P Note (Signed)
History and Physical Interval Note:  08/13/2022 6:59 AM  Devon Sanchez  has presented today for surgery, with the diagnosis of PROSTATE CANCER.  The various methods of treatment have been discussed with the patient and family. After consideration of risks, benefits and other options for treatment, the patient has consented to  Procedure(s) with comments: XI ROBOTIC ASSISTED LAPAROSCOPIC RADICAL PROSTATECTOMY LEVEL 2 (N/A) - 210 MINUTES NEEDED FOR CASE BILATERAL PELVIC LYMPHADENECTOMY (Bilateral) as a surgical intervention.  The patient's history has been reviewed, patient examined, no change in status, stable for surgery.  I have reviewed the patient's chart and labs.  Questions were answered to the patient's satisfaction.     [employer]

## 2022-08-13 NOTE — Plan of Care (Signed)
  Problem: Education: Goal: Knowledge of General Education information will improve Description: Including pain rating scale, medication(s)/side effects and non-pharmacologic comfort measures Outcome: Completed/Met   Problem: Clinical Measurements: Goal: Diagnostic test results will improve Outcome: Completed/Met Goal: Respiratory complications will improve Outcome: Completed/Met Goal: Cardiovascular complication will be avoided Outcome: Completed/Met   

## 2022-08-13 NOTE — Op Note (Addendum)
Preoperative diagnosis: Clinically localized adenocarcinoma of the prostate (clinical stage T2a Nx Mx)  Postoperative diagnosis: Clinically localized adenocarcinoma of the prostate (clinical stage T2a Nx Mx)  Procedure:  Robotic assisted laparoscopic radical prostatectomy (left nerve sparing) Bilateral robotic assisted laparoscopic pelvic lymphadenectomy  Surgeon: Pryor Curia. M.D.  Assistant(s): Debbrah Alar, PA-C  An assistant was required for this surgical procedure.  The duties of the assistant included but were not limited to suctioning, passing suture, camera manipulation, retraction. This procedure would not be able to be performed without an Environmental consultant.   Resident: Dr. Josph Macho  Anesthesia: General  EBL: 150 mL  IVF:  1700 mL crystalloid  Specimens: Prostate and seminal vesicles Right pelvic lymph nodes Left pelvic lymph nodes  Disposition of specimens: Pathology  Drains: 20 Fr coude catheter # 19 Blake pelvic drain  Indication: Devon Sanchez is a 72 y.o. patient with clinically localized prostate cancer.  After a thorough review of the management options for treatment of prostate cancer, he elected to proceed with surgical therapy and the above procedure(s).  We have discussed the potential benefits and risks of the procedure, side effects of the proposed treatment, the likelihood of the patient achieving the goals of the procedure, and any potential problems that might occur during the procedure or recuperation. Informed consent has been obtained.  Description of procedure:  The patient was taken to the operating room and a general anesthetic was administered. He was given preoperative antibiotics, placed in the dorsal lithotomy position, and prepped and draped in the usual sterile fashion. Next a preoperative timeout was performed. A urethral catheter was placed into the bladder and a site was selected near the umbilicus for placement of the camera  port. This was placed using a standard open Hassan technique which allowed entry into the peritoneal cavity under direct vision and without difficulty. An 8 mm port was placed and a pneumoperitoneum established. The camera was then used to inspect the abdomen and there was no evidence of any intra-abdominal injuries or other abnormalities. The remaining abdominal ports were then placed. 8 mm robotic ports were placed in the right lower quadrant, left lower quadrant, and far left lateral abdominal wall. A 5 mm port was placed in the right upper quadrant and a 12 mm port was placed in the right lateral abdominal wall for laparoscopic assistance. All ports were placed under direct vision without difficulty. The surgical cart was then docked.   Utilizing the cautery scissors, the bladder was reflected posteriorly allowing entry into the space of Retzius and identification of the endopelvic fascia and prostate. The periprostatic fat was then removed from the prostate allowing full exposure of the endopelvic fascia. The endopelvic fascia was then incised from the apex back to the base of the prostate bilaterally and the underlying levator muscle fibers were swept laterally off the prostate thereby isolating the dorsal venous complex. The dorsal vein was then stapled and divided with a 45 mm Flex Echelon stapler. Attention then turned to the bladder neck which was divided anteriorly thereby allowing entry into the bladder and exposure of the urethral catheter. The catheter balloon was deflated and the catheter was brought into the operative field and used to retract the prostate anteriorly. The posterior bladder neck was then examined and was divided allowing further dissection between the bladder and prostate posteriorly.  During this dissection, a small opening in the posterior bladder mucosa was noted.  Fluorescein was administered IV to allow identification of the ureteral  orifices and a 2 layer repair with 3-0  Vicryl suture was performed with confirmation of patency of the ureteral orifices.  The dissection then again proceeded until the vasa deferentia and seminal vessels were identified. The vasa deferentia were isolated, divided, and lifted anteriorly. The seminal vesicles were dissected down to their tips with care to control the seminal vascular arterial blood supply. These structures were then lifted anteriorly and the space between Denonvillier's fascia and the anterior rectum was developed with a combination of sharp and blunt dissection. This isolated the vascular pedicles of the prostate.  The lateral prostatic fascia on the left side of the prostate was then sharply incised allowing release of the neurovascular bundle. The vascular pedicle of the prostate on the left side was then ligated with Weck clips between the prostate and neurovascular bundle and divided with sharp cold scissor dissection resulting in neurovascular bundle preservation. On the right side, a wide non nerve sparing dissection was performed with Weck clips used to ligate the vascular pedicle of the prostate. The neurovascular bundle on the left side was then separated off the apex of the prostate and urethra.  The urethra was then sharply transected allowing the prostate specimen to be disarticulated. The pelvis was copiously irrigated and hemostasis was ensured. There was no evidence for rectal injury.  Attention then turned to the right pelvic sidewall. The fibrofatty tissue between the external iliac vein, confluence of the iliac vessels, hypogastric artery, and Cooper's ligament was dissected free from the pelvic sidewall with care to preserve the obturator nerve. Weck clips were used for lymphostasis and hemostasis. An identical procedure was performed on the contralateral side and the lymphatic packets were removed for permanent pathologic analysis.  Attention then turned to the urethral anastomosis. A 2-0 Vicryl slip knot was  placed between Denonvillier's fascia, the posterior bladder neck, and the posterior urethra to reapproximate these structures. A double-armed 3-0 Monocryl suture was then used to perform a 360 running tension-free anastomosis between the bladder neck and urethra. A new urethral catheter was then placed into the bladder and irrigated. There were no blood clots within the bladder and the anastomosis appeared to be watertight. A #19 Blake drain was then brought through the left lateral 8 mm port site and positioned appropriately within the pelvis. It was secured to the skin with a nylon suture. The surgical cart was then undocked. The right lateral 12 mm port site was closed at the fascial level with a 0 Vicryl suture placed laparoscopically. All remaining ports were then removed under direct vision. The prostate specimen was removed intact within the Endopouch retrieval bag via the periumbilical camera port site. This fascial opening was closed with two running 0 PDS sutures. 0.25% Marcaine was then injected into all port sites and all incisions were reapproximated at the skin level with 4-0 Monocryl subcuticular sutures and Dermabond. The patient appeared to tolerate the procedure well and without complications. The patient was able to be extubated and transferred to the recovery unit in satisfactory condition.   Pryor Curia MD

## 2022-08-13 NOTE — Discharge Instructions (Signed)
Activity:  You are encouraged to ambulate frequently (about every hour during waking hours) to help prevent blood clots from forming in your legs or lungs.  However, you should not engage in any heavy lifting (> 10-15 lbs), strenuous activity, or straining. Diet: You should continue a clear liquid diet until passing gas from below.  Once this occurs, you may advance your diet to a soft diet that would be easy to digest (i.e soups, scrambled eggs, mashed potatoes, etc.) for 24 hours just as you would if getting over a bad stomach flu.  If tolerating this diet well for 24 hours, you may then begin eating regular food.  It will be normal to have some amount of bloating, nausea, and abdominal discomfort intermittently. Prescriptions:  You will be provided a prescription for pain medication to take as needed.  If your pain is not severe enough to require the prescription pain medication, you may take Tylenol instead.  You should also take an over the counter stool softener (Colace 100 mg twice daily) to avoid straining with bowel movements as the pain medication may constipate you. Finally, you will also be provided a prescription for an antibiotic to begin the day prior to your return visit in the office for catheter removal. Catheter care: You will be taught how to take care of the catheter by the nursing staff prior to discharge from the hospital.  You may use both a leg bag and the larger bedside bag but it is recommended to at least use the bigger bedside bag at nighttime as the leg bag is small and will fill up overnight and also does not drain as well when lying flat. You may periodically feel a strong urge to void with the catheter in place.  This is a bladder spasm and most often can occur when having a bowel movement or when you are moving around. It is typically self-limited and usually will stop after a few minutes.  You may use some Vaseline or Neosporin around the tip of the catheter to reduce friction  at the tip of the penis. Incisions: You may remove your dressing bandages the 2nd day after surgery.  You most likely will have a few small staples in each of the incisions and once the bandages are removed, the incisions may stay open to air.  You may start showering (not soaking or bathing in water) 48 hours after surgery and the incisions simply need to be patted dry after the shower.  No additional care is needed. What to call us about: You should call the office [phone]) if you develop fever > 101, persistent vomiting, or the catheter stops draining. Also, feel free to call with any other questions you may have and remember the handout that was provided to you as a reference preoperatively which answers many of the common questions that arise after surgery. You may resume advil, aleve, vitamins, and supplements 7 days after surgery.

## 2022-08-13 NOTE — Transfer of Care (Signed)
Immediate Anesthesia Transfer of Care Note  Patient: Devon Sanchez  Procedure(s) Performed: XI ROBOTIC ASSISTED LAPAROSCOPIC RADICAL PROSTATECTOMY LEVEL 2 BILATERAL PELVIC LYMPHADENECTOMY (Bilateral)  Patient Location: PACU  Anesthesia Type:General  Level of Consciousness: awake, alert , and oriented  Airway & Oxygen Therapy: Patient Spontanous Breathing and Patient connected to face mask oxygen  Post-op Assessment: Report given to RN and Post -op Vital signs reviewed and stable  Post vital signs: Reviewed and stable  Last Vitals:  Vitals Value Taken Time  BP 142/103 08/13/22 1100  Temp    Pulse 80 08/13/22 1101  Resp 18 08/13/22 1101  SpO2 94 % 08/13/22 1101  Vitals shown include unvalidated device data.  Last Pain:  Vitals:   08/13/22 0542  TempSrc: Oral         Complications: No notable events documented.

## 2022-08-13 NOTE — Anesthesia Procedure Notes (Signed)
Procedure Name: Intubation Date/Time: 08/13/2022 7:24 AM  Performed by: Maxwell Caul, CRNAPre-anesthesia Checklist: Patient identified, Emergency Drugs available, Suction available and Patient being monitored Patient Re-evaluated:Patient Re-evaluated prior to induction Oxygen Delivery Method: Circle system utilized Preoxygenation: Pre-oxygenation with 100% oxygen Induction Type: IV induction Ventilation: Mask ventilation without difficulty Laryngoscope Size: Mac and 4 Grade View: Grade II Tube type: Oral Tube size: 7.5 mm Number of attempts: 1 Airway Equipment and Method: Stylet Placement Confirmation: ETT inserted through vocal cords under direct vision, positive ETCO2 and breath sounds checked- equal and bilateral Secured at: 22 cm Tube secured with: Tape Dental Injury: Teeth and Oropharynx as per pre-operative assessment

## 2022-08-13 NOTE — Anesthesia Postprocedure Evaluation (Signed)
Anesthesia Post Note  Patient: Devon Sanchez  Procedure(s) Performed: XI ROBOTIC ASSISTED LAPAROSCOPIC RADICAL PROSTATECTOMY LEVEL 2 BILATERAL PELVIC LYMPHADENECTOMY (Bilateral)     Patient location during evaluation: PACU Anesthesia Type: General Level of consciousness: awake and alert Pain management: pain level controlled Vital Signs Assessment: post-procedure vital signs reviewed and stable Respiratory status: spontaneous breathing, nonlabored ventilation, respiratory function stable and patient connected to nasal cannula oxygen Cardiovascular status: blood pressure returned to baseline and stable Postop Assessment: no apparent nausea or vomiting Anesthetic complications: no  No notable events documented.  Last Vitals:  Vitals:   08/13/22 1150 08/13/22 1200  BP:  124/72  Pulse: 78 73  Resp: 12 16  Temp:    SpO2: 95% 92%    Last Pain:  Vitals:   08/13/22 1222  TempSrc:   PainSc: 3                  Helder Crisafulli S

## 2022-08-14 ENCOUNTER — Encounter (HOSPITAL_COMMUNITY): Payer: Self-pay | Admitting: Urology

## 2022-08-14 DIAGNOSIS — Z955 Presence of coronary angioplasty implant and graft: Secondary | ICD-10-CM | POA: Diagnosis not present

## 2022-08-14 DIAGNOSIS — Z79899 Other long term (current) drug therapy: Secondary | ICD-10-CM | POA: Diagnosis not present

## 2022-08-14 DIAGNOSIS — C61 Malignant neoplasm of prostate: Secondary | ICD-10-CM | POA: Diagnosis not present

## 2022-08-14 DIAGNOSIS — Z7982 Long term (current) use of aspirin: Secondary | ICD-10-CM | POA: Diagnosis not present

## 2022-08-14 DIAGNOSIS — I2510 Atherosclerotic heart disease of native coronary artery without angina pectoris: Secondary | ICD-10-CM | POA: Diagnosis not present

## 2022-08-14 LAB — CREATININE, SERUM
Creatinine, Ser: 1.82 mg/dL — ABNORMAL HIGH (ref 0.61–1.24)
GFR, Estimated: 39 mL/min — ABNORMAL LOW (ref >60)

## 2022-08-14 LAB — HEMOGLOBIN AND HEMATOCRIT, BLOOD
HCT: 31.5 % — ABNORMAL LOW (ref 39.0–52.0)
Hemoglobin: 11 g/dL — ABNORMAL LOW (ref 13.0–17.0)

## 2022-08-14 LAB — CREATININE, FLUID (PLEURAL, PERITONEAL, JP DRAINAGE): Creat, Fluid: 1.9 mg/dL

## 2022-08-14 MED ORDER — TRAMADOL HCL 50 MG PO TABS
50.0000 mg | ORAL_TABLET | Freq: Four times a day (QID) | ORAL | Status: DC | PRN
  Administered 2022-08-14: 50 mg via ORAL
  Filled 2022-08-14: qty 1

## 2022-08-14 MED ORDER — BISACODYL 10 MG RE SUPP
10.0000 mg | Freq: Once | RECTAL | Status: AC
  Administered 2022-08-14: 10 mg via RECTAL
  Filled 2022-08-14: qty 1

## 2022-08-14 MED ORDER — CHLORHEXIDINE GLUCONATE CLOTH 2 % EX PADS
6.0000 | MEDICATED_PAD | Freq: Every day | CUTANEOUS | Status: DC
  Administered 2022-08-14: 6 via TOPICAL

## 2022-08-14 MED ORDER — ACETAMINOPHEN 500 MG PO TABS
1000.0000 mg | ORAL_TABLET | Freq: Three times a day (TID) | ORAL | Status: DC
  Filled 2022-08-14: qty 2

## 2022-08-14 MED ORDER — TRAMADOL HCL 50 MG PO TABS: 100.0000 mg | ORAL_TABLET | Freq: Four times a day (QID) | ORAL | Status: DC | PRN

## 2022-08-14 MED ORDER — MORPHINE SULFATE (PF) 2 MG/ML IV SOLN: 2.0000 mg | INTRAVENOUS | Status: DC | PRN

## 2022-08-14 NOTE — Discharge Summary (Signed)
Date of admission: 08/13/2022  Date of discharge: 08/14/2022  Admission diagnosis: Prostate Cancer  Discharge diagnosis: Prostate Cancer  History and Physical: For full details, please see admission history and physical. Briefly, Devon Sanchez is a 72 y.o. gentleman with localized prostate cancer.  After discussing management/treatment options, he elected to proceed with surgical treatment.  Hospital Course: ERASTUS NICASIO was taken to the operating room on 08/13/2022 and underwent a robotic assisted laparoscopic radical prostatectomy. He tolerated this procedure well and without complications. Postoperatively, he was able to be transferred to a regular hospital room following recovery from anesthesia.  He was able to begin ambulating the night of surgery. He remained hemodynamically stable overnight.  He had excellent urine output with appropriately minimal output from his pelvic drain and his pelvic drain was removed on POD #1.  He was transitioned to oral pain medication, tolerated a clear liquid diet, and had met all discharge criteria and was able to be discharged home later on POD#1.  Laboratory values:  Recent Labs    08/13/22 1140 08/14/22 0445  HGB 12.1* 11.0*  HCT 35.4* 31.5*    Disposition: Home  Discharge instruction: He was instructed to be ambulatory but to refrain from heavy lifting, strenuous activity, or driving. He was instructed on urethral catheter care.  Discharge medications:   Allergies as of 08/14/2022       Reactions   Bupropion Other (See Comments)   MADE CHEST HURT   Naproxen Sodium Other (See Comments)   GI BLEEDING        Medication List     STOP taking these medications    CENTRUM SILVER PO   Fish Oil 1200 MG Caps       TAKE these medications    acetaminophen 500 MG tablet Commonly known as: TYLENOL Take 500 mg by mouth every 6 (six) hours as needed for moderate pain or headache.   aspirin EC 325 MG tablet Take 1 tablet (325 mg  total) by mouth daily.   carvedilol 6.25 MG tablet Commonly known as: COREG TAKE 1 TABLET BY MOUTH TWICE  DAILY WITH A MEAL   docusate sodium 100 MG capsule Commonly known as: COLACE Take 1 capsule (100 mg total) by mouth 2 (two) times daily.   escitalopram 20 MG tablet Commonly known as: LEXAPRO Take 20 mg by mouth daily. What changed: Another medication with the same name was removed. Continue taking this medication, and follow the directions you see here.   losartan 25 MG tablet Commonly known as: COZAAR Take 1 tablet (25 mg total) by mouth every evening.   nitroGLYCERIN 0.4 MG SL tablet Commonly known as: NITROSTAT Place 1 tablet (0.4 mg total) under the tongue every 5 (five) minutes as needed for up to 25 days for chest pain. What changed: additional instructions   omeprazole 20 MG capsule Commonly known as: PRILOSEC Take 20 mg by mouth daily.   polyethylene glycol 17 g packet Commonly known as: MIRALAX / GLYCOLAX Take 17 g by mouth daily.   rosuvastatin 20 MG tablet Commonly known as: CRESTOR Take 1 tablet (20 mg total) by mouth daily.   sulfamethoxazole-trimethoprim 800-160 MG tablet Commonly known as: BACTRIM DS Take 1 tablet by mouth 2 (two) times daily. Start the day prior to foley removal appointment   traMADol 50 MG tablet Commonly known as: Ultram Take 1-2 tablets (50-100 mg total) by mouth every 6 (six) hours as needed for moderate pain or severe pain.  Followup: He will followup in 1 week for catheter removal and to discuss his surgical pathology results.

## 2022-08-14 NOTE — Progress Notes (Signed)
Urology Progress Note   1 Day Post-Op s/p RALP, bilateral PLND  Subjective: No acute events overnight. Pain controlled with PRNs. Tolerating clear liquids. Making adequate urine via foley. Hgb 11.0. 190cc from JP  Objective: Vital signs in last 24 hours: Temp:  [97.7 F (36.5 C)-98.9 F (37.2 C)] 98.1 F (36.7 C) (03/12 0500) Pulse Rate:  [56-86] 56 (03/12 0500) Resp:  [11-18] 18 (03/11 2014) BP: (105-142)/(69-103) 105/69 (03/12 0500) SpO2:  [87 %-95 %] 95 % (03/12 0500)  Intake/Output from previous day: 03/11 0701 - 03/12 0700 In: 6136.2 [P.O.:700; I.V.:3986.2; IV Piggyback:1450] Out: 2030 [id number]; Drains:190; Blood:150] Intake/Output this shift: No intake/output data recorded.  Physical Exam:  General: Alert and oriented CV: Regular rate Lungs: Normal work of breathing Abdomen: Soft, appropriately tender, non-distended. Incisions c/d/i GU: Foley in place draining clear yellow urine  Ext: NT, No erythema  Lab Results: Recent Labs    08/13/22 1140 08/14/22 0445  HGB 12.1* 11.0*  HCT 35.4* 31.5*   BMET No results for input(s): "NA", "K", "CL", "CO2", "GLUCOSE", "BUN", "CREATININE", "CALCIUM" in the last 72 hours.   Studies/Results: No results found.  Assessment/Plan:  72 y.o. male s/p RALP and bilateral PLND on 08/13/22. Recovering well    - Sch Tylenol, PRN narcotic medication - Home meds - Med[place] - Clear liquid diet - Ambulate, OOB, IS - JP Cr   Dispo: floor status   LOS: 0 days   Vyla Pint Jenavive Lamboy 08/14/2022, 7:25 AM

## 2022-08-18 LAB — SURGICAL PATHOLOGY

## 2022-09-11 DIAGNOSIS — M62838 Other muscle spasm: Secondary | ICD-10-CM | POA: Diagnosis not present

## 2022-09-11 DIAGNOSIS — M6281 Muscle weakness (generalized): Secondary | ICD-10-CM | POA: Diagnosis not present

## 2022-09-21 DIAGNOSIS — M6281 Muscle weakness (generalized): Secondary | ICD-10-CM | POA: Diagnosis not present

## 2022-09-21 DIAGNOSIS — M62838 Other muscle spasm: Secondary | ICD-10-CM | POA: Diagnosis not present

## 2022-11-07 DIAGNOSIS — M6281 Muscle weakness (generalized): Secondary | ICD-10-CM | POA: Diagnosis not present

## 2022-11-07 DIAGNOSIS — M62838 Other muscle spasm: Secondary | ICD-10-CM | POA: Diagnosis not present

## 2022-12-10 ENCOUNTER — Other Ambulatory Visit: Payer: Self-pay | Admitting: Cardiology

## 2022-12-10 DIAGNOSIS — R7303 Prediabetes: Secondary | ICD-10-CM | POA: Diagnosis not present

## 2022-12-10 DIAGNOSIS — E782 Mixed hyperlipidemia: Secondary | ICD-10-CM | POA: Diagnosis not present

## 2022-12-17 DIAGNOSIS — I6522 Occlusion and stenosis of left carotid artery: Secondary | ICD-10-CM | POA: Diagnosis not present

## 2022-12-17 DIAGNOSIS — D509 Iron deficiency anemia, unspecified: Secondary | ICD-10-CM | POA: Diagnosis not present

## 2022-12-17 DIAGNOSIS — G4733 Obstructive sleep apnea (adult) (pediatric): Secondary | ICD-10-CM | POA: Diagnosis not present

## 2022-12-17 DIAGNOSIS — E782 Mixed hyperlipidemia: Secondary | ICD-10-CM | POA: Diagnosis not present

## 2022-12-17 DIAGNOSIS — R7303 Prediabetes: Secondary | ICD-10-CM | POA: Diagnosis not present

## 2022-12-17 DIAGNOSIS — N182 Chronic kidney disease, stage 2 (mild): Secondary | ICD-10-CM | POA: Diagnosis not present

## 2022-12-17 DIAGNOSIS — I129 Hypertensive chronic kidney disease with stage 1 through stage 4 chronic kidney disease, or unspecified chronic kidney disease: Secondary | ICD-10-CM | POA: Diagnosis not present

## 2022-12-17 DIAGNOSIS — I712 Thoracic aortic aneurysm, without rupture, unspecified: Secondary | ICD-10-CM | POA: Diagnosis not present

## 2022-12-17 DIAGNOSIS — E785 Hyperlipidemia, unspecified: Secondary | ICD-10-CM | POA: Diagnosis not present

## 2022-12-17 DIAGNOSIS — R5383 Other fatigue: Secondary | ICD-10-CM | POA: Diagnosis not present

## 2022-12-17 DIAGNOSIS — I2510 Atherosclerotic heart disease of native coronary artery without angina pectoris: Secondary | ICD-10-CM | POA: Diagnosis not present

## 2022-12-27 ENCOUNTER — Other Ambulatory Visit: Payer: Self-pay | Admitting: Cardiology

## 2022-12-27 DIAGNOSIS — Z952 Presence of prosthetic heart valve: Secondary | ICD-10-CM

## 2023-01-08 DIAGNOSIS — M6281 Muscle weakness (generalized): Secondary | ICD-10-CM | POA: Diagnosis not present

## 2023-01-08 DIAGNOSIS — M62838 Other muscle spasm: Secondary | ICD-10-CM | POA: Diagnosis not present

## 2023-01-31 DIAGNOSIS — Z9842 Cataract extraction status, left eye: Secondary | ICD-10-CM | POA: Diagnosis not present

## 2023-01-31 DIAGNOSIS — H52223 Regular astigmatism, bilateral: Secondary | ICD-10-CM | POA: Diagnosis not present

## 2023-01-31 DIAGNOSIS — Z9841 Cataract extraction status, right eye: Secondary | ICD-10-CM | POA: Diagnosis not present

## 2023-04-03 ENCOUNTER — Ambulatory Visit: Payer: Medicare Other | Admitting: Cardiology

## 2023-04-10 DIAGNOSIS — M6281 Muscle weakness (generalized): Secondary | ICD-10-CM | POA: Diagnosis not present

## 2023-04-10 DIAGNOSIS — M62838 Other muscle spasm: Secondary | ICD-10-CM | POA: Diagnosis not present

## 2023-05-10 ENCOUNTER — Encounter: Payer: Self-pay | Admitting: Cardiology

## 2023-05-13 NOTE — Telephone Encounter (Signed)
Attempted to contact patient, wanted to ensure weight gain and/or "short winded" wasn't fluid related. Upon review, it looks like he has discussed weight loss options in the past with Dr Jacinto Halim (referral to [facility]).  Patient has appointment with Dr Jacinto Halim scheduled on 05/14/23

## 2023-05-14 ENCOUNTER — Ambulatory Visit: Payer: Medicare Other | Attending: Cardiology | Admitting: Cardiology

## 2023-05-14 ENCOUNTER — Encounter: Payer: Self-pay | Admitting: Cardiology

## 2023-05-14 VITALS — BP 124/80 | HR 76 | Resp 16 | Ht 74.0 in | Wt 265.0 lb

## 2023-05-14 DIAGNOSIS — Z952 Presence of prosthetic heart valve: Secondary | ICD-10-CM

## 2023-05-14 DIAGNOSIS — I2510 Atherosclerotic heart disease of native coronary artery without angina pectoris: Secondary | ICD-10-CM

## 2023-05-14 DIAGNOSIS — I10 Essential (primary) hypertension: Secondary | ICD-10-CM | POA: Diagnosis not present

## 2023-05-14 DIAGNOSIS — E66811 Obesity, class 1: Secondary | ICD-10-CM

## 2023-05-14 DIAGNOSIS — Z6834 Body mass index (BMI) 34.0-34.9, adult: Secondary | ICD-10-CM

## 2023-05-14 DIAGNOSIS — R0609 Other forms of dyspnea: Secondary | ICD-10-CM | POA: Diagnosis not present

## 2023-05-14 DIAGNOSIS — E6609 Other obesity due to excess calories: Secondary | ICD-10-CM

## 2023-05-14 NOTE — Progress Notes (Signed)
Cardiology Office Note:  .   Date:  05/14/2023  ID:  Devon Sanchez, DOB Mar 22, 1951, MRN [id number] PCP: Benita Stabile, MD  [facility] Providers Cardiologist:  Yates Decamp, MD   History of Present Illness: .   Devon Sanchez is a 72 y.o. Caucasian male  with CAD S/P Prox LAD stent in 2007 and again  on 04/20/2013 underwent stenting to the proximal and mid LAD and proximal and mid circumflex. 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   Discussed the use of AI scribe software for clinical note transcription with the patient, who gave verbal consent to proceed.  History of Present Illness   The patient, with a history of prostate cancer, presents with significant weight gain of approximately 55-60 pounds over the past year. He associates this weight gain with shortness of breath. The patient has been under considerable stress due to multiple family issues, including two deaths and a daughter's nervous breakdown. He expresses concern that the weight gain could lead to diabetes, a condition prevalent in his family. The patient denies experiencing chest pain or nocturnal dyspnea. He underwent prostate cancer surgery in March of the previous year and is due for a follow-up with the surgeon in January.      Review of Systems  Constitutional: Positive for weight gain.  Cardiovascular:  Positive for dyspnea on exertion. Negative for chest pain and leg swelling.  Psychiatric/Behavioral:  Positive for depression.     Labs   External Labs:  Labs 12/10/2022:  Hb 13.5/HCT 39.8, platelets 196.  Serum glucose 105 mg, BUN 20, creatinine 1.10, EGFR 72 mL, potassium 4.4, LFTs normal.  Total cholesterol 166, triglycerides 226, HDL 38, LDL 90.  A1c  6.1%.  Physical Exam:   VS:  BP 124/80 (BP Location: Left Arm, Patient Position: Sitting, Cuff Size: Large)   Pulse 76   Resp 16   Ht 6\' 2"  (1.88 m)   Wt 265 lb (120.2 kg)   SpO2 96%   BMI 34.02 kg/m    Wt Readings from Last 3 Encounters:  05/14/23 265 lb (120.2 kg)  08/13/22 247 lb (112 kg)  08/07/22 247 lb (112 kg)     Physical Exam Constitutional:      Appearance: He is obese.  Neck:     Vascular: No carotid bruit or JVD.  Cardiovascular:     Rate and Rhythm: Normal rate and regular rhythm.     Pulses: Intact distal pulses.     Heart sounds: Normal heart sounds. No murmur heard.    No gallop.  Pulmonary:     Effort: Pulmonary effort is normal.     Breath sounds: Normal breath sounds.  Abdominal:     General: Bowel sounds are normal.     Palpations: Abdomen is soft.  Musculoskeletal:     Right lower leg: No edema.     Left lower leg: No edema.     Studies Reviewed: Marland Kitchen    Coronary Angiogram 11/11/2019: LM: Normal LAD: Patent prox LAD 4.0 x 28 mm Promus stent with 20% late lumen loss at proximal edge LCx: Patent prox Cx 4.0 x 20 mm Promus stent  Ramus: Normal RCA: 95% stenosis in small RV marginal branch, unchanged compared to previous angiogram in 2014  S/P AVR (aortic valve replacement) and aortoplasty-Bentall procedure 05/12/2020:  25 mm Bovine valve and 30 mm Hemashield gold graft   ECHOCARDIOGRAM COMPLETE 06/29/2021 Normal LV systolic function with visual EF 60-65%. Left ventricle cavity is normal in size. Normal left ventricular wall thickness. Normal global wall motion. Normal diastolic filling pattern, normal LAP. S/P Bentall procedure  Bioprosthetic valve is well-seated, no evidence of dehiscence, and no significant perivalvular regurgitation. No evidence of valvular stenosis or regurgitation. Mild pulmonic regurgitation. S/P Bentall procedure - aortic root and proximal ascending aorta are mildly dilated 39mm. Compared to study 07/14/2020 LVEF remains  preserved, G1DD is now normal, Ao root was 35mm and now 39mm.  EKG:   EKG 05/14/2023: Normal sinus rhythm at the rate of 74 bpm, low-voltage complexes, rightward axis, right bundle branch block.  Compared to 04/02/2022, PVC not present.  Medications and allergies    Allergies  Allergen Reactions   Bupropion Other (See Comments)    MADE CHEST HURT   Naproxen Sodium Other (See Comments)    GI BLEEDING     Current Outpatient Medications:    acetaminophen (TYLENOL) 500 MG tablet, Take 500 mg by mouth every 6 (six) hours as needed for moderate pain or headache. , Disp: , Rfl:    aspirin EC 325 MG EC tablet, Take 1 tablet (325 mg total) by mouth daily., Disp: , Rfl:    carvedilol (COREG) 6.25 MG tablet, TAKE 1 TABLET BY MOUTH TWICE  DAILY WITH A MEAL, Disp: 200 tablet, Rfl: 2   docusate sodium (COLACE) 100 MG capsule, Take 1 capsule (100 mg total) by mouth 2 (two) times daily., Disp: , Rfl:    escitalopram (LEXAPRO) 20 MG tablet, Take 20 mg by mouth daily., Disp: , Rfl:    losartan (COZAAR) 25 MG tablet, TAKE 1 TABLET BY MOUTH IN THE  EVENING, Disp: 100 tablet, Rfl: 2   nitroGLYCERIN (NITROSTAT) 0.4 MG SL tablet, Place 1 tablet (0.4 mg total) under the tongue every 5 (five) minutes as needed for up to 25 days for chest pain. (Patient taking differently: Place 0.4 mg under the tongue every 5 (five) minutes as needed for chest pain. Pt hasn't used in ~10 years, but keeps it on hand in case), Disp: 25 tablet, Rfl: 3   omeprazole (PRILOSEC) 20 MG capsule, Take 20 mg by mouth daily., Disp: , Rfl:    polyethylene glycol (MIRALAX / GLYCOLAX) 17 g packet, Take 17 g by mouth daily., Disp: , Rfl:    rosuvastatin (CRESTOR) 20 MG tablet, TAKE 1 TABLET BY MOUTH DAILY, Disp: 100 tablet, Rfl: 2   sulfamethoxazole-trimethoprim (BACTRIM DS) 800-160 MG tablet, Take 1 tablet by mouth 2 (two) times daily. Start the day prior to foley removal appointment (Patient not taking: Reported on 05/14/2023), Disp: 6 tablet,  Rfl: 0   ASSESSMENT AND PLAN: .      ICD-10-CM   1. Coronary artery disease involving native coronary artery of native heart without angina pectoris  I25.10 Referral to HRT/VAS Care Navigation    EKG 12-Lead    EKG 12-Lead    2. S/P AVR (aortic valve replacement) and aortoplasty  Z95.2 Referral to HRT/VAS Care Navigation    3. Dyspnea on exertion  R06.09     4. Essential hypertension  I10     5. Class 1 obesity due to excess calories with serious comorbidity and body mass index (BMI) of 34.0 to 34.9 in adult  E66.811 Referral to HRT/VAS Care Navigation   [phone]  Z68.34       1. Coronary artery disease involving native coronary artery of native heart without angina pectoris No new symptoms reported. EKG shows normal sinus rhythm, no significant changes from previous EKG. -Continue current management.  2. S/P AVR (aortic valve replacement) and aortoplasty Stable echo in Jan 2023, no clinical e/o CHF and no change in EKG or physical exam.   3. Dyspnea on exertion Recent significant weight gain. No clinical evidence of CHF. No change in EKG.   4. Essential hypertension Well controlled on carvedilol 6.25 mg twice daily and losartan 25 mg daily.    Weight Gain Significant weight gain of 55-60 pounds over the past year. No signs of heart failure on examination. Shortness of breath likely related to weight gain. Patient has expressed concern about potential development of diabetes. -[facility]. -Encouraged patient to consider lifestyle modifications for weight loss.  Prostate Cancer Status post prostate cancer removal in March. Follow-up with surgeon scheduled for January. -No changes to current management plan.  Psychosocial Stressors Multiple family stressors including recent deaths and daughter's mental health issues. -Encouraged patient to seek support as needed.    Signed,  Yates Decamp, MD, [facility] 05/14/2023, 9:15 PM [facility]  [facility] [address] [place], [place] [address] Phone: [phone]. Fax:  [phone]

## 2023-05-14 NOTE — Patient Instructions (Signed)
Medication Instructions:  Your physician recommends that you continue on your current medications as directed. Please refer to the Current Medication list given to you today.  *If you need a refill on your cardiac medications before your next appointment, please call your pharmacy*   Lab Work: none If you have labs (blood work) drawn today and your tests are completely normal, you will receive your results only by: MyChart Message (if you have MyChart) OR A paper copy in the mail If you have any lab test that is abnormal or we need to change your treatment, we will call you to review the results.   Testing/Procedures: none   Follow-Up: At [facility], you and your health needs are our priority.  As part of our continuing mission to provide you with exceptional heart care, we have created designated Provider Care Teams.  These Care Teams include your primary Cardiologist (physician) and Advanced Practice Providers (APPs -  Physician Assistants and Nurse Practitioners) who all work together to provide you with the care you need, when you need it.  We recommend signing up for the patient portal called "MyChart".  Sign up information is provided on this After Visit Summary.  MyChart is used to connect with patients for Virtual Visits (Telemedicine).  Patients are able to view lab/test results, encounter notes, upcoming appointments, etc.  Non-urgent messages can be sent to your provider as well.   To learn more about what you can do with MyChart, go to [email or web address].    Your next appointment:   12 month(s)  Provider:   Yates Decamp, MD     Other Instruction  You have been referred to see Renaee Munda for care navigation

## 2023-05-15 ENCOUNTER — Telehealth: Payer: Self-pay

## 2023-05-15 DIAGNOSIS — Z0000 Encounter for general adult medical examination without abnormal findings: Secondary | ICD-10-CM

## 2023-05-15 NOTE — Telephone Encounter (Signed)
Called patient to discuss health coaching referral for health eating. Patient stated that he was interested in participating in health coaching. Patient has been scheduled for his initial in-person session on 12/13 at 12:30pm.   Renaee Munda, MS, ERHD, [facility]  Care Guide, Health & Wellness Coach [address] [place] [address] Telephone: [phone] Email: [email or web address]

## 2023-05-17 ENCOUNTER — Ambulatory Visit: Payer: Medicare Other | Attending: Cardiology

## 2023-05-17 DIAGNOSIS — Z0000 Encounter for general adult medical examination without abnormal findings: Secondary | ICD-10-CM

## 2023-05-17 NOTE — Progress Notes (Signed)
HEALTH & WELLNESS COACHING INITIAL INTAKE   Appointment Outcome: Completed, Session #:  Initial                       Start time: 12:32pm   End time: 1:47pm   Total Mins: 75 minutes   What are the Patient's goals from Coaching? Patient wants to work towards losing approximately 30 lbs. over the next 3 months by implementing healthy eating habits and increasing physical activity.   Why did they seek coaching now? Patient has been able to lose weight previously by implementing healthy eating/physical activity but had difficulty maintaining his progress after encountering various stressors. Patient is looking for support and accountability to aid in revamping his approach to living a healthier lifestyle.  Readiness - What stage is the patient in regarding their goal(s)? Patient is in the preparation stage of changing his eating habits and physical activity behavior to lose weight over the next three months.    Coaching Progress Notes: Patient reported that he would like to work on decreasing his snacking. Patient mentioned that him and his wife eats fast food for breakfast.  Patient explained the lost of a close individual that impacted his eating behavior. Patient stated that if he can get his weight under control, he would like to lose a total of 40-50 lbs. Patient stated that stress was a trigger for him to eat more. Patient expressed that he has lost weight previous due to exercise at the [facility].  Patient mentioned that he walks 1/4 mile daily that takes him about 12-15 minutes round trip. Patient stated that he walks about 4,000 steps per day according to his Fitbit. Patient shared that when he was working, he would walk on his lunch break during the time he was able to lose 50 lbs. Patient mentioned that after grieving the loss of someone close, he would go out to eat with his daughters, or his oldest daughter would cook and he began to gain the weight back.  Patient stated that during the  time he was losing weight, he was tracking his macronutrients with the Fitbit app daily. Discussed with patient his current goals in addition to reducing his snacking to determine how to help patient co-create an action plan. Patient shared that he was not monitoring his sodium/sugar intake when snacking. Discussed with patient the recommendation by the American Heart Association of 1,500mg  of sodium per day and 38 grams of added sugar per day for men.   Discussed with patient protein/snack ideas per handout, practicing portion control and reading food labels for serving size and nutritional breakdown. Patient determined that he would refrain from getting a second plate of food during meals. Patient stated that he doesn't want to eat dinner after 7pm. Patient stated that he plans to join the [employer] at the beginning of next year, but in the meantime, patient is interested in taking walks with his wife.    Coaching Outcomes Patient co-created agreement to implement over the next two weeks as outlined below.     AGREEMENTS SECTION   Overall Goal(s): Lose approximately 30 lbs. over the course of 3 months by improving healthy eating habits and increasing exercise to 150 minutes per week.                Patient will track his weight loss over time for measurement against his weight loss goal.  Agreement/Action Steps:  Improve healthy eating habits Read food labels to monitor sodium intake Aim for 1500mg  of sodium per day Read food labels to monitor daily added sugar intake Aim for 38 grams of added sugar per day Practice portion Read food label for recommended serving size Utilize the diabetes plate method Refrain from eating seconds Drink water after eating Refrain from eating/snacking after 7pm Track nutrition with Fitbit App  Increase exercise to 150 minutes per week Start walking 15 minutes per day with wife outdoors Exercise while following a YouTube video for 15-30  minutes Incorporate stretch bands   Patient review of Health Coaching Agreement Reviewed Coaching Agreement and Code of Ethics with Patient during initial session. Answered any questions the patient had if any regarding the Coaching Agreement and Code of Ethics. Patient verbally agreed to adhere to the Coaching Agreement and to abide by the Code of Ethics.  Mailed patient with a hard/electronic copy of the Coaching Agreement and Code of Ethics.  Referrals: N/A  Resources: Protein/Carb snack idea sheet, how to practice portion control, and how to read food labels.

## 2023-05-31 ENCOUNTER — Ambulatory Visit: Payer: Medicare Other | Attending: Cardiovascular Disease

## 2023-05-31 VITALS — Ht 74.0 in | Wt 252.0 lb

## 2023-05-31 DIAGNOSIS — Z0000 Encounter for general adult medical examination without abnormal findings: Secondary | ICD-10-CM

## 2023-05-31 NOTE — Progress Notes (Unsigned)
Appointment Outcome: Completed, Session #: 1                        Start time: 12:50pm   End time: 1:29pm   Total Mins: 39 minutes  AGREEMENTS SECTION   Overall Goal(s): Lose approximately 30 lbs. over the course of 3 months by improving healthy eating habits and increasing exercise to 150 minutes per week.                 Patient will track his weight loss over time for measurement against his weight loss goal.                       Agreement/Action Steps:  Improve healthy eating habits Read food labels to monitor sodium intake Aim for 1500mg  of sodium per day Read food labels to monitor daily added sugar intake Aim for 38 grams of added sugar per day Practice portion Read food label for recommended serving size Utilize the diabetes plate method Refrain from eating seconds Drink water after eating Refrain from eating/snacking after 7pm Track nutrition with Fitbit App  Increase exercise to 150 minutes per week Start walking 15 minutes per day with wife outdoors Exercise while following a YouTube video for 15-30 minutes Incorporate stretch bands    Progress Notes:  Patient arrived with his wife for support with health coaching. Patient expressed that he still has a challenge with eating/snacking after 7pm. Patient mentioned that they typically eat breakfast as a late lunch and have dinner around 6pm. Patient's wife shared that she tries to encourage him not to eat late at night and that she had purchased fruit/vegetables for him to snack on. She stated that she also purchases food products that are low in sodium, or no salt added. Patient's wife discussed that she does purchase Healthy Choice meals and pick the ones that contain the least amount of sodium.   Patient reported that he has lost a total of 8 lbs. over the course of two weeks. Patient explained that he had not walked with his wife due to other responsibilities and not taking advantage of the daylight. Patient mentioned  that he was physically active over the past two weeks with raking leaves and other yard chores. Patient shared that on one day he burned 3400 calories. Patient stated that he has noticed that he has more energy after the first week. Patient and his wife stated that they have discussed going back to the [facility] at the beginning of the year   Patient stated that he had originally lost a total of 13 lbs. prior to Christmas. Patient explained that he realized that he was eating more foods that were high in carbohydrates than high in protein. Patient and his wife stated that they have made changes with what food choices they are making. Patient stated that he has found a low carbohydrate meal with eggs and bacon at a restaurant he visits but has also cut back on eating out altogether.   Patient shared that using the Fitbit app to track his meals and physical activity has been helpful to holding him accountable. Patient expressed that he must mentally get into a space where he is fully motivated but has noticed an increase in his motivation because he has seen results from implementing his action steps.  Indicators of Success and Accountability: Patient reported a weight loss of 8 lbs.  Readiness: Patient is in the action stage of  improving healthy eating habits and increasing exercise to work towards weight loss.  Strengths and Supports: Patient is being supported by his wife. Patient is relying on knowledge from previous experiences from losing weight.   Challenges and Barriers: Patient challenge is refraining from snacking at 7pm.   Coaching Outcomes: Reviewed health coaching documents to support patient's action plan to ensure that patient still want to focus on the current action steps.   Discussed meal/snack planning with patient and his wife to help him implement healthy eating on a schedule and reduce snacking. Patient will focus on refraining from eating/snacking after 7pm.  Discussed with  patient how reading food labels for himself can be beneficial in addition to his wife reading food labels. Patient stated he will assist his wife with meal/snack planning.  Patient stated that he wants to focus on his eating at this time and will revisit implementing exercising at the [facility] at the start of the new year. Patient and his wife plan to exercise at the gym 2-3 days per week that will include walking on the treadmill, weights, and various muscle group exercises.    Attempted: Fulfilled - Patient is using his Fitbit app to track his progress. Patient has read food labels to monitor his sugar and sodium intake. Patient is practicing portion control.  Partial - Patient did not implement the outlined action steps to increase his exercise but was able to engage in yard work (e.g., raking leaves) over the past two weeks. Patient stated that he did drink water after eating, but not on a regular basis.   Not met - Patient continues to snack after 7pm.

## 2023-06-14 ENCOUNTER — Ambulatory Visit: Payer: Medicare Other | Attending: Cardiology

## 2023-06-14 DIAGNOSIS — Z0000 Encounter for general adult medical examination without abnormal findings: Secondary | ICD-10-CM

## 2023-06-14 NOTE — Progress Notes (Signed)
 Appointment Outcome: Completed, Session #: 2                        Start time: 1:00pm   End time: 1:33pm   Total Mins: 33 minutes  AGREEMENTS SECTION   Overall Goal(s): Lose approximately 30 lbs. over the course of 3 months by improving healthy eating habits.   Patient will track his weight loss over time for measurement against his weight loss goal.                       Agreement/Action Steps:  Improve healthy eating habits Read food labels to monitor sodium intake Aim for 1500mg  of sodium per day Read food labels to monitor daily added sugar intake Aim for 38 grams of added sugar per day Practice portion Read food label for recommended serving size Utilize the diabetes plate method Refrain from eating seconds Drink water  after eating Refrain from eating/snacking after 7pm Track nutrition with Fitbit App    Progress Notes:  Patient stated that he did okay with improving his eating habits but continues to have a challenge with refraining from snacking after 7pm. Patient reported that he currently weigh 250 lbs. Patient gave examples of some of the snack options that he consumed recently and read the food label. Patient stated that he did not read the label on the donuts prior to eating them.  Patient stated that he has considered drinking a protein shake and have veggies as a snack as opposed to sweets. Patient shared that he has increased his vegetable intake for dinner by steaming cauliflower, carrots, and broccoli. Patient shared that he continues to eat the low carb platter from [employer]. Discussed with patient the amount of sodium that is included in the meal via the restaurant's website.  Patient mentioned that he eats one Healthy Choice meal for dinner at times. Patient read the food label during the session to determine how much sodium is included in those meals. Patient shared how there was a discrepancy with tracking his food via his Fitbit because it is giving  different values that the food label. Patient discussed how he was successful in losing weight previously. Patient stated that he was able to offset his caloric intake by exercising after work by walking on the treadmill and was following a strict diet by feel that it is unrealistic to sustain.   Patient shared that he has not been drinking water  after eating because he continues to forget. Patient stated that this is an important step to him that he wants to continue to work on increasing his water  intake. Patient believes that he is in a good place getting started and will continue to build on what he has already started implementing.    Indicators of Success and Accountability:  Patient reported that he currently weighs 250 lbs.  Readiness: Patient is in the action stage of improving healthy eating habits. Patient is in the preparation stage of increasing exercise.   Strengths and Supports: Patient is being supported by his wife. Patient is relying on knowledge from previous weight loss experience.   Challenges and Barriers: Patient stated that he still has a challenge with snacking after 7pm.  Coaching Outcomes: Patient stated that he is going to work on his sleep schedule by going to bed by 10-11pm instead 1-2am to help decrease the urge to snack late at night, eat on a more consistent schedule during the day, and include  an exercise routine over the next two weeks.   Patient will continue to implement current action steps to improve his healthy eating habits. Patient shared that him and his wife will be discussing meal planning to help with improving his eating habits (e.g., reduce sodium and added sugar intake).    Attempted: Fulfilled - Patient has continued to use his Fitbit to track his meals, monitor is sodium and added sugar intake. Patient continues to practice portion control with eating only one serving.   Partial - Patient is not reading food labels prior to eating on a  consistent basis.   Not met - Patient continues to have a challenge with snacking after 7pm. Patient has not begun to drink water  after eating as planned.

## 2023-06-17 DIAGNOSIS — R7303 Prediabetes: Secondary | ICD-10-CM | POA: Diagnosis not present

## 2023-06-17 DIAGNOSIS — E782 Mixed hyperlipidemia: Secondary | ICD-10-CM | POA: Diagnosis not present

## 2023-06-18 LAB — LAB REPORT - SCANNED
A1c: 6
Albumin, Urine POC: 21.8
Creatinine, POC: 298.8 mg/dL
EGFR: 62
Microalb Creat Ratio: 7

## 2023-06-21 ENCOUNTER — Telehealth: Payer: Self-pay

## 2023-06-21 DIAGNOSIS — Z0000 Encounter for general adult medical examination without abnormal findings: Secondary | ICD-10-CM

## 2023-06-21 NOTE — Telephone Encounter (Signed)
Patient called in to reschedule health coaching appt due to having a urology appt scheduled the same time on 1/24 at 1:00pm. Patient preferred an in-person visit and has been scheduled for 1/31 at 1:00pm.   Renaee Munda, MS, ERHD, [facility]  Care Guide, Health & Wellness Coach [address] [place] [address] Telephone: [phone] Email: [email or web address]

## 2023-06-28 ENCOUNTER — Ambulatory Visit: Payer: Medicare Other

## 2023-07-05 ENCOUNTER — Ambulatory Visit: Payer: Medicare Other | Attending: Cardiology

## 2023-07-05 VITALS — Wt 250.0 lb

## 2023-07-05 DIAGNOSIS — Z0000 Encounter for general adult medical examination without abnormal findings: Secondary | ICD-10-CM

## 2023-07-05 NOTE — Progress Notes (Signed)
 Appointment Outcome:  Completed, Session #: 3                        Start time: 12:50pm   End time: 1:28pm   Total Mins: 38 minutes  AGREEMENTS SECTION    Overall Goal(s): Lose approximately 30 lbs. over the course of 3 months by improving healthy eating habits.   Patient will track his weight loss over time for measurement against his weight loss goal.                       Agreement/Action Steps:  Improve healthy eating habits Read food labels to monitor sodium intake Aim for 1500mg  of sodium per day Read food labels to monitor daily added sugar intake Aim for 38 grams of added sugar per day Practice portion Read food label for recommended serving size Utilize the diabetes plate method Refrain from eating seconds Drink water after eating Refrain from eating/snacking after 7pm Track nutrition with Fitbit App     Progress Notes:  Patient reported that his current weight is 250 lbs, which is down from 265 lbs on 05/24/23. Patient expressed that he didn't have the best two weeks working on his health goal. Patient mentioned that he did exercise one week for at least 4 days for 20 minutes each time. Patient stated that he felt compelled to do something.   Patient shared that he still has a challenge with snacking after dinner  Patient stated that he feels that his antidepressant may not be working. He shared that he continues to lack energy and motivation, which is affecting how often he exercise and what he is choosing to eat.    Indicators of Success and Accountability: Patient reported that his current weight is 250 lbs. Patient began to incorporate exercise.  Readiness: Patient is in the action stage of improving healthy eating habits.  Strengths and Supports: Patient is being supported by his wife. Patient   Challenges and Barriers: Patient shared that his challenge is being in the right frame of mind to stay consistent.     Coaching Outcomes: Summarize only what  is needed to help you and the patient with their progress.    Overall Goal(s): Lose approximately 30 lbs. over the course of 3 months by improving healthy eating habits and increasing exercise to 150 minutes per week.                 Patient will track his weight loss over time for measurement against his weight loss goal. Agreement/Action Steps:  Improve healthy eating habits Prep vegetables for snack and store in refrigerator at eye level Practice portion Read food label for recommended serving size Refrain from eating seconds Refrain from eating/snacking after 7pm Drink water or protein shake Track nutrition with Fitbit App daily (including dinner)  Increase exercise Exercise 4-5 days/week for 20 minutes Use elliptical for 15 minutes Perform exercise with stretch bands for 5 minutes    Attempted: Fulfilled - patient completed the bi-weekly agreement in full and was able to meet the challenge  Partial - patient did something towards the agreement- took a shot yet was unable to fully complete. E.g., agreement to exercise was 3X this week, but was only to the gym 1 time. Patient did more than what they previously did  Not met - patient did not attempt the agreement and did not express concern   Not attempted: Deferred - patient expressed the agreement  is important but unable to work towards this week and want to continue on the path with this agreement. i.e., deferring to another time, or patient determined not an appropriate time to try due to personal schedule, needs, priorities, etc.   Dropped/Revised - patient determined this was not the best endeavor and decided to revise the agreement for a better fit or to drop    Referrals: N/A

## 2023-07-12 DIAGNOSIS — Z0001 Encounter for general adult medical examination with abnormal findings: Secondary | ICD-10-CM | POA: Diagnosis not present

## 2023-07-12 DIAGNOSIS — B356 Tinea cruris: Secondary | ICD-10-CM | POA: Diagnosis not present

## 2023-07-12 DIAGNOSIS — I129 Hypertensive chronic kidney disease with stage 1 through stage 4 chronic kidney disease, or unspecified chronic kidney disease: Secondary | ICD-10-CM | POA: Diagnosis not present

## 2023-07-12 DIAGNOSIS — G4733 Obstructive sleep apnea (adult) (pediatric): Secondary | ICD-10-CM | POA: Diagnosis not present

## 2023-07-12 DIAGNOSIS — R5383 Other fatigue: Secondary | ICD-10-CM | POA: Diagnosis not present

## 2023-07-12 DIAGNOSIS — D509 Iron deficiency anemia, unspecified: Secondary | ICD-10-CM | POA: Diagnosis not present

## 2023-07-12 DIAGNOSIS — N182 Chronic kidney disease, stage 2 (mild): Secondary | ICD-10-CM | POA: Diagnosis not present

## 2023-07-12 DIAGNOSIS — L209 Atopic dermatitis, unspecified: Secondary | ICD-10-CM | POA: Diagnosis not present

## 2023-07-12 DIAGNOSIS — E782 Mixed hyperlipidemia: Secondary | ICD-10-CM | POA: Diagnosis not present

## 2023-07-12 DIAGNOSIS — R7303 Prediabetes: Secondary | ICD-10-CM | POA: Diagnosis not present

## 2023-07-12 DIAGNOSIS — I2510 Atherosclerotic heart disease of native coronary artery without angina pectoris: Secondary | ICD-10-CM | POA: Diagnosis not present

## 2023-07-22 ENCOUNTER — Ambulatory Visit: Payer: Medicare Other

## 2023-07-22 ENCOUNTER — Telehealth: Payer: Self-pay

## 2023-07-22 DIAGNOSIS — Z0000 Encounter for general adult medical examination without abnormal findings: Secondary | ICD-10-CM

## 2023-07-22 NOTE — Telephone Encounter (Signed)
Patient returned call and stated that he had some other issues to come up today and would like to reschedule his health coaching appt for Friday, 2/21 at 1:00pm over the phone. Patient has been rescheduled as requested and will be called at that time.   Renaee Munda, MS, ERHD, [facility]  Care Guide, Health & Wellness Coach [address] [place] [address] Telephone: [phone] Email: [email or web address]

## 2023-07-22 NOTE — Telephone Encounter (Signed)
Called patient after he left voicemail indicating that he needed to reschedule his health coaching appt today at 1:00pm. Patient did not answer. Left message for patient to return call to reschedule appt.   Renaee Munda, MS, ERHD, [facility]  Care Guide, Health & Wellness Coach [address], [address] [place] [address] Telephone: [phone] Email: [email or web address]

## 2023-07-22 NOTE — Progress Notes (Signed)
error 

## 2023-07-26 ENCOUNTER — Ambulatory Visit: Payer: Medicare Other

## 2023-07-26 VITALS — Wt 252.0 lb

## 2023-07-26 DIAGNOSIS — Z0000 Encounter for general adult medical examination without abnormal findings: Secondary | ICD-10-CM

## 2023-07-26 NOTE — Progress Notes (Signed)
 Appointment Outcome: Completed, Session #: 4                         Start time: 1:04pm  End time: 1:39pm  Total Mins: 35 minutes  AGREEMENTS SECTION   Overall Goal(s): Lose approximately 30 lbs. over the course of 3 months by improving healthy eating habits. Patient will track his weight loss over time for measurement against his weight loss goal.                       Agreement/Action Steps:  Improve healthy eating habits Read food labels to monitor sodium intake Aim for 1500mg  of sodium per day Read food labels to monitor daily added sugar intake Aim for 38 grams of added sugar per day Practice portion control Read food label for recommended serving size Utilize the diabetes plate method Refrain from eating seconds Drink water after eating Refrain from eating/snacking after 7pm Track nutrition with Fitbit App   Progress Notes:  Patient stated that he has been drinking a protein shake first thing in the morning to hold him over until about 2pm or 3pm when he would eat a kind bar at that time. Patient mentioned that he would eat dinner around 6pm or 7pm. Patient stated that he noticed he tends to eat more at dinner such as 3 slices of medium pizza at one sitting or an average serving of whole wheat spaghetti but goes back for more later. Patient stated that because he goes to bed late, he finds himself snacking out of boredom between 10-11pm. Patient shared that he did well with snacking healthy at night with consuming vegetables until he ran out and he ate whatever was available, such as his wife's pop tarts.  Patient stated that he knows that he needs to exercise more discipline. Patient recalled that when he lost weight previously, he was eating every 3 hours and ate smaller portions. Patient stated that he would need to remind himself to do this again. Patient shared that he must be mindful to make healthier choices such as choosing sugar free fruit cups and practicing portion  control to consume less carbohydrates.  Patient reported that he has meet with his PCP and learned via labs that he has low testosterone that is contributing to his low energy levels, which could be influencing his food choices as well as his motivation to engage in exercise. Patient shared that he has been physically active by engaging in yard work, although that was not his focus over the past week. Patient reported that he was prescribed Wellbutrin and is waiting to determine if he will be prescribed further treatment.     Indicators of Success and Accountability: Patient continues to reflect on his health behaviors to determine where he needs to improve and what additional resources, he may need to be successful.  Readiness: Patient is in the action stage of improving his healthy eating behaviors.  Strengths and Supports: Patient is being supported by his wife. Patient is relying on being disciplined to make healthy choices.   Challenges and Barriers: Patient stated that his low energy level, boredom, and sleep schedule plays a role in his snacking habits and food choices.   Coaching Outcomes: Patient revised his health coaching agreement during this session.  After speaking with his PCP, he will continue to deter his focus on exercise until he is able to determine treatment for low energy levels.  Patient will  continue to take medication as prescribed by PCP to determine if it helps with energy and eating habits.  Patient has continued to defer his focus on his goal of increasing his exercise until further notice.    Overall Goal(s): Lose approximately 30 lbs. over the course of 3 months by improving healthy eating habits. Track weight loss over time for measurement against weight loss goal.                       Agreement/Action Steps:  Improve healthy eating habits Eat every 3 hours (remind self to eat - perhaps set an alarm if that helps) Be mindful of what I eat by making  healthy choices Eat small amounts by practicing practice portion control Read food label for recommended serving size Refrain from eating seconds Refrain from eating/snacking after 7pm Track nutrition with Fitbit App     Referrals: N/A

## 2023-08-08 NOTE — Progress Notes (Signed)
 Patient sent the following email requesting that schedule in-person health coaching session on 3/7 at 1:00pm be telephonic.  Can we do the phone tomorrow, I have to be here at house tomorrow Ray  Changed in appointment notes as a reminder to call patient at that time. Cannot change appointment type.   Renaee Munda, MS, ERHD, [facility]  Care Guide, Health & Wellness Coach [address] [place] [address] Telephone: [phone] Email: [email or web address]

## 2023-08-09 ENCOUNTER — Ambulatory Visit: Payer: Medicare Other | Attending: Internal Medicine

## 2023-08-09 DIAGNOSIS — Z0000 Encounter for general adult medical examination without abnormal findings: Secondary | ICD-10-CM

## 2023-08-09 NOTE — Progress Notes (Signed)
 Appointment Outcome: Completed, Session #: 5                         Start time: 1:03pm   End time: 1:36pm   Total Mins: 33 minutes  AGREEMENTS SECTION   Overall Goal(s): Lose approximately 30 lbs. over the course of 3 months by improving healthy eating habits. Track weight loss over time for measurement against weight loss goal.                       Agreement/Action Steps:  Improve healthy eating habits Eat every 3 hours (remind self to eat - perhaps set an alarm if that helps) Be mindful of what I eat by making healthy choices Eat small amounts by practicing practice portion control Read food label for recommended serving size Refrain from eating seconds Refrain from eating/snacking after 7pm Track nutrition with Fitbit App    Progress Notes:  Patient reported weighing in at 245 pounds today (total loss of 20 pounds since starting health coaching on 05/17/23). Patient stated that he is losing weight slower than expecting but is okay because he hasn't been able to exercise as much due to lack of energy. Patient stated that since the weather has been nicer, he has been doing some cleaning and walking around in the yard. Patient has also done mechanical maintenance on his transportation. Patient mentioned that helped him concentrate less on food.  Patient shared that he has changed how he has been eating by sticking to a schedule and eating smaller meals and focusing on including protein, yogurt, nuts, and fruit. Patient mentioned that he has incorporated protein shakes and about 60 ounces of water into his diet. Patient shared that he has reduced eating breakfast from a restaurant to 2-3xs/week now that is still low carb/high protein. Patient stated that he has been tracking his meals with the app to ensure that he is not consuming a lot of carbohydrates.  Patient stated that he is practicing portion control by counting out the serving size when he does have a snack that is not healthy  as fruit and raw vegetables such as cookies or potato chips. Patient mentioned that he has reduced eating such foods by more than half because he is eating more fruit for snacks in the evening. Patient reported that if he does have a snack, it is either before dinner and after 8 only twice per week and has worked to reduce this as a habit. Patient stated that he may have a healthy choice meal for dinner 2-3xs/week.    Coaching Outcomes: Patient stated that he wants to focus more on reducing his carbohydrates over the next two weeks. Patient will also focus on consuming healthy carbohydrates that are high in fiber.   Patient stated that since he has started a new medication, he feels he has more momentum during the day after sleeping better the night before. Patient is hopeful that he will be able to gain more energy to start exercising more.     Attempted: Fulfilled - Patient ate on a schedule very closely to every 3 hours, was mindful of making healthy choices, ate small amounts by practicing portion control, and tracking his nutrition with fitness app.  Partial - Patient has refrained from eating a snack after 7pm 10 out of 14 days.    Referrals: N/A  Resources: N/A

## 2023-08-22 ENCOUNTER — Other Ambulatory Visit: Payer: Self-pay | Admitting: Cardiology

## 2023-08-23 ENCOUNTER — Ambulatory Visit: Payer: Self-pay

## 2023-08-23 DIAGNOSIS — Z713 Dietary counseling and surveillance: Secondary | ICD-10-CM | POA: Diagnosis not present

## 2023-08-23 DIAGNOSIS — R891 Abnormal level of hormones in specimens from other organs, systems and tissues: Secondary | ICD-10-CM | POA: Diagnosis not present

## 2023-08-23 DIAGNOSIS — Z0000 Encounter for general adult medical examination without abnormal findings: Secondary | ICD-10-CM

## 2023-08-23 DIAGNOSIS — E782 Mixed hyperlipidemia: Secondary | ICD-10-CM | POA: Diagnosis not present

## 2023-08-23 DIAGNOSIS — E785 Hyperlipidemia, unspecified: Secondary | ICD-10-CM | POA: Diagnosis not present

## 2023-08-23 DIAGNOSIS — R5383 Other fatigue: Secondary | ICD-10-CM | POA: Diagnosis not present

## 2023-08-23 DIAGNOSIS — Z7182 Exercise counseling: Secondary | ICD-10-CM | POA: Diagnosis not present

## 2023-08-23 NOTE — Progress Notes (Signed)
 Appointment Outcome: Completed, Session #: 6                        Start time: 1:01pm   End time: 1:28pm   Total Mins: 27 minutes  AGREEMENTS SECTION   Overall Goal(s): Lose approximately 30 lbs. over the course of 3 months by improving healthy eating habits. Track weight loss over time for measurement against weight loss goal.                       Agreement/Action Steps:  Improve healthy eating habits Eat every 3 hours (remind self to eat - perhaps set an alarm if that helps) Be mindful of what I eat by making healthy choices Eat small amounts by practicing practice portion control Read food label for recommended serving size Refrain from eating seconds Refrain from eating/snacking after 7pm Track nutrition with Fitbit App    Progress Notes:  Patient reported that he did well with his eating habits and learned over the past two weeks that an extended period without eating leads to larger portions/unhealthy choices.  Patient used reflection to help make healthy food choices at social gathering.  Patient's typical snack consisted of green peppers, tomatoes, and sliced cucumbers, dried fruit, or nuts.  Patient made primarily healthy choices when eating out: salmon, baked sweet potato, and salad; [place] salad and grilled chicken.  Patient is engaged in some form of physical activity; lifting and walking for at least 1.5 to 2 hours almost daily except for bad weather days (e.g., rain).  Patient reports weight at 244 pounds as of 08/22/2023 for a total loss of 21 pounds in 3 months.   Coaching Outcomes: Patient will continue to use his app as a daily evaluation/accountability tool for his eating habits.  Patient will incorporate other resources (e.g., books) to aid in helping him make healthy food choices.  Patient stated that he plans to keep his behavior changes small, simple, and sustainable for them to become a part of his lifestyle and continue to work towards his goal.    No changes were made to the patient's health coaching agreement/action steps during this appointment.  Patient completed 6-biweekly sessions and is scheduled for a final 54-month f/u for a maintenance check-in.     Attempted: Fulfilled - Patient completed the bi-weekly agreement in full and was able to meet the challenge.   Referrals: N/A

## 2023-09-10 ENCOUNTER — Other Ambulatory Visit: Payer: Self-pay | Admitting: Cardiology

## 2023-09-10 DIAGNOSIS — Z952 Presence of prosthetic heart valve: Secondary | ICD-10-CM

## 2023-09-17 ENCOUNTER — Telehealth: Payer: Self-pay | Admitting: Licensed Clinical Social Worker

## 2023-09-17 NOTE — Telephone Encounter (Signed)
 CSW contacted patient to inform that Devon Sanchez is no longer available to provide Health Coaching. Appointment cancelled for next week. CSW will send patient some resources and information on healthy eating. Aubry Blase, LCSW, CCSW-MCS [phone]

## 2023-09-20 ENCOUNTER — Ambulatory Visit: Payer: Self-pay

## 2024-01-02 DIAGNOSIS — E782 Mixed hyperlipidemia: Secondary | ICD-10-CM | POA: Diagnosis not present

## 2024-01-02 DIAGNOSIS — R7303 Prediabetes: Secondary | ICD-10-CM | POA: Diagnosis not present

## 2024-01-02 DIAGNOSIS — R891 Abnormal level of hormones in specimens from other organs, systems and tissues: Secondary | ICD-10-CM | POA: Diagnosis not present

## 2024-01-02 LAB — LAB REPORT - SCANNED
A1c: 6.1
Albumin, Urine POC: 22.8
Albumin/Creatinine Ratio, Urine, POC: 10
Creatinine, POC: 231.8 mg/dL
EGFR: 58

## 2024-01-09 DIAGNOSIS — Z952 Presence of prosthetic heart valve: Secondary | ICD-10-CM | POA: Diagnosis not present

## 2024-01-09 DIAGNOSIS — R891 Abnormal level of hormones in specimens from other organs, systems and tissues: Secondary | ICD-10-CM | POA: Diagnosis not present

## 2024-01-09 DIAGNOSIS — D509 Iron deficiency anemia, unspecified: Secondary | ICD-10-CM | POA: Diagnosis not present

## 2024-01-09 DIAGNOSIS — I712 Thoracic aortic aneurysm, without rupture, unspecified: Secondary | ICD-10-CM | POA: Diagnosis not present

## 2024-01-09 DIAGNOSIS — R7303 Prediabetes: Secondary | ICD-10-CM | POA: Diagnosis not present

## 2024-01-09 DIAGNOSIS — E782 Mixed hyperlipidemia: Secondary | ICD-10-CM | POA: Diagnosis not present

## 2024-01-09 DIAGNOSIS — N182 Chronic kidney disease, stage 2 (mild): Secondary | ICD-10-CM | POA: Diagnosis not present

## 2024-01-09 DIAGNOSIS — I2510 Atherosclerotic heart disease of native coronary artery without angina pectoris: Secondary | ICD-10-CM | POA: Diagnosis not present

## 2024-01-09 DIAGNOSIS — K219 Gastro-esophageal reflux disease without esophagitis: Secondary | ICD-10-CM | POA: Diagnosis not present

## 2024-01-09 DIAGNOSIS — G4733 Obstructive sleep apnea (adult) (pediatric): Secondary | ICD-10-CM | POA: Diagnosis not present

## 2024-01-09 DIAGNOSIS — I129 Hypertensive chronic kidney disease with stage 1 through stage 4 chronic kidney disease, or unspecified chronic kidney disease: Secondary | ICD-10-CM | POA: Diagnosis not present

## 2024-02-27 DIAGNOSIS — Z9842 Cataract extraction status, left eye: Secondary | ICD-10-CM | POA: Diagnosis not present

## 2024-02-27 DIAGNOSIS — H52223 Regular astigmatism, bilateral: Secondary | ICD-10-CM | POA: Diagnosis not present

## 2024-02-27 DIAGNOSIS — Z9841 Cataract extraction status, right eye: Secondary | ICD-10-CM | POA: Diagnosis not present

## 2024-03-08 ENCOUNTER — Other Ambulatory Visit: Payer: Self-pay | Admitting: Cardiology

## 2024-03-08 DIAGNOSIS — Z952 Presence of prosthetic heart valve: Secondary | ICD-10-CM

## 2024-03-24 ENCOUNTER — Other Ambulatory Visit: Payer: Self-pay | Admitting: Cardiology

## 2024-03-24 DIAGNOSIS — Z952 Presence of prosthetic heart valve: Secondary | ICD-10-CM

## 2024-04-15 ENCOUNTER — Other Ambulatory Visit: Payer: Self-pay | Admitting: Cardiology

## 2024-04-15 DIAGNOSIS — Z952 Presence of prosthetic heart valve: Secondary | ICD-10-CM

## 2024-05-08 ENCOUNTER — Telehealth (HOSPITAL_BASED_OUTPATIENT_CLINIC_OR_DEPARTMENT_OTHER): Payer: Self-pay | Admitting: *Deleted

## 2024-05-08 NOTE — Telephone Encounter (Signed)
   Pre-operative Risk Assessment    Patient Name: Devon Sanchez  DOB: [birth year] MRN: [id number]   Date of last office visit: 05/14/2023 Date of next office visit: None  Request for Surgical Clearance    Procedure:  Colonoscopy  Date of Surgery:  Clearance 06/23/24                                 Surgeon:  Dr. Layla Lah Surgeon's Group or Practice Name:  [facility] Phone number:  [phone] Fax number:  [phone]   Type of Clearance Requested:   - Medical  - Pharmacy:  Hold Aspirin  2 days prior   Type of Anesthesia:  propofol    Additional requests/questions:    Signed, Edsel Grayce Sanders   05/08/2024, 2:35 PM

## 2024-05-08 NOTE — Telephone Encounter (Signed)
 Primary Cardiologist:Jay Ladona, MD  Chart reviewed as part of pre-operative protocol coverage. Because of Devon Sanchez's past medical history and time since last visit, he/she will require a follow-up visit in order to better assess preoperative cardiovascular risk.  Pre-op covering staff: - Please schedule appointment and call patient to inform them. - Please contact requesting surgeon's office via preferred method (i.e, phone, fax) to inform them of need for appointment prior to surgery.  Request to hold aspirin  will be addressed at the upcoming office visit.  Rosaline EMERSON Bane, NP-C  05/08/2024, 3:47 PM [address] [place], [place] [address] Office [phone] Fax [phone]

## 2024-05-11 NOTE — Telephone Encounter (Signed)
 Appt made for 06/11/24 With C Cleaver

## 2024-05-11 NOTE — Telephone Encounter (Signed)
 Left message to call back and scheduled appt in office for preop clearance.

## 2024-06-08 ENCOUNTER — Other Ambulatory Visit: Payer: Self-pay | Admitting: Cardiology

## 2024-06-08 DIAGNOSIS — Z952 Presence of prosthetic heart valve: Secondary | ICD-10-CM

## 2024-06-10 NOTE — Progress Notes (Signed)
 " Cardiology Office Note   Date:  06/11/2024  ID:  Devon, Sanchez May 17, 1951, MRN [id number] PCP: Shona Norleen PEDLAR, MD  [facility] Providers Cardiologist:  Gordy Bergamo, MD     History of Present Illness Devon Sanchez is a 74 y.o. male with history of CAD s/p NSTEMI, aortic atherosclerosis and anuryesum s/p BENTALL and AVR, hypertension, obesity, hyperlipidemia, OSA on CPAP.    He under cardiac catheterization with stent to LAD in 2007 and again 04/2013 stenting to the proximal and mid LAD, and proximal to mid circumflex.  Cardiac catheterization 11/2019; LM normal, patent proximal LAD stent with 20% late luminal loss at proximal edge, LCx patent proximal stent, ramus normal, and RCA 95% stenosis in small RV marginal branch, unchanged compared to previous angiogram in 2014.  Normal right heart cath with no pulmonary hypertension.  Echo 07/31/2019 LVEF 64%, normal global wall motion, grade 1 DD, severe calcification of noncoronary cusp, moderate aortic stenosis with valve gradient of 25 mmHg, trace aortic regurgitation, aortic valve area 1.2 cm and ascending aortic aneurysm 5.7 cm. 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  Echo 06/29/2021 LVEF 60 to 65%, normal global wall motion, grade 1 DD, s/p Bentall procedure-aortic root and proximal ascending aorta are mildly dilated at 39 mm, with no bioprosthetic valve abnormalities. Carotid duplex 06/29/2021; minimal bilateral ICA stenosis with recommendations for no follow-up studies unless clinically needed.  He was last seen by Dr. Bergamo 05/14/2023 and appeared to be stable from a cardiac standpoint.    He presents today for preop cardiac clearance for colonoscopy 06/23/2024. 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 He does note some dizziness upon standing up too fast. He is hopeful to start doing  more walking in the new year. He denies chest pain, shortness of breath, lower extremity edema, fatigue, palpitations, melena, hematuria, hemoptysis, diaphoresis, weakness, presyncope, syncope, orthopnea, and PND.  ROS: All systems negative unless otherwise indicated in HPI.   Studies Reviewed      Cardiac Studies & Procedures   ______________________________________________________________________________________________ CARDIAC CATHETERIZATION  CARDIAC CATHETERIZATION 11/10/2019  Conclusion LM: Normal LAD: Patent prox LAD stent with 20% late lumen loss at proximal edge LCx: Patent prox stent Ramus: Normal RCA: 95% stenosis in small RV marginal branch, unchanged compared to previous angiogram in 2014  Normal RHC. No pulmonary hypertension  Findings Coronary Findings Diagnostic  Dominance: Right  Left Main Vessel is normal in caliber. Vessel is angiographically normal.  Left Anterior Descending Prox LAD lesion is 20% stenosed. The lesion was previously treated. 20% late lumen loss at proximal edge of prox LAD stent  Left Circumflex Previously placed Prox Cx stent (unknown type) is widely patent.  Right Coronary Artery Vessel is normal in caliber. Following branch could not be shown on the angiogram Small branch originating from prox RCA, running the course of RV marginal. This vessel has ostial 95% stenosis. This appears unchanged compared to prior angiogram in 2014.  Intervention  No interventions have been documented.     ECHOCARDIOGRAM  PCV ECHOCARDIOGRAM COMPLETE 06/29/2021  Narrative Echocardiogram 06/29/2021: Normal LV systolic function with visual EF 60-65%. Left ventricle cavity is normal in size. Normal left ventricular wall thickness. Normal global wall motion. Normal diastolic filling pattern, normal LAP. S/P Bentall procedure  Bioprosthetic valve is well-seated, no evidence of dehiscence, and no significant perivalvular regurgitation. No evidence of  valvular stenosis or regurgitation. Mild pulmonic regurgitation. S/P Bentall procedure - aortic root and proximal ascending aorta are mildly dilated 39mm. Compared to study 07/14/2020 LVEF remains preserved, G1DD is now normal, Ao root was 35mm and now 39mm.   TEE  ECHO INTRAOPERATIVE TEE 05/12/2020  Narrative *INTRAOPERATIVE TRANSESOPHAGEAL REPORT *    Patient Name:   Devon Sanchez Date of Exam: 05/12/2020 Medical Rec #:  [id number]        Height:       74.0 in Accession #:    [id number]       Weight:       253.4 lb Date of Birth:  1950-12-25         BSA:          2.40 m Patient Age:    69 years         BP:           105/69 mmHg Patient Gender: M                HR:           93 bpm. Exam Location:  Anesthesiology  Transesophogeal exam was perform intraoperatively during surgical procedure. Patient was closely monitored under general anesthesia during the entirety of examination.  Indications:     Aortic valve stenosis, etiology of cardiac valve disease unspecified [I35.0 (ICD-10-CM)]; Ascending aortic aneurysm (HCC) [I71.2 (ICD-10-CM)] Performing Phys: [id number] PETER VAN TRIGT Diagnosing Phys: Allena Seip MD  POST-OP IMPRESSIONS - Left Ventricle: Post Bentall Procedure The patient came off bypass on the initial attempt. There did not appear to be any obstruction to aortic valve flow. Left ventricular function did improve with volume replacement. The TEE that had been placed uneventfully after induction was removed at the end of the case without difficulty, The patient was later taken to the SICU in stable condition.  PRE-OP FINDINGS Left Ventricle: The left ventricle has low normal systolic function, with an ejection fraction of 50-55%.  Right Ventricle: The right ventricle has normal systolic function.  Left Atrium: The left atrial appendage is well visualized and there is no evidence of thrombus present.  Mitral Valve: The mitral valve is normal in structure. Mitral  valve regurgitation is trivial by color flow Doppler.  Aortic Valve: There is moderate calcification of the aortic valve Aortic valve regurgitation is trivial by color flow Doppler. There is moderate stenosis of the aortic valve.   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  EXAM: Cardiac/Coronary  CTA  TECHNIQUE: The patient was scanned on a [employer].  FINDINGS: A 100 kV prospective scan was triggered in the descending thoracic aorta at 111 HU's. Axial non-contrast 3 mm slices were carried out through the heart. The data set was analyzed on a dedicated work station and scored using the Agatson method. Gantry rotation speed was 250 msecs and collimation was .6 mm. 50 mg of PO Metoprolol  and 0.8 mg of sl NTG was given. The 3D data set was reconstructed in 5% intervals of the 67-82 % of the R-R cycle. Diastolic phases were analyzed on a dedicated work station using MPR, MIP  and VRT modes. The patient received 80 cc of contrast.  Aorta: Severely dilated ascending aortic aneurysm with maximum diameter 52 mm. There are moderate diffuse calcifications and atherosclerotic plaque, no dissection.  Sinotubular Junction: 40 x 39 mm  Ascending Thoracic Aorta: 52 x 52 mm  Aortic Arch: 32 x 31 mm  Descending Thoracic Aorta: 31 x 29 mm  Aortic Valve: Bileaflet with severely calcified leaflet with severely restricted leaflet opening. No calcifications.  Sinus of Valsalva Measurements: 42 x 35 mm  Coronary Arteries:  Normal coronary origin.  Right dominance.  RCA  is a large dominant artery that gives rise to PDA and PLA. There is minimal mixed plaque in the mid segment with stenosis 0-25%.  Left main is a large artery that gives rise to LAD, ramus intermedius and LCX arteries. Left main has minimal eccentric calcified plaque in the distal portion with stenosis 0-25%.  LAD is a large vessel that has a long stent in the entire proximal portion. The stent is clearly patent with minimal irregularities in its proximal portion. Mid and distal LAD has minimal irregularities.  Ramus intermedius is a medium caliber artery that has mild calcified plaque in the proximal portion with stenosis 25-49%.  LCX is a non-dominant artery that has a stent in the proximal portion. Proximal LAD stent is patent with minimal irregularities. Mid and distal LCX artery has small lumen and no obvious plaque.  Other findings:  Normal pulmonary vein drainage into the left atrium.  Normal left atrial appendage without a thrombus.  Normal size of the pulmonary artery.  IMPRESSION: 1. Normal coronary origin with right dominance.  2. Patent stents in the proximal LAD and proximal portion of LCX arteries with only minimal in-stent restenosis. Otherwise mild diffuse non-obstructive CAD.  3. Aorta: Severely dilated ascending aortic aneurysm with maximum diameter 52 mm. There are moderate diffuse calcifications and atherosclerotic plaque, no dissection. Normal size of the aortic arch and descending thoracic aorta.   Electronically Signed By: Leim Moose On: 04/10/2020 13:44  Narrative EXAM: OVER-READ INTERPRETATION  CT CHEST  The following report is an over-read performed by radiologist Dr. Toribio Aye of [facility] on 04/08/2020. This over-read does not include interpretation of cardiac or coronary anatomy or pathology. The coronary calcium  score/coronary CTA interpretation by the cardiologist is attached.  COMPARISON:   None.  FINDINGS: Aortic atherosclerosis with aneurysmal dilatation of the ascending thoracic aorta measuring 5.2 cm in diameter. Moderate to large hiatal hernia. Within the visualized portions of the thorax there are no suspicious appearing pulmonary nodules or masses, there is no acute consolidative airspace disease, no pleural effusions, no pneumothorax and no lymphadenopathy. Visualized portions of the upper abdomen are unremarkable. There are no aggressive appearing lytic or blastic lesions noted in the visualized portions of the skeleton.  IMPRESSION: 1. Aortic atherosclerosis with aneurysmal dilatation of the ascending thoracic aorta (5.2 cm in diameter). Ascending thoracic aortic aneurysm. Recommend semi-annual imaging followup by CTA or MRA and referral to cardiothoracic surgery if not already obtained. This recommendation follows 2010 ACCF/AHA/AATS/ACR/ASA/SCA/SCAI/SIR/STS/SVM Guidelines for the Diagnosis and Management of Patients With Thoracic Aortic Disease. Circulation. 2010; 121: Z733-z630. Aortic aneurysm NOS (ICD10-I71.9). 2. Moderate to large hiatal hernia.  Electronically Signed: By: Toribio Aye M.D. On: 04/08/2020 13:57     ______________________________________________________________________________________________      Risk Assessment/Calculations           Physical Exam VS:  BP 128/74 (Cuff Size: Large)   Pulse 81   Ht 6'  1 (1.854 m)   Wt 265 lb (120.2 kg)   SpO2 95%   BMI 34.96 kg/m        Wt Readings from Last 3 Encounters:  06/11/24 265 lb (120.2 kg)  07/26/23 252 lb (114.3 kg)  07/05/23 250 lb (113.4 kg)    GEN: Well nourished, well developed in no acute distress NECK: No JVD; No carotid bruits CARDIAC: RRR, RBBB, no murmurs, rubs, gallops RESPIRATORY:  Clear to auscultation without rales, wheezing or rhonchi  ABDOMEN: Soft, non-tender, non-distended EXTREMITIES:  No edema; No deformity   ASSESSMENT AND PLAN  Pre- op  clearance- He is here today for pre op clearance in the setting of colonoscopy Per AHA/ACC guidelines, he is deemed acceptable risk for the planned procedure without additional cardiovascular testing. Will route to surgical team so they are aware.   According to the Revised Cardiac Risk Index (RCRI), his Perioperative Risk of Major Cardiac Event is (%): 0.9 His Functional Capacity in METs is: 5.19 according to the Duke Activity Status Index (DASI). Regarding ASA therapy, we recommend continuation of ASA throughout the perioperative period. However, if the surgeon feels that cessation of ASA is required in the perioperative period, it may be stopped 2 days prior to surgery with a plan to resume upon hemostasis in the post-operative period.   CAD s/p NSTEMI- Cardiac catheterization 11/2019; LM normal, patent proximal LAD stent with 20% late luminal loss at proximal edge, LCx patent proximal stent, ramus normal, and RCA 95% stenosis in small RV marginal branch, unchanged compared to previous angiogram in 2014. Stable with no anginal symptoms. No indication for ischemic evaluation.  Continue aspirin  325 mg, carvedilol  6.25 BID, and rosuvastatin  20 mg.   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 Continue aspirin  325 mg.   Hypertension- BP today 128/74. He rarely checks his BP at home, but says that when he does he gets numbers comparable to his BP today. Encouraged him to check BP once weekly. Discussed to monitor BP at home at least 2 hours after medications and sitting for 5-10 minutes.  Continue carvedilol  6.25 mg and losartan  25 mg.  Hyperlipidemia- Last LDL 51 on 01/02/24. Continue rosuvastatin  20 mg.        Dispo: Follow up with Dr. Ladona in 6 months.   Signed, Mardy KATHEE Pizza, FNP  "

## 2024-06-11 ENCOUNTER — Ambulatory Visit: Attending: General Practice

## 2024-06-11 VITALS — BP 128/74 | HR 81 | Ht 73.0 in | Wt 265.0 lb

## 2024-06-11 DIAGNOSIS — Z01818 Encounter for other preprocedural examination: Secondary | ICD-10-CM

## 2024-06-11 DIAGNOSIS — Z952 Presence of prosthetic heart valve: Secondary | ICD-10-CM | POA: Diagnosis not present

## 2024-06-11 DIAGNOSIS — I10 Essential (primary) hypertension: Secondary | ICD-10-CM | POA: Diagnosis not present

## 2024-06-11 DIAGNOSIS — I2510 Atherosclerotic heart disease of native coronary artery without angina pectoris: Secondary | ICD-10-CM | POA: Diagnosis not present

## 2024-06-11 DIAGNOSIS — E785 Hyperlipidemia, unspecified: Secondary | ICD-10-CM

## 2024-06-11 NOTE — Patient Instructions (Signed)
 Medication Instructions:  Your physician recommends that you continue on your current medications as directed. Please refer to the Current Medication list given to you today.  *If you need a refill on your cardiac medications before your next appointment, please call your pharmacy*  Lab Work: NONE ordered at this time of appointment   Testing/Procedures: NONE ordered at this time of appointment   Follow-Up: At [facility], you and your health needs are our priority.  As part of our continuing mission to provide you with exceptional heart care, our providers are all part of one team.  This team includes your primary Cardiologist (physician) and Advanced Practice Providers or APPs (Physician Assistants and Nurse Practitioners) who all work together to provide you with the care you need, when you need it.  Your next appointment:   5-6 month(s)  Provider:   Gordy Bergamo, MD    We recommend signing up for the patient portal called MyChart.  Sign up information is provided on this After Visit Summary.  MyChart is used to connect with patients for Virtual Visits (Telemedicine).  Patients are able to view lab/test results, encounter notes, upcoming appointments, etc.  Non-urgent messages can be sent to your provider as well.   To learn more about what you can do with MyChart, go to [email or web address].

## 2024-06-15 ENCOUNTER — Other Ambulatory Visit: Payer: Self-pay | Admitting: Cardiology
